# Patient Record
Sex: Male | Born: 1935
Health system: Southern US, Community
[De-identification: ages and names within clinical notes are randomized; demographics above are authoritative.]

## PROBLEM LIST (undated history)

## (undated) DIAGNOSIS — M1990 Unspecified osteoarthritis, unspecified site: Secondary | ICD-10-CM

## (undated) DIAGNOSIS — C801 Malignant (primary) neoplasm, unspecified: Secondary | ICD-10-CM

## (undated) DIAGNOSIS — R0602 Shortness of breath: Secondary | ICD-10-CM

## (undated) HISTORY — PX: APPENDECTOMY: SHX54

## (undated) HISTORY — PX: OTHER SURGICAL HISTORY: SHX169

## (undated) HISTORY — PX: NASAL SINUS SURGERY: SHX719

## (undated) HISTORY — PX: EYE SURGERY: SHX253

---

## 2003-09-26 ENCOUNTER — Ambulatory Visit (HOSPITAL_COMMUNITY): Admission: RE | Admit: 2003-09-26 | Discharge: 2003-09-26 | Payer: Self-pay | Admitting: Gastroenterology

## 2003-09-26 ENCOUNTER — Encounter (INDEPENDENT_AMBULATORY_CARE_PROVIDER_SITE_OTHER): Payer: Self-pay | Admitting: *Deleted

## 2010-04-08 ENCOUNTER — Encounter: Admission: RE | Admit: 2010-04-08 | Discharge: 2010-04-08 | Payer: Self-pay | Admitting: Orthopedic Surgery

## 2010-05-21 ENCOUNTER — Encounter: Admission: RE | Admit: 2010-05-21 | Discharge: 2010-05-21 | Payer: Self-pay | Admitting: Family Medicine

## 2010-05-21 ENCOUNTER — Observation Stay (HOSPITAL_COMMUNITY): Admission: EM | Admit: 2010-05-21 | Discharge: 2010-05-22 | Payer: Self-pay | Admitting: Emergency Medicine

## 2010-11-26 LAB — CBC
MCH: 33.2 pg (ref 26.0–34.0)
MCHC: 35.3 g/dL (ref 30.0–36.0)
RBC: 3.94 MIL/uL — ABNORMAL LOW (ref 4.22–5.81)

## 2010-11-26 LAB — BASIC METABOLIC PANEL
BUN: 16 mg/dL (ref 6–23)
CO2: 23 mEq/L (ref 19–32)
Calcium: 8.5 mg/dL (ref 8.4–10.5)
Chloride: 101 mEq/L (ref 96–112)
Glucose, Bld: 99 mg/dL (ref 70–99)
Potassium: 4.1 mEq/L (ref 3.5–5.1)
Sodium: 131 mEq/L — ABNORMAL LOW (ref 135–145)

## 2010-11-26 LAB — DIFFERENTIAL
Basophils Relative: 0 % (ref 0–1)
Eosinophils Relative: 1 % (ref 0–5)
Monocytes Absolute: 1 10*3/uL (ref 0.1–1.0)

## 2011-01-07 ENCOUNTER — Other Ambulatory Visit: Payer: Self-pay | Admitting: Sports Medicine

## 2011-01-07 DIAGNOSIS — M169 Osteoarthritis of hip, unspecified: Secondary | ICD-10-CM

## 2011-01-08 ENCOUNTER — Ambulatory Visit
Admission: RE | Admit: 2011-01-08 | Discharge: 2011-01-08 | Disposition: A | Discharge status: Home / Self Care | Payer: Medicare PPO | Source: Ambulatory Visit | Attending: Sports Medicine | Admitting: Sports Medicine

## 2011-01-08 DIAGNOSIS — M169 Osteoarthritis of hip, unspecified: Secondary | ICD-10-CM

## 2011-01-29 NOTE — Op Note (Signed)
NAME:  Mark Howe, Mark Howe                           ACCOUNT NO.:  [id number]   MEDICAL RECORD NO.:  [id number]                   PATIENT TYPE:  AMB   LOCATION:  ENDO                                 FACILITY:  [facility]   PHYSICIAN:  Graylin Shiver, M.D.                DATE OF BIRTH:  06-Jun-1935   DATE OF PROCEDURE:  09/26/2003  DATE OF DISCHARGE:                                 OPERATIVE REPORT   INDICATIONS FOR PROCEDURE:  Screening.   PROCEDURE:  Colonoscopy with polypectomy.   ENDOSCOPIST:  Graylin Shiver, M.D.   Informed consent was obtained after an explanation of the risks of the  procedure and preparation.   PREMEDICATION:  Fentanyl 50 mcg IV and Versed 6 mg IV.   PROCEDURE:  With the patient in the left lateral decubitus position a rectal  exam was performed.  No masses were felt.  The Olympus colonoscope was  inserted into the rectum and advanced around the colon to the cecum.  The  cecal landmarks were identified.  The cecum and ascending colon were normal.  The transverse colon was normal.  In the distal descending colon there was a  6 mm sessile polyp which was snared with the mini snare and removed by snare  cautery technique.  The cautery site looked good.  The polyp was retrieved.  The sigmoid and rectum looked normal.  He tolerated the procedure well  without complications.   IMPRESSION:  Small polyp in the descending colon.   PLAN:  Pathology will be checked - diagnosis code 211.3.                                               Graylin Shiver, M.D.    SFG/MEDQ  D:  09/26/2003  T:  09/26/2003  Job:  [id number]   cc:   Duncan Dull, M.D.  [address]  [place]  [place] [address]  Fax: [phone]

## 2011-05-24 ENCOUNTER — Other Ambulatory Visit: Payer: Self-pay | Admitting: Orthopedic Surgery

## 2011-05-24 DIAGNOSIS — M25552 Pain in left hip: Secondary | ICD-10-CM

## 2011-05-25 ENCOUNTER — Ambulatory Visit
Admission: RE | Admit: 2011-05-25 | Discharge: 2011-05-25 | Disposition: A | Discharge status: Home / Self Care | Payer: Medicare PPO | Source: Ambulatory Visit | Attending: Orthopedic Surgery | Admitting: Orthopedic Surgery

## 2011-05-25 DIAGNOSIS — M25552 Pain in left hip: Secondary | ICD-10-CM

## 2011-05-25 MED ORDER — IOHEXOL 180 MG/ML  SOLN: 1.0000 mL | Freq: Once | INTRAMUSCULAR | Status: AC | PRN

## 2011-05-25 MED ORDER — METHYLPREDNISOLONE ACETATE 40 MG/ML INJ SUSP (RADIOLOG: 120.0000 mg | Freq: Once | INTRAMUSCULAR | Status: DC

## 2011-11-16 NOTE — H&P (Signed)
  [facility] [address]   [address] [place], [place] [address] [phone] A Division of [facility]  Loreta Ave, M.D.     Robert A. Thurston Hole, M.D.     Lunette Stands, M.D. Eulas Post, M.D.    Buford Dresser, M.D. Estell Harpin, M.D. Ralene Cork, D.O.          Genene Churn. Barry Dienes, PA-C            Kirstin A. Shepperson, PA-C [place], [place]   RE: Trice, [place]   [id number]      DOB: 1935-01-29 PROGRESS NOTE: 11-12-11 Chief complaint: left hip pain. 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 States hip symptoms unchanged from previous visit. He's wanting to proceed with total hip replacement as scheduled.  Current medications: aspirin vitamin D, fish oil prostate health capsule Flomax Glucosamine. No known drug allergies. Past medical/surgical history: psoriasis allergic rhinitis colonic polyps BPH. Social history: patient quit smoking 2011. Denies alcohol consumption. Is currently employed doing lawn work. Single. Review of systems: denies cardiac pulmonary GI GU or neural issues. Denies fevers chills.  EXAMINATION: Height 5'10" weight 200 pounds. Respirations 20 blood pressure 158/80 pulse 63 temp 97.9. Alert and oriented x3 in no acute distress. Head is normal cephalic atraumatic. PERRLA and EOMI.. Lungs CTA bilaterally. No wheezes noted. Heart regular rate and rhythm S1 S2. No murmurs. Abdomen round non-distended. NABS x4. Soft non-tender. Left hip has 5-10 degrees internal rotation/external rotation with marked discomfort. Calf non-tender neurovascularly intact. Skin warm and dry.  IMPRESSION: End stage degenerative joint disease left hip and chronic pain. Failed conservative treatment.  PLAN: Will proceed with total hip replacement as scheduled. He will go to a rehab center post-op. He has information for this. All  question answered.  Loreta Ave, M.D.  Electronically verified by Loreta Ave, M.D. DFM(JMO):kh D 11-15-01 T 11-15-01

## 2011-11-19 ENCOUNTER — Encounter (HOSPITAL_COMMUNITY)
Admission: RE | Admit: 2011-11-19 | Discharge: 2011-11-19 | Disposition: A | Discharge status: Home / Self Care | Payer: Medicare Other | Source: Ambulatory Visit | Attending: Orthopedic Surgery | Admitting: Orthopedic Surgery

## 2011-11-19 ENCOUNTER — Other Ambulatory Visit: Payer: Self-pay

## 2011-11-19 ENCOUNTER — Ambulatory Visit (HOSPITAL_COMMUNITY)
Admission: RE | Admit: 2011-11-19 | Discharge: 2011-11-19 | Disposition: A | Discharge status: Home / Self Care | Payer: Medicare Other | Source: Ambulatory Visit | Attending: Surgery | Admitting: Surgery

## 2011-11-19 ENCOUNTER — Encounter (HOSPITAL_COMMUNITY): Payer: Self-pay

## 2011-11-19 DIAGNOSIS — Z01818 Encounter for other preprocedural examination: Secondary | ICD-10-CM | POA: Insufficient documentation

## 2011-11-19 DIAGNOSIS — Z01812 Encounter for preprocedural laboratory examination: Secondary | ICD-10-CM | POA: Insufficient documentation

## 2011-11-19 DIAGNOSIS — Z01810 Encounter for preprocedural cardiovascular examination: Secondary | ICD-10-CM | POA: Insufficient documentation

## 2011-11-19 HISTORY — DX: Unspecified osteoarthritis, unspecified site: M19.90

## 2011-11-19 HISTORY — DX: Malignant (primary) neoplasm, unspecified: C80.1

## 2011-11-19 HISTORY — DX: Shortness of breath: R06.02

## 2011-11-19 LAB — COMPREHENSIVE METABOLIC PANEL
AST: 17 U/L (ref 0–37)
Albumin: 3.9 g/dL (ref 3.5–5.2)
Alkaline Phosphatase: 47 U/L (ref 39–117)
Chloride: 102 mEq/L (ref 96–112)
Creatinine, Ser: 1.03 mg/dL (ref 0.50–1.35)
Potassium: 4.8 mEq/L (ref 3.5–5.1)
Total Bilirubin: 1 mg/dL (ref 0.3–1.2)
Total Protein: 7.7 g/dL (ref 6.0–8.3)

## 2011-11-19 LAB — URINALYSIS, ROUTINE W REFLEX MICROSCOPIC
Bilirubin Urine: NEGATIVE
Ketones, ur: NEGATIVE mg/dL
Nitrite: NEGATIVE
Protein, ur: 30 mg/dL — AB
Specific Gravity, Urine: 1.027 (ref 1.005–1.030)
Urobilinogen, UA: 1 mg/dL (ref 0.0–1.0)

## 2011-11-19 LAB — URINE MICROSCOPIC-ADD ON

## 2011-11-19 LAB — PROTIME-INR
INR: 1.05 (ref 0.00–1.49)
Prothrombin Time: 13.9 seconds (ref 11.6–15.2)

## 2011-11-19 LAB — ABO/RH: ABO/RH(D): A POS

## 2011-11-19 LAB — CBC
MCHC: 33.1 g/dL (ref 30.0–36.0)
Platelets: 194 10*3/uL (ref 150–400)
RDW: 12.2 % (ref 11.5–15.5)
WBC: 5.2 10*3/uL (ref 4.0–10.5)

## 2011-11-19 LAB — APTT: aPTT: 33 seconds (ref 24–37)

## 2011-11-19 NOTE — Pre-Procedure Instructions (Signed)
20 Mark Howe  11/19/2011   Your procedure is scheduled on:  11/24/2011  Report to [facility] at 8:00 AM.  Call this number if you have problems the morning of surgery: [phone]   Remember:   Do not eat food:4 Hours before arrival.  May have clear liquids: up to 4 Hours before arrival.  Clear liquids include soda, tea, black coffee, apple or grape juice, broth.  Take these medicines the morning of surgery with A SIP OF WATER: NONE   Do not wear jewelry, make-up or nail polish.  Do not wear lotions, powders, or perfumes. You may wear deodorant.  Do not shave 48 hours prior to surgery.  Do not bring valuables to the hospital.  Contacts, dentures or bridgework may not be worn into surgery.  Leave suitcase in the car. After surgery it may be brought to your room.  For patients admitted to the hospital, checkout time is 11:00 AM the day of discharge.   Patients discharged the day of surgery will not be allowed to drive home.  Name and phone number of your driver: with family  Special Instructions: CHG Shower Use Special Wash: 1/2 bottle night before surgery and 1/2 bottle morning of surgery.   Please read over the following fact sheets that you were given: Pain Booklet, Coughing and Deep Breathing, Blood Transfusion Information, Total Joint Packet, MRSA Information and Surgical Site Infection Prevention

## 2011-11-19 NOTE — Progress Notes (Signed)
Chart given to anesth. For review of CXR

## 2011-11-22 ENCOUNTER — Other Ambulatory Visit: Payer: Medicare Other

## 2011-11-22 ENCOUNTER — Other Ambulatory Visit: Payer: Self-pay | Admitting: Orthopedic Surgery

## 2011-11-22 ENCOUNTER — Ambulatory Visit
Admission: RE | Admit: 2011-11-22 | Discharge: 2011-11-22 | Disposition: A | Discharge status: Home / Self Care | Payer: Medicare Other | Source: Ambulatory Visit | Attending: Orthopedic Surgery | Admitting: Orthopedic Surgery

## 2011-11-22 DIAGNOSIS — R9389 Abnormal findings on diagnostic imaging of other specified body structures: Secondary | ICD-10-CM

## 2011-11-22 MED ORDER — IOHEXOL 300 MG/ML  SOLN
75.0000 mL | Freq: Once | INTRAMUSCULAR | Status: AC | PRN
  Administered 2011-11-22: 75 mL via INTRAVENOUS

## 2011-11-22 NOTE — Consult Note (Addendum)
Anesthesia:  Patient is a 77 year old male scheduled for a left THR on 11/24/11.  History includes SOB, former smoker, arthritis, skin cancer, prior nasal sinus surgery, and lap appendectomy in 2011.  Labs noted.  CXR on 11/19/11 showed Right suprahilar fullness cannot be definitively characterized. CT chest with contrast is recommended for further evaluation.  The Radiologist already notified Dr. Greig Right office, and patient is scheduled for a chest CT on 11/22/11.    EKG on 11/19/11 showed SB @ 57 bpm.  Will follow-up with CT scan results as available.  Addendum: 11/23/11 0940  Chest CT from yesterday showed: 1. The right suprahilar fullness on chest x-ray appears to be due to both a prominent azygos vein as well as mediastinal lipomatosis. No mass or adenopathy is seen.  2. Cardiomegaly.  Plan to proceed.

## 2011-11-23 ENCOUNTER — Other Ambulatory Visit: Payer: Medicare Other

## 2011-11-23 MED ORDER — CEFAZOLIN SODIUM-DEXTROSE 2-3 GM-% IV SOLR
2.0000 g | INTRAVENOUS | Status: AC
  Administered 2011-11-24: 2 g via INTRAVENOUS

## 2011-11-24 ENCOUNTER — Encounter (HOSPITAL_COMMUNITY): Payer: Self-pay | Admitting: Surgery

## 2011-11-24 ENCOUNTER — Encounter (HOSPITAL_COMMUNITY): Payer: Self-pay | Admitting: Vascular Surgery

## 2011-11-24 ENCOUNTER — Ambulatory Visit (HOSPITAL_COMMUNITY): Payer: Medicare Other

## 2011-11-24 ENCOUNTER — Encounter (HOSPITAL_COMMUNITY): Payer: Self-pay | Admitting: *Deleted

## 2011-11-24 ENCOUNTER — Encounter (HOSPITAL_COMMUNITY)
Admission: RE | Disposition: A | Discharge status: Skilled Nursing Facility | Payer: Self-pay | Source: Ambulatory Visit | Attending: Orthopedic Surgery

## 2011-11-24 ENCOUNTER — Ambulatory Visit (HOSPITAL_COMMUNITY): Payer: Medicare Other | Admitting: Vascular Surgery

## 2011-11-24 ENCOUNTER — Inpatient Hospital Stay (HOSPITAL_COMMUNITY)
Admission: RE | Admit: 2011-11-24 | Discharge: 2011-11-27 | DRG: 470 | Disposition: A | Discharge status: Skilled Nursing Facility | Payer: Medicare Other | Source: Ambulatory Visit | Attending: Orthopedic Surgery | Admitting: Orthopedic Surgery

## 2011-11-24 DIAGNOSIS — Z9089 Acquired absence of other organs: Secondary | ICD-10-CM

## 2011-11-24 DIAGNOSIS — Z7901 Long term (current) use of anticoagulants: Secondary | ICD-10-CM

## 2011-11-24 DIAGNOSIS — M1610 Unilateral primary osteoarthritis, unspecified hip: Principal | ICD-10-CM | POA: Diagnosis present

## 2011-11-24 DIAGNOSIS — Z85820 Personal history of malignant melanoma of skin: Secondary | ICD-10-CM

## 2011-11-24 DIAGNOSIS — J9819 Other pulmonary collapse: Secondary | ICD-10-CM | POA: Diagnosis not present

## 2011-11-24 DIAGNOSIS — M169 Osteoarthritis of hip, unspecified: Principal | ICD-10-CM | POA: Diagnosis present

## 2011-11-24 DIAGNOSIS — Z01812 Encounter for preprocedural laboratory examination: Secondary | ICD-10-CM

## 2011-11-24 DIAGNOSIS — L408 Other psoriasis: Secondary | ICD-10-CM | POA: Diagnosis present

## 2011-11-24 DIAGNOSIS — J309 Allergic rhinitis, unspecified: Secondary | ICD-10-CM | POA: Diagnosis present

## 2011-11-24 DIAGNOSIS — I10 Essential (primary) hypertension: Secondary | ICD-10-CM | POA: Diagnosis present

## 2011-11-24 DIAGNOSIS — D126 Benign neoplasm of colon, unspecified: Secondary | ICD-10-CM | POA: Diagnosis present

## 2011-11-24 DIAGNOSIS — N400 Enlarged prostate without lower urinary tract symptoms: Secondary | ICD-10-CM | POA: Diagnosis present

## 2011-11-24 HISTORY — PX: TOTAL HIP ARTHROPLASTY: SHX124

## 2011-11-24 SURGERY — ARTHROPLASTY, HIP, TOTAL,POSTERIOR APPROACH
Anesthesia: General | Site: Hip | Laterality: Left | Wound class: Clean

## 2011-11-24 MED ORDER — ONDANSETRON HCL 4 MG/2ML IJ SOLN
4.0000 mg | Freq: Once | INTRAMUSCULAR | Status: DC | PRN
Start: 2011-11-24 — End: 2011-11-24

## 2011-11-24 MED ORDER — WARFARIN SODIUM 5 MG PO TABS
5.0000 mg | ORAL_TABLET | Freq: Once | ORAL | Status: DC
  Filled 2011-11-24: qty 1

## 2011-11-24 MED ORDER — SODIUM CHLORIDE 0.9 % IR SOLN
Status: DC | PRN
  Administered 2011-11-24: 1

## 2011-11-24 MED ORDER — MENTHOL 3 MG MT LOZG: 1.0000 | LOZENGE | OROMUCOSAL | Status: DC | PRN

## 2011-11-24 MED ORDER — CEFAZOLIN SODIUM 1-5 GM-% IV SOLN
INTRAVENOUS | Status: AC
  Filled 2011-11-24: qty 100

## 2011-11-24 MED ORDER — FENTANYL CITRATE 0.05 MG/ML IJ SOLN
INTRAMUSCULAR | Status: DC | PRN
  Administered 2011-11-24: 50 ug via INTRAVENOUS
  Administered 2011-11-24: 200 ug via INTRAVENOUS

## 2011-11-24 MED ORDER — ENOXAPARIN SODIUM 40 MG/0.4ML ~~LOC~~ SOLN
40.0000 mg | SUBCUTANEOUS | Status: DC
  Administered 2011-11-25 – 2011-11-27 (×3): 40 mg via SUBCUTANEOUS
  Filled 2011-11-24 (×4): qty 0.4

## 2011-11-24 MED ORDER — ONDANSETRON HCL 4 MG PO TABS: 4.0000 mg | ORAL_TABLET | Freq: Four times a day (QID) | ORAL | Status: DC | PRN

## 2011-11-24 MED ORDER — ONDANSETRON HCL 4 MG/2ML IJ SOLN
INTRAMUSCULAR | Status: DC | PRN
  Administered 2011-11-24: 4 mg via INTRAVENOUS

## 2011-11-24 MED ORDER — HETASTARCH-ELECTROLYTES 6 % IV SOLN
INTRAVENOUS | Status: DC | PRN
  Administered 2011-11-24: 11:00:00 via INTRAVENOUS

## 2011-11-24 MED ORDER — OXYCODONE-ACETAMINOPHEN 5-325 MG PO TABS
ORAL_TABLET | ORAL | Status: AC
  Filled 2011-11-24: qty 2

## 2011-11-24 MED ORDER — ACETAMINOPHEN 10 MG/ML IV SOLN
INTRAVENOUS | Status: AC
  Filled 2011-11-24: qty 100

## 2011-11-24 MED ORDER — LACTATED RINGERS IV SOLN
INTRAVENOUS | Status: DC
  Administered 2011-11-24: 10:00:00 via INTRAVENOUS

## 2011-11-24 MED ORDER — OXYCODONE-ACETAMINOPHEN 5-325 MG PO TABS
1.0000 | ORAL_TABLET | ORAL | Status: DC | PRN
  Administered 2011-11-24: 1 via ORAL
  Administered 2011-11-24: 2 via ORAL
  Filled 2011-11-24: qty 1

## 2011-11-24 MED ORDER — METHOCARBAMOL 500 MG PO TABS
500.0000 mg | ORAL_TABLET | Freq: Four times a day (QID) | ORAL | Status: DC | PRN
  Administered 2011-11-24: 500 mg via ORAL
  Filled 2011-11-24: qty 1

## 2011-11-24 MED ORDER — ACETAMINOPHEN 10 MG/ML IV SOLN
INTRAVENOUS | Status: DC | PRN
  Administered 2011-11-24: 1000 mg via INTRAVENOUS

## 2011-11-24 MED ORDER — POTASSIUM CHLORIDE IN NACL 20-0.9 MEQ/L-% IV SOLN
INTRAVENOUS | Status: DC
  Administered 2011-11-24 – 2011-11-25 (×2): via INTRAVENOUS
  Filled 2011-11-24 (×10): qty 1000

## 2011-11-24 MED ORDER — MORPHINE SULFATE 10 MG/ML IJ SOLN
INTRAMUSCULAR | Status: DC | PRN
  Administered 2011-11-24 (×2): 5 mg via INTRAVENOUS

## 2011-11-24 MED ORDER — CEFAZOLIN SODIUM 1-5 GM-% IV SOLN
1.0000 g | Freq: Three times a day (TID) | INTRAVENOUS | Status: AC
  Administered 2011-11-24 – 2011-11-25 (×3): 1 g via INTRAVENOUS
  Filled 2011-11-24 (×3): qty 50

## 2011-11-24 MED ORDER — HYDROMORPHONE HCL PF 1 MG/ML IJ SOLN
0.2500 mg | INTRAMUSCULAR | Status: DC | PRN
  Administered 2011-11-24: 0.5 mg via INTRAVENOUS
  Administered 2011-11-24 (×2): 0.25 mg via INTRAVENOUS

## 2011-11-24 MED ORDER — ONDANSETRON HCL 4 MG/2ML IJ SOLN: 4.0000 mg | Freq: Four times a day (QID) | INTRAMUSCULAR | Status: DC | PRN

## 2011-11-24 MED ORDER — LIDOCAINE HCL 1 % IJ SOLN
INTRAMUSCULAR | Status: DC | PRN
  Administered 2011-11-24: 100 mg via INTRADERMAL

## 2011-11-24 MED ORDER — METHOCARBAMOL 100 MG/ML IJ SOLN
500.0000 mg | Freq: Four times a day (QID) | INTRAVENOUS | Status: DC | PRN
  Filled 2011-11-24: qty 5

## 2011-11-24 MED ORDER — ARTIFICIAL TEARS OP OINT
TOPICAL_OINTMENT | OPHTHALMIC | Status: DC | PRN
  Administered 2011-11-24: 1 via OPHTHALMIC

## 2011-11-24 MED ORDER — PHENOL 1.4 % MT LIQD: 1.0000 | OROMUCOSAL | Status: DC | PRN

## 2011-11-24 MED ORDER — PROPOFOL 10 MG/ML IV EMUL
INTRAVENOUS | Status: DC | PRN
  Administered 2011-11-24: 200 mg via INTRAVENOUS

## 2011-11-24 MED ORDER — FLEET ENEMA 7-19 GM/118ML RE ENEM: 1.0000 | ENEMA | Freq: Once | RECTAL | Status: AC | PRN

## 2011-11-24 MED ORDER — LACTATED RINGERS IV SOLN
INTRAVENOUS | Status: DC | PRN
  Administered 2011-11-24 (×2): via INTRAVENOUS

## 2011-11-24 MED ORDER — ACETAMINOPHEN 325 MG PO TABS
650.0000 mg | ORAL_TABLET | Freq: Four times a day (QID) | ORAL | Status: DC | PRN
  Administered 2011-11-27: 650 mg via ORAL
  Filled 2011-11-24: qty 2

## 2011-11-24 MED ORDER — WARFARIN VIDEO: Freq: Once | Status: DC

## 2011-11-24 MED ORDER — TAMSULOSIN HCL 0.4 MG PO CAPS
0.4000 mg | ORAL_CAPSULE | Freq: Every day | ORAL | Status: DC
  Administered 2011-11-24 – 2011-11-27 (×4): 0.4 mg via ORAL
  Filled 2011-11-24 (×4): qty 1

## 2011-11-24 MED ORDER — SENNOSIDES-DOCUSATE SODIUM 8.6-50 MG PO TABS
1.0000 | ORAL_TABLET | Freq: Every evening | ORAL | Status: DC | PRN
  Administered 2011-11-26 (×2): 1 via ORAL
  Filled 2011-11-24 (×2): qty 1

## 2011-11-24 MED ORDER — WARFARIN - PHARMACIST DOSING INPATIENT
Freq: Every day | Status: DC
  Administered 2011-11-24 – 2011-11-26 (×2)

## 2011-11-24 MED ORDER — DOCUSATE SODIUM 100 MG PO CAPS
100.0000 mg | ORAL_CAPSULE | Freq: Two times a day (BID) | ORAL | Status: DC
  Administered 2011-11-24 – 2011-11-27 (×6): 100 mg via ORAL
  Filled 2011-11-24 (×7): qty 1

## 2011-11-24 MED ORDER — COUMADIN BOOK
Freq: Once | Status: AC
  Administered 2011-11-24: 15:00:00
  Filled 2011-11-24: qty 1

## 2011-11-24 MED ORDER — GLYCOPYRROLATE 0.2 MG/ML IJ SOLN
INTRAMUSCULAR | Status: DC | PRN
  Administered 2011-11-24: .6 mg via INTRAVENOUS
  Administered 2011-11-24: 0.2 mg via INTRAVENOUS

## 2011-11-24 MED ORDER — BUPIVACAINE HCL 0.25 % IJ SOLN
INTRAMUSCULAR | Status: DC | PRN
  Administered 2011-11-24: 20 mL

## 2011-11-24 MED ORDER — ROCURONIUM BROMIDE 100 MG/10ML IV SOLN
INTRAVENOUS | Status: DC | PRN
  Administered 2011-11-24: 50 mg via INTRAVENOUS

## 2011-11-24 MED ORDER — ACETAMINOPHEN 650 MG RE SUPP: 650.0000 mg | Freq: Four times a day (QID) | RECTAL | Status: DC | PRN

## 2011-11-24 MED ORDER — NEOSTIGMINE METHYLSULFATE 1 MG/ML IJ SOLN
INTRAMUSCULAR | Status: DC | PRN
  Administered 2011-11-24: 3 mg via INTRAVENOUS

## 2011-11-24 MED ORDER — HYDROMORPHONE HCL PF 1 MG/ML IJ SOLN: 0.5000 mg | INTRAMUSCULAR | Status: DC | PRN

## 2011-11-24 MED ORDER — EPHEDRINE SULFATE 50 MG/ML IJ SOLN
INTRAMUSCULAR | Status: DC | PRN
  Administered 2011-11-24 (×2): 10 mg via INTRAVENOUS
  Administered 2011-11-24: 5 mg via INTRAVENOUS

## 2011-11-24 SURGICAL SUPPLY — 63 items
BLADE SAW SAG 73X25 THK (BLADE) ×1
BLADE SAW SGTL 73X25 THK (BLADE) ×1 IMPLANT
BOOTCOVER CLEANROOM LRG (PROTECTIVE WEAR) ×4 IMPLANT
BRUSH FEMORAL CANAL (MISCELLANEOUS) IMPLANT
CLOTH BEACON ORANGE TIMEOUT ST (SAFETY) ×2 IMPLANT
COVER BACK TABLE 24X17X13 BIG (DRAPES) ×1 IMPLANT
COVER SURGICAL LIGHT HANDLE (MISCELLANEOUS) ×2 IMPLANT
DRAPE INCISE IOBAN 66X45 STRL (DRAPES) ×1 IMPLANT
DRAPE ORTHO SPLIT 77X108 STRL (DRAPES) ×4
DRAPE SURG ORHT 6 SPLT 77X108 (DRAPES) ×2 IMPLANT
DRAPE U-SHAPE 47X51 STRL (DRAPES) ×2 IMPLANT
DRILL BIT 7/64X5 (BIT) ×2 IMPLANT
DRSG PAD ABDOMINAL 8X10 ST (GAUZE/BANDAGES/DRESSINGS) ×3 IMPLANT
DURAPREP 26ML APPLICATOR (WOUND CARE) ×2 IMPLANT
ELECT BLADE 6.5 EXT (BLADE) ×1 IMPLANT
ELECT CAUTERY BLADE 6.4 (BLADE) ×2 IMPLANT
ELECT REM PT RETURN 9FT ADLT (ELECTROSURGICAL) ×2
ELECTRODE REM PT RTRN 9FT ADLT (ELECTROSURGICAL) ×1 IMPLANT
EVACUATOR 1/8 PVC DRAIN (DRAIN) IMPLANT
FACESHIELD LNG OPTICON STERILE (SAFETY) ×4 IMPLANT
GAUZE XEROFORM 1X8 LF (GAUZE/BANDAGES/DRESSINGS) ×1 IMPLANT
GAUZE XEROFORM 5X9 LF (GAUZE/BANDAGES/DRESSINGS) ×2 IMPLANT
GLOVE BIOGEL PI IND STRL 8 (GLOVE) ×1 IMPLANT
GLOVE BIOGEL PI INDICATOR 8 (GLOVE) ×1
GLOVE ORTHO TXT STRL SZ7.5 (GLOVE) ×4 IMPLANT
GOWN PREVENTION PLUS XLARGE (GOWN DISPOSABLE) ×2 IMPLANT
GOWN STRL NON-REIN LRG LVL3 (GOWN DISPOSABLE) ×2 IMPLANT
GOWN STRL REIN 2XL XLG LVL4 (GOWN DISPOSABLE) ×2 IMPLANT
HANDPIECE INTERPULSE COAX TIP (DISPOSABLE)
KIT BASIN OR (CUSTOM PROCEDURE TRAY) ×2 IMPLANT
KIT ROOM TURNOVER OR (KITS) ×2 IMPLANT
MANIFOLD NEPTUNE II (INSTRUMENTS) ×2 IMPLANT
NDL HYPO 25GX1X1/2 BEV (NEEDLE) ×1 IMPLANT
NEEDLE HYPO 25GX1X1/2 BEV (NEEDLE) ×2 IMPLANT
NS IRRIG 1000ML POUR BTL (IV SOLUTION) ×2 IMPLANT
PACK TOTAL JOINT (CUSTOM PROCEDURE TRAY) ×2 IMPLANT
PAD ARMBOARD 7.5X6 YLW CONV (MISCELLANEOUS) ×4 IMPLANT
PASSER SUT SWANSON 36MM LOOP (INSTRUMENTS) ×2 IMPLANT
PILLOW ABDUCTION HIP (SOFTGOODS) ×2 IMPLANT
PRESSURIZER FEMORAL UNIV (MISCELLANEOUS) IMPLANT
SET HNDPC FAN SPRY TIP SCT (DISPOSABLE) IMPLANT
SPONGE GAUZE 4X4 12PLY (GAUZE/BANDAGES/DRESSINGS) ×2 IMPLANT
SPONGE LAP 4X18 X RAY DECT (DISPOSABLE) IMPLANT
STAPLER VISISTAT 35W (STAPLE) ×2 IMPLANT
SUCTION FRAZIER TIP 10 FR DISP (SUCTIONS) ×2 IMPLANT
SUT FIBERWIRE #2 38 REV NDL BL (SUTURE) ×6
SUT VIC AB 1 CTX 36 (SUTURE) ×8
SUT VIC AB 1 CTX36XBRD ANBCTR (SUTURE) ×4 IMPLANT
SUT VIC AB 2-0 CT1 27 (SUTURE)
SUT VIC AB 2-0 CT1 TAPERPNT 27 (SUTURE) IMPLANT
SUT VIC AB 2-0 SH 27 (SUTURE) ×4
SUT VIC AB 2-0 SH 27XBRD (SUTURE) ×2 IMPLANT
SUT VIC AB 3-0 SH 27 (SUTURE)
SUT VIC AB 3-0 SH 27X BRD (SUTURE) IMPLANT
SUTURE FIBERWR#2 38 REV NDL BL (SUTURE) ×3 IMPLANT
SYR 30ML SLIP (SYRINGE) ×2 IMPLANT
SYR CONTROL 10ML LL (SYRINGE) ×2 IMPLANT
TAPE CLOTH SURG 4X10 WHT LF (GAUZE/BANDAGES/DRESSINGS) ×1 IMPLANT
TOWEL OR 17X24 6PK STRL BLUE (TOWEL DISPOSABLE) ×2 IMPLANT
TOWEL OR 17X26 10 PK STRL BLUE (TOWEL DISPOSABLE) ×2 IMPLANT
TOWER CARTRIDGE SMART MIX (DISPOSABLE) IMPLANT
TRAY FOLEY CATH 14FR (SET/KITS/TRAYS/PACK) ×2 IMPLANT
WATER STERILE IRR 1000ML POUR (IV SOLUTION) ×8 IMPLANT

## 2011-11-24 NOTE — Preoperative (Signed)
Beta Blockers   Reason not to administer Beta Blockers:Not Applicable 

## 2011-11-24 NOTE — Transfer of Care (Signed)
Immediate Anesthesia Transfer of Care Note  Patient: Mark Howe  Procedure(s) Performed: Procedure(s) (LRB): TOTAL HIP ARTHROPLASTY (Left)  Patient Location: PACU  Anesthesia Type: General  Level of Consciousness: awake, alert , oriented and patient cooperative  Airway & Oxygen Therapy: Patient Spontanous Breathing and Patient connected to nasal cannula oxygen  Post-op Assessment: Report given to PACU RN, Post -op Vital signs reviewed and stable and Patient moving all extremities X 4  Post vital signs: Reviewed and stable  Complications: No apparent anesthesia complications

## 2011-11-24 NOTE — Brief Op Note (Signed)
11/24/2011  12:00 PM  PATIENT:  Mark Howe  77 y.o. male  PRE-OPERATIVE DIAGNOSIS:  OA LEFT HIP  POST-OPERATIVE DIAGNOSIS:  OA LEFT HIP  PROCEDURE:  Procedure(s) (LRB): TOTAL HIP ARTHROPLASTY (Left)  SURGEON:  Surgeon(s) and Role:    * Loreta Ave, MD - Primary  PHYSICIAN ASSISTANT: Zonia Kief M  ANESTHESIA:   general  EBL:  Total I/O In: 1800 [I.V.:1300; IV Piggyback:500] Out: 575 [Urine:75; Blood:500]   SPECIMEN:  No Specimen  DISPOSITION OF SPECIMEN:  N/A  COUNTS:  YES   PATIENT DISPOSITION:  PACU - hemodynamically stable.

## 2011-11-24 NOTE — Interval H&P Note (Signed)
History and Physical Interval Note:  11/24/2011 8:35 AM  Beverely Pace Mascaro  has presented today for surgery, with the diagnosis of OA LEFT HIP  The various methods of treatment have been discussed with the patient and family. After consideration of risks, benefits and other options for treatment, the patient has consented to  Procedure(s) (LRB): TOTAL HIP ARTHROPLASTY (Left) as a surgical intervention .  The patients' history has been reviewed, patient examined, no change in status, stable for surgery.  I have reviewed the patients' chart and labs.  Questions were answered to the patient's satisfaction.     Wauneta Silveria F

## 2011-11-24 NOTE — Progress Notes (Signed)
ANTICOAGULATION CONSULT NOTE - Initial Consult  Pharmacy Consult for coumadin Indication: VTE prophylaxis; s/p L THA  No Known Allergies  Patient Measurements:  Vital Signs: Temp: 97.9 F (36.6 C) (03/13 1300) Temp src: Oral (03/13 0747) BP: 183/72 mmHg (03/13 0747) Pulse Rate: 70  (03/13 0747)  Labs: No results found for this basename: HGB:2,HCT:3,PLT:3,APTT:3,LABPROT:3,INR:3,HEPARINUNFRC:3,CREATININE:3,CKTOTAL:3,CKMB:3,TROPONINI:3 in the last 72 hours CrCl is unknown because there is no height on file for the current visit.  Medical History: Past Medical History  Diagnosis Date  . Shortness of breath   . Arthritis     L hip & R knee- OA  . Cancer     basal cell CA- face    Medications:  Prescriptions prior to admission  Medication Sig Dispense Refill  . aspirin EC 81 MG tablet Take 81 mg by mouth daily.      . fish oil-omega-3 fatty acids 1000 MG capsule Take 1 g by mouth daily.      . Tamsulosin HCl (FLOMAX) 0.4 MG CAPS Take 0.4 mg by mouth daily.        Assessment: 77 yo male s/p L THA to start coumadin. Baseline INR= 1.05 on 11/19/11.  Goal of Therapy:  INR 2-3   Plan:  -Will give 5mg  coumadin today -Daily PT/INR -Will begin education process  Benny Lennert 11/24/2011,2:32 PM

## 2011-11-24 NOTE — Plan of Care (Signed)
Problem: Consults Goal: Diagnosis- Total Joint Replacement Outcome: Completed/Met Date Met:  11/24/11 Primary Total Hip Left

## 2011-11-24 NOTE — Progress Notes (Signed)
Dressing saturated at this time. Pressure dressing and ice applied to L hip. Notified Zonia Kief PA of bleeding. Will continue to monitor bleeding.

## 2011-11-24 NOTE — Anesthesia Preprocedure Evaluation (Addendum)
Anesthesia Evaluation  Patient identified by MRN, date of birth, ID band Patient awake    Reviewed: Allergy & Precautions, H&P , NPO status , Patient's Chart, lab work & pertinent test results  History of Anesthesia Complications (+) DIFFICULT AIRWAY  Airway Mallampati: I TM Distance: >3 FB Neck ROM: full    Dental  (+) Partial Upper, Edentulous Upper, Edentulous Lower and Dental Advisory Given   Pulmonary shortness of breath,          Cardiovascular hypertension, Rhythm:regular Rate:Normal     Neuro/Psych    GI/Hepatic negative GI ROS, Neg liver ROS,   Endo/Other  negative endocrine ROS  Renal/GU negative Renal ROS     Musculoskeletal  (+) Arthritis -, Osteoarthritis,    Abdominal (+) + obese,   Peds  Hematology negative hematology ROS (+)   Anesthesia Other Findings   Reproductive/Obstetrics negative OB ROS                          Anesthesia Physical Anesthesia Plan  ASA: II  Anesthesia Plan: General ETT   Post-op Pain Management:    Induction: Intravenous  Airway Management Planned: Oral ETT  Additional Equipment:   Intra-op Plan:   Post-operative Plan: Extubation in OR  Informed Consent: I have reviewed the patients History and Physical, chart, labs and discussed the procedure including the risks, benefits and alternatives for the proposed anesthesia with the patient or authorized representative who has indicated his/her understanding and acceptance.     Plan Discussed with: Anesthesiologist, CRNA and Surgeon  Anesthesia Plan Comments:         Anesthesia Quick Evaluation

## 2011-11-24 NOTE — Anesthesia Postprocedure Evaluation (Signed)
  Anesthesia Post-op Note  Patient: Mark Howe  Procedure(s) Performed: Procedure(s) (LRB): TOTAL HIP ARTHROPLASTY (Left)  Patient Location: PACU  Anesthesia Type: General  Level of Consciousness: awake, alert , oriented and patient cooperative  Airway and Oxygen Therapy: Patient Spontanous Breathing and Patient connected to nasal cannula oxygen  Post-op Pain: mild  Post-op Assessment: Post-op Vital signs reviewed, Patient's Cardiovascular Status Stable, Respiratory Function Stable, Patent Airway, No signs of Nausea or vomiting and Pain level controlled  Post-op Vital Signs: stable  Complications: No apparent anesthesia complications

## 2011-11-24 NOTE — Anesthesia Procedure Notes (Signed)
Procedure Name: Intubation Date/Time: 11/24/2011 10:04 AM Performed by: Leona Singleton A Pre-anesthesia Checklist: Patient identified Patient Re-evaluated:Patient Re-evaluated prior to inductionOxygen Delivery Method: Circle system utilized Preoxygenation: Pre-oxygenation with 100% oxygen Intubation Type: IV induction Ventilation: Mask ventilation without difficulty Laryngoscope Size: Miller and 2 Grade View: Grade I Tube type: Oral Tube size: 7.5 mm Number of attempts: 1 Airway Equipment and Method: Stylet Placement Confirmation: ETT inserted through vocal cords under direct vision,  positive ETCO2 and breath sounds checked- equal and bilateral Secured at: 21 cm Tube secured with: Tape Dental Injury: Teeth and Oropharynx as per pre-operative assessment

## 2011-11-25 ENCOUNTER — Inpatient Hospital Stay (HOSPITAL_COMMUNITY): Payer: Medicare Other

## 2011-11-25 ENCOUNTER — Encounter (HOSPITAL_COMMUNITY): Payer: Self-pay | Admitting: Orthopedic Surgery

## 2011-11-25 LAB — BASIC METABOLIC PANEL
BUN: 21 mg/dL (ref 6–23)
CO2: 29 mEq/L (ref 19–32)
Chloride: 106 mEq/L (ref 96–112)
Creatinine, Ser: 1.1 mg/dL (ref 0.50–1.35)
Glucose, Bld: 111 mg/dL — ABNORMAL HIGH (ref 70–99)
Potassium: 5 mEq/L (ref 3.5–5.1)

## 2011-11-25 LAB — CBC
HCT: 27.9 % — ABNORMAL LOW (ref 39.0–52.0)
MCHC: 34.4 g/dL (ref 30.0–36.0)
RDW: 12.4 % (ref 11.5–15.5)

## 2011-11-25 LAB — PROTIME-INR
INR: 1.17 (ref 0.00–1.49)
Prothrombin Time: 15.1 seconds (ref 11.6–15.2)

## 2011-11-25 MED ORDER — WARFARIN SODIUM 5 MG PO TABS
5.0000 mg | ORAL_TABLET | Freq: Once | ORAL | Status: AC
  Administered 2011-11-25: 5 mg via ORAL
  Filled 2011-11-25: qty 1

## 2011-11-25 MED ORDER — TRAMADOL HCL 50 MG PO TABS
50.0000 mg | ORAL_TABLET | Freq: Four times a day (QID) | ORAL | Status: DC
  Administered 2011-11-25 – 2011-11-26 (×6): 50 mg via ORAL
  Filled 2011-11-25 (×7): qty 1

## 2011-11-25 NOTE — Progress Notes (Signed)
Physical Therapy Evaluation Patient Details Name: Mark Howe MRN: [id number] DOB: 10-Oct-1934 Today's Date: 11/25/2011  Problem List: There is no problem list on file for this patient.   Past Medical History:  Past Medical History  Diagnosis Date  . Shortness of breath   . Arthritis     L hip & R knee- OA  . Cancer     basal cell CA- face   Past Surgical History:  Past Surgical History  Procedure Date  . Appendectomy     [facility]- 2011  . Nasal sinus surgery     [facility]- 1980's   . Variose     varicose vein surgery  . Eye surgery     cataracts removed, laser (bilateral)  . Total hip arthroplasty 11/24/2011    Procedure: TOTAL HIP ARTHROPLASTY;  Surgeon: Loreta Ave, MD;  Location: [facility] OR;  Service: Orthopedics;  Laterality: Left;    PT Assessment/Plan/Recommendation PT Assessment Clinical Impression Statement: Pt presents with a medical diagnosis of left THA with the following impairments/deficits and therapy diagnosis listed below. Pt will benefit from skilled PT in the acute care setting in order to improve safety and functional mobility. (Pt has difficulty following instructions) PT Recommendation/Assessment: Patient will need skilled PT in the acute care venue PT Problem List: Decreased strength;Decreased range of motion;Decreased activity tolerance;Decreased mobility;Decreased knowledge of use of DME;Decreased safety awareness;Decreased knowledge of precautions;Pain PT Therapy Diagnosis : Abnormality of gait;Acute pain PT Plan PT Frequency: 7X/week PT Treatment/Interventions: DME instruction;Gait training;Stair training;Functional mobility training;Therapeutic activities;Therapeutic exercise;Patient/family education PT Recommendation Follow Up Recommendations: Skilled nursing facility Equipment Recommended: Defer to next venue PT Goals  Acute Rehab PT Goals PT Goal Formulation: With patient Time For Goal Achievement: 7 days Pt will go Supine/Side to Sit: with  supervision PT Goal: Supine/Side to Sit - Progress: Goal set today Pt will go Sit to Supine/Side: with supervision PT Goal: Sit to Supine/Side - Progress: Goal set today Pt will go Sit to Stand: with supervision PT Goal: Sit to Stand - Progress: Goal set today Pt will go Stand to Sit: with supervision PT Goal: Stand to Sit - Progress: Goal set today Pt will Transfer Bed to Chair/Chair to Bed: with supervision PT Transfer Goal: Bed to Chair/Chair to Bed - Progress: Goal set today Pt will Ambulate: 51 - 150 feet;with supervision;with rolling walker PT Goal: Ambulate - Progress: Goal set today  PT Evaluation Precautions/Restrictions  Precautions Precautions: Posterior Hip Precaution Booklet Issued: Yes (comment) Restrictions Weight Bearing Restrictions: Yes LLE Weight Bearing: Weight bearing as tolerated Prior Functioning  Home Living Lives With: Alone Receives Help From: Family (sister) Type of Home: House Home Layout: One level Home Access: Stairs to enter Entrance Stairs-Rails: None Entrance Stairs-Number of Steps: 1 Bathroom Shower/Tub: Forensic scientist: Standard Bathroom Accessibility: Yes How Accessible: Accessible via walker Home Adaptive Equipment: Straight cane;Walker - rolling Prior Function Level of Independence: Independent with basic ADLs;Independent with gait;Independent with transfers Able to Take Stairs?: Yes Driving: Yes Vocation: Retired Producer, television/film/video: Awake/alert Overall Cognitive Status: Appears within functional limits for tasks assessed Orientation Level: Oriented X4 Sensation/Coordination Sensation Light Touch: Appears Intact Extremity Assessment RLE Assessment RLE Assessment: Within Functional Limits LLE Assessment LLE Assessment: Exceptions to WFL LLE AROM (degrees) Overall AROM Left Lower Extremity: Deficits;Due to pain;Due to precautions LLE Overall AROM Comments: UTA Hip secondary to pain and  precautions, knee and ankle WFL LLE Strength LLE Overall Strength: Deficits;Due to precautions;Due to pain LLE Overall Strength Comments: UTA Hip due  to pain and precautions, pt able to complete SLR with mod assist. Knee and ankle WFL Mobility (including Balance) Bed Mobility Bed Mobility: Yes Supine to Sit: 2: Max assist;HOB elevated (Comment degrees);With rails Supine to Sit Details (indicate cue type and reason): VC throughout for sequencing. Assist with LLE and trunk control. Pt had difficulty maintaining hip precautions. Sitting - Scoot to Edge of Bed: 3: Mod assist Sitting - Scoot to Edge of Bed Details (indicate cue type and reason): VC for weight shifting. Assist with drawsheet Transfers Transfers: Yes Sit to Stand: From toilet;From bed;With armrests;With upper extremity assist;From elevated surface;1: +2 Total assist;Patient percentage (comment) (Pt 40%) Sit to Stand Details (indicate cue type and reason): VC throughout to maintain hip precautions as well as sequencing. Pt had difficulty maintaining hip precautions and supporting body weight on R side.  Stand to Sit: 3: Mod assist;With upper extremity assist;To toilet;To chair/3-in-1 Stand to Sit Details: VC for hand placement and sequencing to maintain hip precautions. Assist to control descent Ambulation/Gait Ambulation/Gait: Yes Ambulation/Gait Assistance: 3: Mod assist Ambulation/Gait Assistance Details (indicate cue type and reason): Mod assist for stability during ambulation as well as cues for RW safety and to increased L weight bearing Ambulation Distance (Feet): 20 Feet Assistive device: Rolling walker Gait Pattern: Step-to pattern;Decreased step length - right;Decreased stance time - left;Decreased hip/knee flexion - left;Decreased weight shift to left Gait velocity: Decreased gait speed Stairs: No    Exercise  Total Joint Exercises Ankle Circles/Pumps: AROM;Strengthening;Both;10 reps;Supine Quad Sets:  AROM;Left;Strengthening;10 reps;Supine End of Session PT - End of Session Equipment Utilized During Treatment: Gait belt Activity Tolerance: Patient tolerated treatment well General Behavior During Session: [facility] for tasks performed Cognition: [facility] for tasks performed  Milana Kidney 11/25/2011, 11:00 AM  11/25/2011 Milana Kidney DPT PAGER: [phone] OFFICE: [phone]

## 2011-11-25 NOTE — Progress Notes (Signed)
Subjective: Patient sitting on side of bed c/o difficulty breathing "through my nose".  States that he occasionally uses Afrin at home for nasal congestion.  RN reports possible wheezing/rhonchi left lower lung fields.  Pt denies chest pain.  Hip doing well.  Bleeding from yesterday now controlled.   Objective: Vital signs in last 24 hours: Temp:  [97.2 F (36.2 C)-98.1 F (36.7 C)] 97.6 F (36.4 C) (03/14 0620) Pulse Rate:  [68-101] 101  (03/14 0620) Resp:  [18-20] 20  (03/14 0620) BP: (122-173)/(64-93) 173/93 mmHg (03/14 0620) SpO2:  [95 %-97 %] 95 % (03/14 0620)  Intake/Output from previous day: 03/13 0701 - 03/14 0700 In: 3800 [P.O.:480; I.V.:2770; IV Piggyback:550] Out: 1575 [Urine:1075; Blood:500] Intake/Output this shift:     Basename 11/25/11 0530  HGB 9.6*    Basename 11/25/11 0530  WBC 8.4  RBC 2.90*  HCT 27.9*  PLT 168    Basename 11/25/11 0530  NA 139  K 5.0  CL 106  CO2 29  BUN 21  CREATININE 1.10  GLUCOSE 111*  CALCIUM 8.7    Basename 11/25/11 0530  LABPT --  INR 1.17    Exam:  Dressing c/d/i.   Calf nontender.  Alert and oriented.  Some labored breathing.  Can't tell if this is just nasal congestion.    Assessment/Plan: Did get portable chest xray but this was not the most reliable and radiologist recommended another film.  Will transfer to [employer] or sat.  D/c percocet.     Laquanna Veazey M 11/25/2011, 10:20 AM

## 2011-11-25 NOTE — Progress Notes (Signed)
Clinical Social Work-CSW received referral for pt to d/c to [facility] as well as pt preference which is [facility]-CSW initiated [facility] for signature and will facilitate d/c to SNF when stable and bed secured-Evolet Salminen-MSW, [phone]

## 2011-11-25 NOTE — Progress Notes (Signed)
ANTICOAGULATION CONSULT NOTE - Follow Up Consult  Pharmacy Consult for coumadin  Indication: VTE prophylaxis; s/p L THA  No Known Allergies  Vital Signs: Temp: 97.6 F (36.4 C) (03/14 0620) BP: 173/93 mmHg (03/14 0620) Pulse Rate: 101  (03/14 0620)  Labs:  Basename 11/25/11 0530  HGB 9.6*  HCT 27.9*  PLT 168  APTT --  LABPROT 15.1  INR 1.17  HEPARINUNFRC --  CREATININE 1.10  CKTOTAL --  CKMB --  TROPONINI --   CrCl is unknown because there is no height on file for the current visit.   Medications:  Scheduled:    .  ceFAZolin (ANCEF) IV  1 g Intravenous Q8H  . coumadin book   Does not apply Once  . docusate sodium  100 mg Oral BID  . enoxaparin  40 mg Subcutaneous Q24H  . oxyCODONE-acetaminophen      . Tamsulosin HCl  0.4 mg Oral Daily  . traMADol  50 mg Oral Q6H  . warfarin   Does not apply Once  . Warfarin - Pharmacist Dosing Inpatient   Does not apply q1800  . DISCONTD: warfarin  5 mg Oral ONCE-1800    Assessment: 77 yo male s/p L THA to start coumadin. Coumadin held per MD 3/13 due to bleeding but this is now controlled. INR= 1.17, hg=9.6 (13.3 on 11/19/11).  Goal of Therapy:  INR 2-3   Plan:  -Will give coumadin 5mg  po today -Daily PT/INR  Benny Lennert 11/25/2011,10:31 AM

## 2011-11-26 LAB — PROTIME-INR
INR: 1.18 (ref 0.00–1.49)
Prothrombin Time: 15.3 seconds — ABNORMAL HIGH (ref 11.6–15.2)

## 2011-11-26 LAB — CBC
HCT: 22.7 % — ABNORMAL LOW (ref 39.0–52.0)
Hemoglobin: 8 g/dL — ABNORMAL LOW (ref 13.0–17.0)
MCHC: 35.2 g/dL (ref 30.0–36.0)
RBC: 2.4 MIL/uL — ABNORMAL LOW (ref 4.22–5.81)
WBC: 8 10*3/uL (ref 4.0–10.5)

## 2011-11-26 LAB — BASIC METABOLIC PANEL
BUN: 32 mg/dL — ABNORMAL HIGH (ref 6–23)
CO2: 25 mEq/L (ref 19–32)
Chloride: 106 mEq/L (ref 96–112)
GFR calc non Af Amer: 51 mL/min — ABNORMAL LOW (ref >90)
Glucose, Bld: 116 mg/dL — ABNORMAL HIGH (ref 70–99)
Potassium: 4.1 mEq/L (ref 3.5–5.1)
Sodium: 138 mEq/L (ref 135–145)

## 2011-11-26 MED ORDER — WARFARIN SODIUM 5 MG PO TABS
5.0000 mg | ORAL_TABLET | Freq: Once | ORAL | Status: AC
  Administered 2011-11-26: 5 mg via ORAL
  Filled 2011-11-26: qty 1

## 2011-11-26 NOTE — Evaluation (Addendum)
Occupational Therapy Evaluation Patient Details Name: Mark Howe MRN: [id number] DOB: 1934/11/08 Today's Date: 11/26/2011 14:12-14:57  evII Problem List: There is no problem list on file for this patient.   Past Medical History:  Past Medical History  Diagnosis Date  . Shortness of breath   . Arthritis     L hip & R knee- OA  . Cancer     basal cell CA- face   Past Surgical History:  Past Surgical History  Procedure Date  . Appendectomy     [facility]- 2011  . Nasal sinus surgery     [facility]- [facility]'s   . Variose     varicose vein surgery  . Eye surgery     cataracts removed, laser (bilateral)  . Total hip arthroplasty 11/24/2011    Procedure: TOTAL HIP ARTHROPLASTY;  Surgeon: Loreta Ave, MD;  Location: [facility] OR;  Service: Orthopedics;  Laterality: Left;    OT Assessment/Plan/Recommendation OT Assessment Clinical Impression Statement: Pleasant 77 yr old male admitted for elective left THA.  Pt now with increased dependency with basic selfcare tasks will benefit from acute OT services to address these deficits.  Plan for transition to SNF for further rehab secondary to not having any assistance at home.   OT Recommendation/Assessment: Patient will need skilled OT in the acute care venue OT Problem List: Decreased strength;Impaired balance (sitting and/or standing);Decreased knowledge of use of DME or AE;Decreased knowledge of precautions;Pain;Decreased safety awareness Barriers to Discharge: Decreased caregiver support OT Therapy Diagnosis : Generalized weakness;Acute pain OT Plan OT Frequency: Min 1X/week OT Treatment/Interventions: DME and/or AE instruction;Self-care/ADL training;Therapeutic activities;Patient/family education;Balance training OT Recommendation Follow Up Recommendations: Skilled nursing facility Equipment Recommended: Defer to next venue Individuals Consulted Consulted and Agree with Results and Recommendations: Patient OT Goals Acute Rehab OT Goals OT Goal  Formulation: With patient Time For Goal Achievement: 7 days ADL Goals Pt Will Perform Grooming: with supervision;Standing at sink ADL Goal: Grooming - Progress: Goal set today Pt Will Perform Lower Body Bathing: with min assist;with adaptive equipment;Sit to stand from chair;Sit to stand from bed ADL Goal: Lower Body Bathing - Progress: Goal set today Pt Will Perform Lower Body Dressing: with min assist;Sit to stand from chair;Sit to stand from bed;with adaptive equipment ADL Goal: Lower Body Dressing - Progress: Goal set today Pt Will Transfer to Toilet: with min assist;with DME;3-in-1;Ambulation ADL Goal: Toilet Transfer - Progress: Goal set today Miscellaneous OT Goals Miscellaneous OT Goal #1: Pt will recall 3/3 THR precautions with min questioning cues.   OT Goal: Miscellaneous Goal #1 - Progress: Goal set today  OT Evaluation Precautions/Restrictions  Precautions Precautions: Posterior Hip Precaution Booklet Issued: No Required Braces or Orthoses: No Restrictions Weight Bearing Restrictions: No LLE Weight Bearing: Weight bearing as tolerated Prior Functioning Home Living Lives With: Alone Receives Help From: Family (sister) Type of Home: House Home Layout: One level Home Access: Stairs to enter Entrance Stairs-Rails: None Entrance Stairs-Number of Steps: 1 Bathroom Shower/Tub: Forensic scientist: Standard Bathroom Accessibility: Yes How Accessible: Accessible via walker Home Adaptive Equipment: Straight cane;Walker - rolling Prior Function Level of Independence: Independent with basic ADLs;Independent with gait;Independent with transfers Driving: Yes Vocation: Retired ADL ADL Eating/Feeding: Simulated;Set up Where Assessed - Eating/Feeding: Chair Grooming: Simulated;Minimal assistance Where Assessed - Grooming: Standing at sink Upper Body Bathing: Simulated;Set up Where Assessed - Upper Body Bathing: Sitting, chair Lower Body Bathing:  Simulated;Moderate assistance Where Assessed - Lower Body Bathing: Sit to stand from chair Upper Body Dressing: Simulated;Set up  Where Assessed - Upper Body Dressing: Sitting, chair Lower Body Dressing: Simulated;Maximal assistance Where Assessed - Lower Body Dressing: Sit to stand from chair Toilet Transfer: Performed;Moderate assistance Toilet Transfer Method: Proofreader: Bedside commode Toileting - Clothing Manipulation: Performed;Moderate assistance Where Assessed - Toileting Clothing Manipulation: Sit to stand from 3-in-1 or toilet Toileting - Hygiene: Simulated;Moderate assistance Where Assessed - Toileting Hygiene: Sit to stand from 3-in-1 or toilet Tub/Shower Transfer: Not assessed Tub/Shower Transfer Method: Not assessed Equipment Used: Sock aid;Rolling walker Ambulation Related to ADLs: Pt min assist for ambulation with RW to the hallway and back to the bedside toilet. ADL Comments: Pt able to state 1/3 THR precautions.  Required mod assist for sit to stand from bedside chair.  Began introduction of AE needed for LB selfcare but pt will need extensive practice.  Plan for discharge to SNF for follow-up rehab.   Vision/Perception  Vision - History Baseline Vision: No visual deficits Patient Visual Report: No change from baseline Perception Perception: Within Functional Limits Praxis Praxis: Intact Cognition Cognition Arousal/Alertness: Awake/alert Overall Cognitive Status: Appears within functional limits for tasks assessed Orientation Level: Oriented X4 Sensation/Coordination Sensation Light Touch: Appears Intact Stereognosis: Not tested Hot/Cold: Not tested Proprioception: Not tested Coordination Gross Motor Movements are Fluid and Coordinated: Yes Fine Motor Movements are Fluid and Coordinated: Yes Extremity Assessment RUE Assessment RUE Assessment: Within Functional Limits LUE Assessment LUE Assessment: Within Functional  Limits Mobility  Bed Mobility Bed Mobility: No Transfers Transfers: Yes Sit to Stand: 3: Mod assist;With upper extremity assist;From chair/3-in-1 End of Session OT - End of Session Equipment Utilized During Treatment: Gait belt Activity Tolerance: Patient tolerated treatment well Patient left: in chair;with call bell in reach Nurse Communication: Mobility status for transfers General Behavior During Session: [facility] for tasks performed Cognition: [facility] for tasks performed   Sydnei Ohaver OTR/L 11/26/2011, 3:58 PM  Pager number [phone]

## 2011-11-26 NOTE — Discharge Summary (Signed)
NAME:  Mark Howe, Mark Howe NO.:  [id number]  MEDICAL RECORD NO.:  [id number]  LOCATION:  5005                         FACILITY:  [facility]  PHYSICIAN:  Loreta Ave, M.D. DATE OF BIRTH:  23-May-1935  DATE OF ADMISSION:  11/24/2011 DATE OF DISCHARGE:                              DISCHARGE SUMMARY   FINAL DIAGNOSES: 1. Status post left total hip replacement for end-stage degenerative     joint disease. 2. Bibasilar atelectasis. 3. Psoriasis. 4. Allergic rhinitis. 5. Benign prostatic hypertrophy. 6. Colonic polyps.  HISTORY OF PRESENT ILLNESS:  A 77 year old white male with history of end-stage DJD, left hip and chronic pain presented to our office for preop evaluation for total hip replacement.  He had progressively worsening pain with failed response with conservative treatment. Significant decrease in daily activities due to the ongoing complaint.  HOSPITAL COURSE:  On November 24, 2011, the patient was taken to the [facility] OR and a left total hip replacement procedure performed.  Surgeon: Loreta Ave, MD.  Assistant:  Genene Churn. Barry Dienes, [place].  Anesthesia: General.  EBL:  500 mL.  No specimens or cultures.  All counts correct. The patient tolerated anesthesia and surgery well and was transferred to recovery in stable condition.  After arriving to the Orthopedic Unit, pharmacy protocol Coumadin and Lovenox started for DVT prophylaxis.  On November 25, 2011, the patient sitting on side of bed complained about difficulty breathing, "through my nose."  States he occasionally uses Afrin at home for nasal congestion.  RN did report some possible wheezing/rhonchi left lower lung fields.  Chest x-ray ordered, and this did show bibasilar atelectasis.  Encouraged incentive spirometry every hour while awake.  Nurse had called the previous evening stating that the patient was oozing a considerable amount from his left hip.  This is much controlled this morning.  Temp 97.6,  pulse 101, respirations 20, blood pressure 173/93, O2 saturation 92-93% on room air.  Left hip dressing clean, dry, intact.  Calf nontender neurovascularly.  Skin, warm and dry.  Hemoglobin 9.6, hematocrit 27.9.  Electrolytes stable.  INR 1.17.  Plan to transfer to [facility] for rehab.  Discontinued Percocet.  Started Ultram for pain.  November 26, 2011, the patient doing much better today.  Pain controlled.  No complaints of chest pain or shortness of breath.  Temp 97.8, pulse 80, respirations 20, blood pressure 126/64.  Left hip wound looks good and staples intact.  Slight bleeding.  No signs infection.  Calf nontender neurovascularly.  Skin, warm and dry.  Hemoglobin unavailable.  Ready for transfer to [facility] if bed available today.  Saline locked IV.  DISPOSITION:  Transfer to [facility].  CONDITION:  Good and stable.  MEDICATIONS: 1. Ultram 50 mg 1 tab p.o. q.6-8 hours p.r.n. for pain. 2. Robaxin 500 mg 1 tab p.o. q.6 hours p.r.n. for spasms. 3. Lovenox 40 mg 1 subcu injection daily, and discontinue when     Coumadin is therapeutic with INR 2-3. 4. Coumadin per pharmacy protocol.  Maintain INR 2-3 x4 weeks postop     for DVT prophylaxis. 5. Flomax 0.4 mg 1 tab p.o.  daily. 6. Colace 100 mg 1 tab p.o. b.i.d.  INSTRUCTIONS:  While at [facility], the patient will continue work with PT and OT to improve ambulation and hip strengthening.  Weightbearing as tolerated with a walker.  Physical therapist must strictly go over hip precautions with the patient.  Coumadin x4 weeks postop for DVT prophylaxis and discontinue Lovenox when Coumadin is therapeutic with INR 2-3.  Daily dressing changes with 4 x 4 gauze and tape.  Okay to shower, but no tub soaking.  Do not apply any creams or ointments to his incision.  He needs return office visit with Dr. Eulah Pont when he is 2 weeks postop for a recheck.  Call our office immediately if  there are any questions or concerns.  Wear a TED hose until 3-4 weeks postop bilateral legs.     Genene Churn. Denton Meek.   ______________________________ Loreta Ave, M.D.    JMO/MEDQ  D:  11/26/2011  T:  11/26/2011  Job:  [id number]

## 2011-11-26 NOTE — Progress Notes (Signed)
Physical Therapy Treatment Patient Details Name: Mark Howe MRN: [id number] DOB: 1934/12/13 Today's Date: 11/26/2011  PT Assessment/Plan  PT - Assessment/Plan Comments on Treatment Session: Pt admitted s/p left THA and is progressing well.  Pt motivated and able to increase ambulation distance today. PT Plan: Discharge plan remains appropriate;Frequency remains appropriate PT Frequency: 7X/week Follow Up Recommendations: Skilled nursing facility Equipment Recommended: Defer to next venue PT Goals  Acute Rehab PT Goals PT Goal Formulation: With patient Time For Goal Achievement: 7 days PT Goal: Supine/Side to Sit - Progress: Progressing toward goal PT Goal: Sit to Stand - Progress: Progressing toward goal PT Goal: Stand to Sit - Progress: Progressing toward goal PT Goal: Ambulate - Progress: Progressing toward goal  PT Treatment Precautions/Restrictions  Precautions Precautions: Posterior Hip Precaution Booklet Issued: No Required Braces or Orthoses: No Restrictions Weight Bearing Restrictions: Yes LLE Weight Bearing: Weight bearing as tolerated Pain No c/o pain with treatment. Mobility (including Balance) Bed Mobility Bed Mobility: Yes Supine to Sit: 4: Min assist;HOB flat Supine to Sit Details (indicate cue type and reason): Assist for left LE with cues for sequence. Sitting - Scoot to Edge of Bed: 4: Min assist Sitting - Scoot to [place] of Bed Details (indicate cue type and reason): Assist to scoot weight reciprocally with cues for sequence. Transfers Transfers: Yes Sit to Stand: 4: Min assist;With upper extremity assist;From bed Sit to Stand Details (indicate cue type and reason): Assist for balance with cues for safest hand placement and sequence to maintain posterior hip precautions. Stand to Sit: 4: Min assist;With upper extremity assist;To chair/3-in-1 Stand to Sit Details: Assist to slow descent with cues for hand/left LE  placement. Ambulation/Gait Ambulation/Gait: Yes Ambulation/Gait Assistance: 4: Min assist Ambulation/Gait Assistance Details (indicate cue type and reason): Assist for balance with cues for tall posture and safe sequence inside RW. Ambulation Distance (Feet): 70 Feet Assistive device: Rolling walker Gait Pattern: Step-to pattern;Decreased step length - left;Decreased stance time - left;Trunk flexed Gait velocity: Decreased gait speed Stairs: No Wheelchair Mobility Wheelchair Mobility: No  Posture/Postural Control Posture/Postural Control: No significant limitations Balance Balance Assessed: No Exercise  Total Joint Exercises Ankle Circles/Pumps: AROM;Left;10 reps;Supine Quad Sets: AROM;Left;10 reps;Supine Short Arc Quad: AROM;Left;10 reps;Supine Heel Slides: AROM;Left;10 reps;Supine End of Session PT - End of Session Equipment Utilized During Treatment: Gait belt Activity Tolerance: Patient tolerated treatment well Patient left: in chair;with call bell in reach Nurse Communication: Mobility status for transfers;Mobility status for ambulation General Behavior During Session: [facility] for tasks performed Cognition: [facility] for tasks performed  Cephus Shelling 11/26/2011, 11:33 AM  11/26/2011 Cephus Shelling, PT, DPT [phone]

## 2011-11-26 NOTE — Progress Notes (Signed)
PT Treatment Note:   11/26/11 1115  PT Visit Information  Last PT Received On 11/26/11  Precautions  Precautions Posterior Hip  Precaution Booklet Issued No  Required Braces or Orthoses No  Restrictions  Weight Bearing Restrictions Yes  LLE Weight Bearing WBAT  Bed Mobility  Bed Mobility No  Supine to Sit Not tested (comment)  Sitting - Scoot to Edge of Bed Not tested (comment)  Transfers  Transfers Yes  Sit to Stand 4: Min assist;With upper extremity assist;From chair/3-in-1 (Min (guard))  Sit to Stand Details (indicate cue type and reason) Guarding for balance with cues for hand placement.  Stand to Sit 4: Min assist;With upper extremity assist;To chair/3-in-1 (Min (guard))  Stand to Sit Details Guarding for balance with cues for hand/left LE placement.  Ambulation/Gait  Ambulation/Gait Yes  Ambulation/Gait Assistance 4: Min assist  Ambulation/Gait Assistance Details (indicate cue type and reason) Assist for balance with cues to increase step length and encourage step-through sequence.  Ambulation Distance (Feet) 40 Feet  Assistive device Rolling walker  Gait Pattern Step-to pattern;Decreased step length - left;Decreased stance time - left;Trunk flexed  Gait velocity Decreased gait speed  Stairs No  Wheelchair Mobility  Wheelchair Mobility No  Posture/Postural Control  Posture/Postural Control No significant limitations  Balance  Balance Assessed No  Exercises  Exercises Total Joint  Total Joint Exercises  Ankle Circles/Pumps AROM;Left;10 reps;Supine  Quad Sets AROM;Left;10 reps;Supine  Short Arc Quad AROM;Left;10 reps;Supine  Heel Slides AROM;Left;10 reps;Supine  Hip ABduction/ADduction AROM;Left;10 reps;Supine  PT - End of Session  Equipment Utilized During Treatment Gait belt  Activity Tolerance Patient tolerated treatment well  Patient left in chair;with call bell in reach  Nurse Communication Mobility status for transfers;Mobility status for ambulation    General  Behavior During Session [facility] for tasks performed  Cognition [facility] for tasks performed  PT - Assessment/Plan  Comments on Treatment Session Pt admitted s/p left THA and able to tolerate BID ambulation today.  Pt motivated to do well.  PT Plan Discharge plan remains appropriate;Frequency remains appropriate  PT Frequency 7X/week  Follow Up Recommendations Skilled nursing facility  Equipment Recommended Defer to next venue  Acute Rehab PT Goals  PT Goal Formulation With patient  Time For Goal Achievement 7 days  PT Goal: Sit to Stand - Progress Progressing toward goal  PT Goal: Stand to Sit - Progress Progressing toward goal  PT Goal: Ambulate - Progress Progressing toward goal    Pain: No c/o pain with treatment.  11/26/2011 Cephus Shelling, PT, DPT [phone]

## 2011-11-26 NOTE — Progress Notes (Signed)
Patient ID: Mark Howe, male   DOB: 1935/07/11, 77 y.o.   MRN: [id number]   PRIORITY D/C SUMMARY #  [id number]

## 2011-11-26 NOTE — Progress Notes (Signed)
ANTICOAGULATION CONSULT NOTE - Follow Up Consult  Pharmacy Consult for coumadin  Indication: VTE prophylaxis; s/p L THA  No Known Allergies  Vital Signs: Temp: 97.8 F (36.6 C) (03/15 0606) Temp src: Oral (03/15 0606) BP: 126/64 mmHg (03/15 0606) Pulse Rate: 80  (03/15 0606)  Labs:  Basename 11/26/11 0720 11/25/11 1431 11/25/11 0530  HGB 8.0* 9.1* --  HCT 22.7* 26.2* 27.9*  PLT 158 -- 168  APTT -- -- --  LABPROT 15.3* -- 15.1  INR 1.18 -- 1.17  HEPARINUNFRC -- -- --  CREATININE 1.32 -- 1.10  CKTOTAL -- -- --  CKMB -- -- --  TROPONINI -- -- --   CrCl is unknown because there is no height on file for the current visit.   Medications:  Scheduled:     . docusate sodium  100 mg Oral BID  . enoxaparin  40 mg Subcutaneous Q24H  . Tamsulosin HCl  0.4 mg Oral Daily  . traMADol  50 mg Oral Q6H  . warfarin  5 mg Oral ONCE-1800  . warfarin   Does not apply Once  . Warfarin - Pharmacist Dosing Inpatient   Does not apply q1800    Assessment: 77 yo male on warfarin for VTE px s/p L THA. Coumadin held per MD 3/13 due to bleeding and resumed on 3/14 when this was controlled. INR 1.18 << 1.17.  Hgb 8 << 9.6. No mention of further bleeding in MD note today. The patient and sister were educated on warfarin today.   Goal of Therapy:  INR 2-3   Plan:  1. Warfarin 5 mg x 1 dose at 1800 today 2. Will continue to monitor for any signs/symptoms of bleeding and will follow up with PT/INR in the a.m.   Georgina Pillion, PharmD, BCPS Clinical Pharmacist Pager: [phone] 11/26/2011 1:07 PM

## 2011-11-26 NOTE — Progress Notes (Signed)
Clinical Social Work-CSW contacted SNF to determine if able to accept pt today-Coordinator for the day relayed they will be unable to accept pt before Saturday but WOULD be able to accept Saturday. CSW initiated [employer] auth Thursday and faxed d/c summary to [facility] Friday along with [facility]. Weekend staff is able to accept pt and CSW will relay to weekend coverage-This CSW able to secure [employer] from [employer]- who provided ambulance authorization as well as facility auth. Cedric unable to provide auth directly to SNF as he could not get a rep on phone-he did relay that pt was approved and he would contact SNF on Monday to provide authorization. Weekend CSW coverage will have all relevant information in order to facilitate d/c [employer], [phone]

## 2011-11-26 NOTE — Progress Notes (Signed)
Subjective: Doing well. Pain controlled.  Denies cp, sob.    Objective: Vital signs in last 24 hours: Temp:  [97.8 F (36.6 C)] 97.8 F (36.6 C) (03/15 0606) Pulse Rate:  [80-86] 80  (03/15 0606) Resp:  [20] 20  (03/15 0606) BP: (112-126)/(52-64) 126/64 mmHg (03/15 0606) SpO2:  [97 %-98 %] 98 % (03/15 0606)  Intake/Output from previous day: 03/14 0701 - 03/15 0700 In: 720 [P.O.:360; I.V.:360] Out: -  Intake/Output this shift:     Basename 11/25/11 1431 11/25/11 0530  HGB 9.1* 9.6*    Basename 11/25/11 1431 11/25/11 0530  WBC -- 8.4  RBC -- 2.90*  HCT 26.2* 27.9*  PLT -- 168    Basename 11/25/11 0530  NA 139  K 5.0  CL 106  CO2 29  BUN 21  CREATININE 1.10  GLUCOSE 111*  CALCIUM 8.7    Basename 11/25/11 0530  LABPT --  INR 1.17    Neurovascular intact.  Wound looks good, staples intact.  No signs of infection.  Calf nt.    Assessment/Plan: Transfer to [facility] today if bed available.  Saline lock iv.  Dressing change.   Mark Howe M 11/26/2011, 8:20 AM

## 2011-11-26 NOTE — Progress Notes (Signed)
Pt spilling urine from urinal and becomes upset when having to change his linens. Condom cath placed. Pt content.

## 2011-11-26 NOTE — Op Note (Signed)
Mark Howe, Mark Howe NO.:  [id number]  MEDICAL RECORD NO.:  [id number]  LOCATION:  5005                         FACILITY:  [facility]  PHYSICIAN:  Loreta Ave, M.D. DATE OF BIRTH:  02/11/1935  DATE OF PROCEDURE:  11/24/2011 DATE OF DISCHARGE:                              OPERATIVE REPORT   PREOPERATIVE DIAGNOSIS:  Left hip end-stage degenerative arthritis.  POSTOPERATIVE DIAGNOSE:  Left hip end-stage degenerative arthritis.  PROCEDURE:  Left total hip replacement utilizing Stryker prosthesis.  A press-fit 58-mm acetabular component screw fixation x2.  36-mm internal diameter polyethylene liner with 10-degree overhang.  A Secur-Fit #10 femoral component.  127-degree neck angle.  A 36, -2.5 Biolox head.  SURGEON:  Loreta Ave, MD  ASSISTANT:  Genene Churn. Denton Meek., present throughout the entire case and necessary for the timely completion of the procedure.  ANESTHESIA:  General.  BLOOD LOSS:  350 mL.  BLOOD GIVEN:  None.  SPECIMENS:  None.  CULTURES:  None.  COMPLICATIONS:  None.  DRESSINGS:  Soft compressive with abduction pillow.  PROCEDURE:  The patient was brought to the operating room and placed on the operating table in supine position.  After adequate anesthesia had been obtained, turned to a lateral position, left side up.  Prepped and draped in usual sterile fashion.  Incision along the shaft of femur extending posterosuperior.  Skin and subcutaneous tissue divided. Hemostasis with cautery.  Iliotibial band exposed, incised.  Charnley retractor put in place.  Neurovascular structures identified and protected throughout.  External rotator and capsule taken down off the back of the intertrochanteric groove of the femur.  Dislocated posteriorly.  Grade 4 change throughout.  Femoral neck cut 1 fingerbreadth above the lesser trochanter.  Acetabulum exposed. Numerous loose bodies, chondral debris removed throughout.  Brought up to good  bleeding bone.  Fitted with a 58-mm component and hammered into place at 45 degrees of abduction, 20 degrees of anteversion.  Excellent capturing and fixation.  Augmented with 2 screws, pre drilled and placed through the cup.  36-mm internal diameter liner placed with a 10-degree overhang posterosuperior.  Attention was turned to the femur.  Proximal distal reaming with the reamers and rasps brought up to good sizing proximally and distally.  Fitted with a #10 Secur-Fit component.  After appropriate trials, the 36, -2.5 Biolox head attached.  Hip reduced. Excellent stability in flexion, extension.  Equal leg lengths confirmed. Wound thoroughly irrigated.  External rotator and capsule repaired to the back of the intertrochanteric groove through drill holes, tied over bony bridge.  Charnley retractor removed.  Wound irrigated.  Iliotibial band closed with #1 Vicryl, skin and subcutaneous tissue with Vicryl and staples.  Margins were injected with Marcaine.  Sterile compressive dressing applied.  Returned to supine position.  Abduction pillow placed.  Anesthesia reversed.  Brought to recovery room.  Tolerated the surgery well.  No complications.     Loreta Ave, M.D.     DFM/MEDQ  D:  11/25/2011  T:  11/26/2011  Job:  [id number]

## 2011-11-27 LAB — CBC
HCT: 23.2 % — ABNORMAL LOW (ref 39.0–52.0)
Platelets: 177 10*3/uL (ref 150–400)
RBC: 2.4 MIL/uL — ABNORMAL LOW (ref 4.22–5.81)
RDW: 12.6 % (ref 11.5–15.5)
WBC: 6.5 10*3/uL (ref 4.0–10.5)

## 2011-11-27 LAB — BASIC METABOLIC PANEL
BUN: 33 mg/dL — ABNORMAL HIGH (ref 6–23)
CO2: 26 mEq/L (ref 19–32)
Chloride: 103 mEq/L (ref 96–112)
GFR calc Af Amer: 68 mL/min — ABNORMAL LOW (ref >90)
Potassium: 4.6 mEq/L (ref 3.5–5.1)

## 2011-11-27 LAB — PREPARE RBC (CROSSMATCH)

## 2011-11-27 LAB — PROTIME-INR: INR: 1.24 (ref 0.00–1.49)

## 2011-11-27 MED ORDER — FUROSEMIDE 10 MG/ML IJ SOLN
20.0000 mg | Freq: Once | INTRAMUSCULAR | Status: AC
  Administered 2011-11-27: 20 mg via INTRAVENOUS
  Filled 2011-11-27: qty 2

## 2011-11-27 MED ORDER — WARFARIN SODIUM 7.5 MG PO TABS
7.5000 mg | ORAL_TABLET | Freq: Once | ORAL | Status: DC
  Filled 2011-11-27: qty 1

## 2011-11-27 MED ORDER — SALINE SPRAY 0.65 % NA SOLN
1.0000 | NASAL | Status: DC | PRN
  Filled 2011-11-27: qty 44

## 2011-11-27 NOTE — Progress Notes (Signed)
PT Cancellation Note  Treatment cancelled today due to medical issues with patient which prohibited therapy. Pt with low hgb and receiving two units of PRBC's today. Fayrene Fearing, PA-C requested hold till after blood transfusion completed and if pt doesn't discharge to snf today.  Sallyanne Kuster 11/27/2011, 11:30 AM  Sallyanne Kuster, PTA Office- [phone] Pager- [phone]

## 2011-11-27 NOTE — Progress Notes (Signed)
ANTICOAGULATION CONSULT NOTE - Follow Up Consult  Pharmacy Consult for coumadin  Indication: VTE prophylaxis; s/p L THA  No Known Allergies  Vital Signs: Temp: 97.8 F (36.6 C) (03/16 0945) Temp src: Oral (03/16 0509) BP: 143/62 mmHg (03/16 0945) Pulse Rate: 86  (03/16 0945)  Labs:  Basename 11/27/11 0717 11/26/11 0720 11/25/11 1431 11/25/11 0530  HGB 7.9* 8.0* -- --  HCT 23.2* 22.7* 26.2* --  PLT 177 158 -- 168  APTT -- -- -- --  LABPROT 15.9* 15.3* -- 15.1  INR 1.24 1.18 -- 1.17  HEPARINUNFRC -- -- -- --  CREATININE 1.19 1.32 -- 1.10  CKTOTAL -- -- -- --  CKMB -- -- -- --  TROPONINI -- -- -- --   CrCl is unknown because there is no height on file for the current visit.   Medications:  Scheduled:     . docusate sodium  100 mg Oral BID  . enoxaparin  40 mg Subcutaneous Q24H  . furosemide  20 mg Intravenous Once  . Tamsulosin HCl  0.4 mg Oral Daily  . traMADol  50 mg Oral Q6H  . warfarin  5 mg Oral ONCE-1800  . warfarin   Does not apply Once  . Warfarin - Pharmacist Dosing Inpatient   Does not apply q1800    Assessment: 77 yo male on warfarin for VTE px s/p L THA. Coumadin held per MD 3/13 due to bleeding and resumed on 3/14 when this was controlled. INR 1.24 with minimal trend up..  Hgb 7.9 and noted plans for transfusion (no bleeding noted). Possible d/c today.  Goal of Therapy:  INR 2-3   Plan:  1. Warfarin 7.5 mg x 1 dose at 1800 today 2. Will continue to monitor for any signs/symptoms of bleeding and will follow up with PT/INR in the a.m.   Harland German, Pharm D 11/27/2011 10:23 AM

## 2011-11-27 NOTE — Progress Notes (Signed)
Pt to transfer to [facility] today via [name]. CSW provided [name] # [id number] option 3, 3) to pt's RN who will call for transport when pt ready. SNF packet in pt's wall-a-roo. No other CSW needs reported or noted. CSW signing off.  Dellie Burns, MSW, [place] [phone] (weekend)

## 2011-11-27 NOTE — Progress Notes (Signed)
Subjective: Pain controlled.  Denies cp, sob.  Continues to have nasal congestion.  This is a chronic issue and he doesn't know what he uses at home.    Objective: Vital signs in last 24 hours: Temp:  [97 F (36.1 C)-98.5 F (36.9 C)] 97.2 F (36.2 C) (03/16 0509) Pulse Rate:  [66-84] 81  (03/16 0509) Resp:  [16-20] 20  (03/16 0509) BP: (98-150)/(43-91) 150/66 mmHg (03/16 0509) SpO2:  [92 %-100 %] 100 % (03/16 0509)  Intake/Output from previous day: 03/15 0701 - 03/16 0700 In: 240 [P.O.:240] Out: 450 [Urine:450] Intake/Output this shift:     Basename 11/27/11 0717 11/26/11 0720 11/25/11 1431 11/25/11 0530  HGB 7.9* 8.0* 9.1* 9.6*    Basename 11/27/11 0717 11/26/11 0720  WBC 6.5 8.0  RBC 2.40* 2.40*  HCT 23.2* 22.7*  PLT 177 158    Basename 11/26/11 0720 11/25/11 0530  NA 138 139  K 4.1 5.0  CL 106 106  CO2 25 29  BUN 32* 21  CREATININE 1.32 1.10  GLUCOSE 116* 111*  CALCIUM 8.7 8.7    Basename 11/27/11 0717 11/26/11 0720  LABPT -- --  INR 1.24 1.18    Wound ok.  Mild-mod serosang drainage.  No signs of infection.  Calf nt, nvi.   Assessment/Plan: Abla.  Transfuse 2 units prbc's this morning and transfer to [facility] this afternoon.   Nasal congestion.  Try saline nasal spray.    Venola Castello M 11/27/2011, 8:59 AM

## 2011-11-28 LAB — TYPE AND SCREEN: Antibody Screen: NEGATIVE

## 2011-12-14 ENCOUNTER — Other Ambulatory Visit: Payer: Self-pay | Admitting: *Deleted

## 2011-12-14 ENCOUNTER — Encounter (INDEPENDENT_AMBULATORY_CARE_PROVIDER_SITE_OTHER): Payer: Medicare Other

## 2011-12-14 DIAGNOSIS — M7989 Other specified soft tissue disorders: Secondary | ICD-10-CM

## 2011-12-14 DIAGNOSIS — M79609 Pain in unspecified limb: Secondary | ICD-10-CM

## 2012-04-05 ENCOUNTER — Other Ambulatory Visit: Payer: Self-pay | Admitting: Dermatology

## 2012-08-22 ENCOUNTER — Other Ambulatory Visit: Payer: Self-pay | Admitting: Dermatology

## 2012-12-11 ENCOUNTER — Other Ambulatory Visit: Payer: Self-pay | Admitting: Dermatology

## 2013-05-17 ENCOUNTER — Other Ambulatory Visit: Payer: Self-pay | Admitting: Dermatology

## 2015-06-18 ENCOUNTER — Other Ambulatory Visit: Payer: Self-pay | Admitting: Dermatology

## 2015-11-03 DIAGNOSIS — Z0000 Encounter for general adult medical examination without abnormal findings: Secondary | ICD-10-CM | POA: Diagnosis not present

## 2015-11-03 DIAGNOSIS — N138 Other obstructive and reflux uropathy: Secondary | ICD-10-CM | POA: Diagnosis not present

## 2015-11-03 DIAGNOSIS — N401 Enlarged prostate with lower urinary tract symptoms: Secondary | ICD-10-CM | POA: Diagnosis not present

## 2015-11-03 DIAGNOSIS — R3915 Urgency of urination: Secondary | ICD-10-CM | POA: Diagnosis not present

## 2015-11-03 DIAGNOSIS — N3941 Urge incontinence: Secondary | ICD-10-CM | POA: Diagnosis not present

## 2016-03-02 DIAGNOSIS — L929 Granulomatous disorder of the skin and subcutaneous tissue, unspecified: Secondary | ICD-10-CM | POA: Diagnosis not present

## 2016-06-24 DIAGNOSIS — J301 Allergic rhinitis due to pollen: Secondary | ICD-10-CM | POA: Diagnosis not present

## 2016-06-24 DIAGNOSIS — Z131 Encounter for screening for diabetes mellitus: Secondary | ICD-10-CM | POA: Diagnosis not present

## 2016-06-24 DIAGNOSIS — Z0000 Encounter for general adult medical examination without abnormal findings: Secondary | ICD-10-CM | POA: Diagnosis not present

## 2016-06-24 DIAGNOSIS — E7800 Pure hypercholesterolemia, unspecified: Secondary | ICD-10-CM | POA: Diagnosis not present

## 2016-06-24 DIAGNOSIS — N400 Enlarged prostate without lower urinary tract symptoms: Secondary | ICD-10-CM | POA: Diagnosis not present

## 2016-07-21 DIAGNOSIS — R3915 Urgency of urination: Secondary | ICD-10-CM | POA: Diagnosis not present

## 2016-07-21 DIAGNOSIS — R351 Nocturia: Secondary | ICD-10-CM | POA: Diagnosis not present

## 2016-08-02 DIAGNOSIS — L929 Granulomatous disorder of the skin and subcutaneous tissue, unspecified: Secondary | ICD-10-CM | POA: Diagnosis not present

## 2016-12-16 DIAGNOSIS — M25552 Pain in left hip: Secondary | ICD-10-CM | POA: Diagnosis not present

## 2017-04-14 DIAGNOSIS — R21 Rash and other nonspecific skin eruption: Secondary | ICD-10-CM | POA: Diagnosis not present

## 2017-07-04 DIAGNOSIS — Z0000 Encounter for general adult medical examination without abnormal findings: Secondary | ICD-10-CM | POA: Diagnosis not present

## 2017-07-04 DIAGNOSIS — L409 Psoriasis, unspecified: Secondary | ICD-10-CM | POA: Diagnosis not present

## 2017-07-04 DIAGNOSIS — N400 Enlarged prostate without lower urinary tract symptoms: Secondary | ICD-10-CM | POA: Diagnosis not present

## 2017-07-04 DIAGNOSIS — E7800 Pure hypercholesterolemia, unspecified: Secondary | ICD-10-CM | POA: Diagnosis not present

## 2017-07-05 ENCOUNTER — Telehealth: Payer: Self-pay | Admitting: *Deleted

## 2017-07-05 NOTE — Telephone Encounter (Signed)
NOTES SENT TO SCHEDULING.  °

## 2017-11-09 DIAGNOSIS — D485 Neoplasm of uncertain behavior of skin: Secondary | ICD-10-CM | POA: Diagnosis not present

## 2017-11-09 DIAGNOSIS — L219 Seborrheic dermatitis, unspecified: Secondary | ICD-10-CM | POA: Diagnosis not present

## 2017-11-24 DIAGNOSIS — N400 Enlarged prostate without lower urinary tract symptoms: Secondary | ICD-10-CM | POA: Diagnosis not present

## 2017-11-24 DIAGNOSIS — L089 Local infection of the skin and subcutaneous tissue, unspecified: Secondary | ICD-10-CM | POA: Diagnosis not present

## 2018-03-09 ENCOUNTER — Ambulatory Visit
Admission: RE | Admit: 2018-03-09 | Discharge: 2018-03-09 | Disposition: A | Discharge status: Home / Self Care | Payer: Medicare Other | Source: Ambulatory Visit | Attending: Family Medicine | Admitting: Family Medicine

## 2018-03-09 ENCOUNTER — Other Ambulatory Visit: Payer: Self-pay | Admitting: Family Medicine

## 2018-03-09 DIAGNOSIS — R600 Localized edema: Secondary | ICD-10-CM | POA: Diagnosis not present

## 2018-03-09 DIAGNOSIS — M79604 Pain in right leg: Secondary | ICD-10-CM

## 2018-03-09 DIAGNOSIS — R030 Elevated blood-pressure reading, without diagnosis of hypertension: Secondary | ICD-10-CM | POA: Diagnosis not present

## 2018-05-25 DIAGNOSIS — Z23 Encounter for immunization: Secondary | ICD-10-CM | POA: Diagnosis not present

## 2018-05-29 ENCOUNTER — Emergency Department (HOSPITAL_COMMUNITY)
Admission: EM | Admit: 2018-05-29 | Discharge: 2018-05-29 | Disposition: A | Discharge status: Home / Self Care | Payer: Medicare Other | Attending: Emergency Medicine | Admitting: Emergency Medicine

## 2018-05-29 ENCOUNTER — Other Ambulatory Visit: Payer: Self-pay

## 2018-05-29 ENCOUNTER — Encounter (HOSPITAL_COMMUNITY): Payer: Self-pay | Admitting: Emergency Medicine

## 2018-05-29 DIAGNOSIS — Z85828 Personal history of other malignant neoplasm of skin: Secondary | ICD-10-CM | POA: Diagnosis not present

## 2018-05-29 DIAGNOSIS — Z87891 Personal history of nicotine dependence: Secondary | ICD-10-CM | POA: Diagnosis not present

## 2018-05-29 DIAGNOSIS — Z96642 Presence of left artificial hip joint: Secondary | ICD-10-CM | POA: Diagnosis not present

## 2018-05-29 DIAGNOSIS — J309 Allergic rhinitis, unspecified: Secondary | ICD-10-CM

## 2018-05-29 DIAGNOSIS — R0981 Nasal congestion: Secondary | ICD-10-CM | POA: Diagnosis present

## 2018-05-29 MED ORDER — OXYMETAZOLINE HCL 0.05 % NA SOLN
1.0000 | Freq: Once | NASAL | Status: AC
  Administered 2018-05-29: 1 via NASAL
  Filled 2018-05-29: qty 15

## 2018-05-29 MED ORDER — DIPHENHYDRAMINE HCL 25 MG PO CAPS
25.0000 mg | ORAL_CAPSULE | Freq: Once | ORAL | Status: AC
  Administered 2018-05-29: 25 mg via ORAL
  Filled 2018-05-29: qty 1

## 2018-05-29 NOTE — ED Triage Notes (Addendum)
Pt c/o nasal congestion and not being able to breath through his nose.  Pt also st's he is sneezing. Pt denies any pain.  Pt st's the nasal congestion gets worse at night

## 2018-05-29 NOTE — ED Provider Notes (Signed)
[facility] EMERGENCY DEPARTMENT Provider Note   CSN: [id number] Arrival date & time: 05/29/18  1825     History   Chief Complaint Chief Complaint  Patient presents with  . Nasal Congestion    HPI Nilesh Stegall Gotham is a 83 y.o. male.  83 year old male presents with complaint of watery nasal drainage onset 2 days ago, constant, no relief with OTC medications (unable to say what he has tried). Denies fevers, chills, coughing, sneezing.      Past Medical History:  Diagnosis Date  . Arthritis    L hip & R knee- OA  . Cancer ([place])    basal cell CA- face  . Shortness of breath     There are no active problems to display for this patient.   Past Surgical History:  Procedure Laterality Date  . APPENDECTOMY     [facility]- 2011  . EYE SURGERY     cataracts removed, laser (bilateral)  . NASAL SINUS SURGERY     [facility]- 1980's   . TOTAL HIP ARTHROPLASTY  11/24/2011   Procedure: TOTAL HIP ARTHROPLASTY;  Surgeon: Ninetta Lights, MD;  Location: [facility];  Service: Orthopedics;  Laterality: Left;  . variose     varicose vein surgery        Home Medications    Prior to Admission medications   Medication Sig Start Date End Date Taking? Authorizing Provider  Tamsulosin HCl (FLOMAX) 0.4 MG CAPS Take 0.4 mg by mouth daily.    [provider]    Family History Family History  Problem Relation Age of Onset  . Anesthesia problems Neg Hx     Social History Social History   Tobacco Use  . Smoking status: Former Smoker    Last attempt to quit: 11/18/2008    Years since quitting: 9.5  . Smokeless tobacco: Never Used  Substance Use Topics  . Alcohol use: No  . Drug use: No     Allergies   Patient has no known allergies.   Review of Systems Review of Systems  Constitutional: Negative for fever.  HENT: Positive for rhinorrhea. Negative for congestion, ear discharge, ear pain, facial swelling, sinus pressure, sinus pain, sneezing and sore throat.     Eyes: Negative for pain and visual disturbance.  Respiratory: Negative for cough.   Neurological: Negative for headaches.  Hematological: Negative for adenopathy.  All other systems reviewed and are negative.    Physical Exam Updated Vital Signs BP (!) 167/78 (BP Location: Right Arm)   Pulse (!) 54   Temp (!) 97.5 F (36.4 C) (Oral)   Resp 20   Ht 5\' 9"  (1.753 m)   Wt 90.7 kg   SpO2 96%   BMI 29.53 kg/m   Physical Exam  Constitutional: He is oriented to person, place, and time. He appears well-developed and well-nourished. No distress.  HENT:  Head: Normocephalic and atraumatic.  Right Ear: External ear normal.  Left Ear: External ear normal.  Nose: Rhinorrhea present. No mucosal edema. Right sinus exhibits no maxillary sinus tenderness and no frontal sinus tenderness. Left sinus exhibits no maxillary sinus tenderness and no frontal sinus tenderness.  Mouth/Throat: Oropharynx is clear and moist. No oropharyngeal exudate.  Eyes: Conjunctivae are normal.  Neck: Neck supple.  Cardiovascular: Normal rate, regular rhythm, normal heart sounds and intact distal pulses.  No murmur heard. Pulmonary/Chest: Effort normal and breath sounds normal. No respiratory distress.  Lymphadenopathy:    He has no cervical adenopathy.  Neurological: He  is alert and oriented to person, place, and time.  Skin: Skin is warm and dry. No rash noted. He is not diaphoretic.  Psychiatric: He has a normal mood and affect. His behavior is normal.  Nursing note and vitals reviewed.    ED Treatments / Results  Labs (all labs ordered are listed, but only abnormal results are displayed) Labs Reviewed - No data to display  EKG None  Radiology No results found.  Procedures Procedures (including critical care time)  Medications Ordered in ED Medications  diphenhydrAMINE (BENADRYL) capsule 25 mg (25 mg Oral Given 05/29/18 2110)  oxymetazoline (AFRIN) 0.05 % nasal spray 1 spray (1 spray Each Nare  Given 05/29/18 2110)     Initial Impression / Assessment and Plan / ED Course  I have reviewed the triage vital signs and the nursing notes.  Pertinent labs & imaging results that were available during my care of the patient were reviewed by me and considered in my medical decision making (see chart for details).  Clinical Course as of May 29 2109  Mon May 29, 2018  2110 83yo male with clear nasal drainage, no other complaints. No relief with OTC meds, not sure what he has taken. Patient was given Benadryl and Afrin in the [facility], advised limited use of Afrin for the next 3 days, Zyrtec daily, follow up with PCP.   [LM]    Clinical Course User Index [LM] Tacy Learn, PA-C    Final Clinical Impressions(s) / ED Diagnoses   Final diagnoses:  Allergic rhinitis, unspecified seasonality, unspecified trigger    ED Discharge Orders    None       Roque Lias 05/29/18 2110    Pattricia Boss, MD 06/01/18 1054

## 2018-05-29 NOTE — Discharge Instructions (Signed)
Use Afrin very sparingly for symptom relief for the next 3 days. Take Zyrtec daily. Follow up with your doctor.

## 2018-06-05 DIAGNOSIS — R42 Dizziness and giddiness: Secondary | ICD-10-CM | POA: Diagnosis not present

## 2018-06-05 DIAGNOSIS — J3489 Other specified disorders of nose and nasal sinuses: Secondary | ICD-10-CM | POA: Diagnosis not present

## 2018-06-09 DIAGNOSIS — J0141 Acute recurrent pansinusitis: Secondary | ICD-10-CM | POA: Diagnosis not present

## 2018-06-09 DIAGNOSIS — J339 Nasal polyp, unspecified: Secondary | ICD-10-CM | POA: Diagnosis not present

## 2018-06-09 DIAGNOSIS — J310 Chronic rhinitis: Secondary | ICD-10-CM | POA: Diagnosis not present

## 2018-07-10 DIAGNOSIS — I10 Essential (primary) hypertension: Secondary | ICD-10-CM | POA: Diagnosis not present

## 2018-07-10 DIAGNOSIS — Z0000 Encounter for general adult medical examination without abnormal findings: Secondary | ICD-10-CM | POA: Diagnosis not present

## 2018-07-10 DIAGNOSIS — N400 Enlarged prostate without lower urinary tract symptoms: Secondary | ICD-10-CM | POA: Diagnosis not present

## 2018-07-10 DIAGNOSIS — E7800 Pure hypercholesterolemia, unspecified: Secondary | ICD-10-CM | POA: Diagnosis not present

## 2018-07-20 ENCOUNTER — Other Ambulatory Visit: Payer: Self-pay | Admitting: Otolaryngology

## 2018-07-20 ENCOUNTER — Ambulatory Visit
Admission: RE | Admit: 2018-07-20 | Discharge: 2018-07-20 | Disposition: A | Discharge status: Home / Self Care | Payer: Medicare Other | Source: Ambulatory Visit | Attending: Otolaryngology | Admitting: Otolaryngology

## 2018-07-20 DIAGNOSIS — J3089 Other allergic rhinitis: Secondary | ICD-10-CM | POA: Diagnosis not present

## 2018-07-20 DIAGNOSIS — J320 Chronic maxillary sinusitis: Secondary | ICD-10-CM

## 2018-07-20 DIAGNOSIS — J019 Acute sinusitis, unspecified: Secondary | ICD-10-CM | POA: Diagnosis not present

## 2018-07-20 DIAGNOSIS — J301 Allergic rhinitis due to pollen: Secondary | ICD-10-CM | POA: Diagnosis not present

## 2018-07-20 DIAGNOSIS — J330 Polyp of nasal cavity: Secondary | ICD-10-CM | POA: Diagnosis not present

## 2018-07-20 DIAGNOSIS — J342 Deviated nasal septum: Secondary | ICD-10-CM | POA: Diagnosis not present

## 2018-07-27 DIAGNOSIS — J320 Chronic maxillary sinusitis: Secondary | ICD-10-CM | POA: Diagnosis not present

## 2018-07-27 DIAGNOSIS — J322 Chronic ethmoidal sinusitis: Secondary | ICD-10-CM | POA: Diagnosis not present

## 2018-07-27 DIAGNOSIS — J301 Allergic rhinitis due to pollen: Secondary | ICD-10-CM | POA: Diagnosis not present

## 2018-07-27 DIAGNOSIS — J3089 Other allergic rhinitis: Secondary | ICD-10-CM | POA: Diagnosis not present

## 2018-08-07 DIAGNOSIS — M25572 Pain in left ankle and joints of left foot: Secondary | ICD-10-CM | POA: Diagnosis not present

## 2018-08-07 DIAGNOSIS — I10 Essential (primary) hypertension: Secondary | ICD-10-CM | POA: Diagnosis not present

## 2018-08-07 DIAGNOSIS — J301 Allergic rhinitis due to pollen: Secondary | ICD-10-CM | POA: Diagnosis not present

## 2018-08-24 DIAGNOSIS — J320 Chronic maxillary sinusitis: Secondary | ICD-10-CM | POA: Diagnosis not present

## 2018-08-24 DIAGNOSIS — J301 Allergic rhinitis due to pollen: Secondary | ICD-10-CM | POA: Diagnosis not present

## 2018-08-24 DIAGNOSIS — J342 Deviated nasal septum: Secondary | ICD-10-CM | POA: Diagnosis not present

## 2018-08-24 DIAGNOSIS — J3081 Allergic rhinitis due to animal (cat) (dog) hair and dander: Secondary | ICD-10-CM | POA: Diagnosis not present

## 2018-09-11 ENCOUNTER — Ambulatory Visit
Admission: RE | Admit: 2018-09-11 | Discharge: 2018-09-11 | Disposition: A | Discharge status: Home / Self Care | Payer: Medicare Other | Source: Ambulatory Visit | Attending: Otolaryngology | Admitting: Otolaryngology

## 2018-09-11 ENCOUNTER — Other Ambulatory Visit: Payer: Self-pay | Admitting: Otolaryngology

## 2018-09-11 DIAGNOSIS — J301 Allergic rhinitis due to pollen: Secondary | ICD-10-CM | POA: Diagnosis not present

## 2018-09-11 DIAGNOSIS — R0981 Nasal congestion: Secondary | ICD-10-CM | POA: Diagnosis not present

## 2018-09-11 DIAGNOSIS — J329 Chronic sinusitis, unspecified: Secondary | ICD-10-CM

## 2018-09-11 DIAGNOSIS — J3081 Allergic rhinitis due to animal (cat) (dog) hair and dander: Secondary | ICD-10-CM | POA: Diagnosis not present

## 2018-09-11 DIAGNOSIS — J320 Chronic maxillary sinusitis: Secondary | ICD-10-CM | POA: Diagnosis not present

## 2018-09-11 DIAGNOSIS — J322 Chronic ethmoidal sinusitis: Secondary | ICD-10-CM | POA: Diagnosis not present

## 2018-10-17 DIAGNOSIS — L989 Disorder of the skin and subcutaneous tissue, unspecified: Secondary | ICD-10-CM | POA: Diagnosis not present

## 2018-10-17 DIAGNOSIS — I10 Essential (primary) hypertension: Secondary | ICD-10-CM | POA: Diagnosis not present

## 2018-10-17 DIAGNOSIS — J301 Allergic rhinitis due to pollen: Secondary | ICD-10-CM | POA: Diagnosis not present

## 2018-11-24 DIAGNOSIS — M25552 Pain in left hip: Secondary | ICD-10-CM | POA: Diagnosis not present

## 2019-04-02 DIAGNOSIS — L570 Actinic keratosis: Secondary | ICD-10-CM | POA: Diagnosis not present

## 2019-04-02 DIAGNOSIS — D1801 Hemangioma of skin and subcutaneous tissue: Secondary | ICD-10-CM | POA: Diagnosis not present

## 2019-04-02 DIAGNOSIS — C44319 Basal cell carcinoma of skin of other parts of face: Secondary | ICD-10-CM | POA: Diagnosis not present

## 2019-04-02 DIAGNOSIS — L987 Excessive and redundant skin and subcutaneous tissue: Secondary | ICD-10-CM | POA: Diagnosis not present

## 2019-04-18 DIAGNOSIS — N400 Enlarged prostate without lower urinary tract symptoms: Secondary | ICD-10-CM | POA: Diagnosis not present

## 2019-04-18 DIAGNOSIS — R300 Dysuria: Secondary | ICD-10-CM | POA: Diagnosis not present

## 2019-04-18 DIAGNOSIS — R350 Frequency of micturition: Secondary | ICD-10-CM | POA: Diagnosis not present

## 2019-04-18 DIAGNOSIS — I10 Essential (primary) hypertension: Secondary | ICD-10-CM | POA: Diagnosis not present

## 2019-05-28 DIAGNOSIS — R319 Hematuria, unspecified: Secondary | ICD-10-CM | POA: Diagnosis not present

## 2019-08-14 DIAGNOSIS — H02105 Unspecified ectropion of left lower eyelid: Secondary | ICD-10-CM | POA: Diagnosis not present

## 2019-08-14 DIAGNOSIS — H353131 Nonexudative age-related macular degeneration, bilateral, early dry stage: Secondary | ICD-10-CM | POA: Diagnosis not present

## 2019-08-14 DIAGNOSIS — H02102 Unspecified ectropion of right lower eyelid: Secondary | ICD-10-CM | POA: Diagnosis not present

## 2019-08-14 DIAGNOSIS — Z961 Presence of intraocular lens: Secondary | ICD-10-CM | POA: Diagnosis not present

## 2019-09-17 DIAGNOSIS — L821 Other seborrheic keratosis: Secondary | ICD-10-CM | POA: Diagnosis not present

## 2019-09-17 DIAGNOSIS — L814 Other melanin hyperpigmentation: Secondary | ICD-10-CM | POA: Diagnosis not present

## 2019-09-17 DIAGNOSIS — Z85828 Personal history of other malignant neoplasm of skin: Secondary | ICD-10-CM | POA: Diagnosis not present

## 2019-09-17 DIAGNOSIS — D225 Melanocytic nevi of trunk: Secondary | ICD-10-CM | POA: Diagnosis not present

## 2019-11-12 DIAGNOSIS — I10 Essential (primary) hypertension: Secondary | ICD-10-CM | POA: Diagnosis not present

## 2019-11-12 DIAGNOSIS — N400 Enlarged prostate without lower urinary tract symptoms: Secondary | ICD-10-CM | POA: Diagnosis not present

## 2020-01-07 DIAGNOSIS — L821 Other seborrheic keratosis: Secondary | ICD-10-CM | POA: Diagnosis not present

## 2020-01-07 DIAGNOSIS — Z85828 Personal history of other malignant neoplasm of skin: Secondary | ICD-10-CM | POA: Diagnosis not present

## 2020-03-26 DIAGNOSIS — M533 Sacrococcygeal disorders, not elsewhere classified: Secondary | ICD-10-CM | POA: Diagnosis not present

## 2020-04-04 DIAGNOSIS — M4856XD Collapsed vertebra, not elsewhere classified, lumbar region, subsequent encounter for fracture with routine healing: Secondary | ICD-10-CM | POA: Diagnosis not present

## 2020-04-04 DIAGNOSIS — S71001A Unspecified open wound, right hip, initial encounter: Secondary | ICD-10-CM | POA: Diagnosis not present

## 2020-04-04 DIAGNOSIS — M533 Sacrococcygeal disorders, not elsewhere classified: Secondary | ICD-10-CM | POA: Diagnosis not present

## 2020-05-06 ENCOUNTER — Other Ambulatory Visit: Payer: Self-pay

## 2020-05-06 ENCOUNTER — Encounter (HOSPITAL_BASED_OUTPATIENT_CLINIC_OR_DEPARTMENT_OTHER): Payer: Medicare Other | Attending: Internal Medicine | Admitting: Internal Medicine

## 2020-05-06 DIAGNOSIS — T8131XD Disruption of external operation (surgical) wound, not elsewhere classified, subsequent encounter: Secondary | ICD-10-CM | POA: Insufficient documentation

## 2020-05-06 DIAGNOSIS — L89159 Pressure ulcer of sacral region, unspecified stage: Secondary | ICD-10-CM | POA: Diagnosis not present

## 2020-05-06 DIAGNOSIS — Z96642 Presence of left artificial hip joint: Secondary | ICD-10-CM | POA: Diagnosis not present

## 2020-05-06 DIAGNOSIS — M1990 Unspecified osteoarthritis, unspecified site: Secondary | ICD-10-CM | POA: Diagnosis not present

## 2020-05-06 DIAGNOSIS — X58XXXA Exposure to other specified factors, initial encounter: Secondary | ICD-10-CM | POA: Diagnosis not present

## 2020-05-07 NOTE — Progress Notes (Signed)
Mark Howe, Mark Howe ([id number]) Visit Report for 05/06/2020 Abuse/Suicide Risk Screen Details Patient Name: Date of Service: Mark Howe, [place] RO LD D. 05/06/2020 1:15 PM Medical Record Number: [id number] Patient Account Number: [id number] Date of Birth/Sex: Treating RN: 1935-01-04 (85 y.o. Mark Howe Primary Care Mark Howe: Mark Howe Other Clinician: Referring Mark Howe: Treating Mark Howe/Extender: Mark Howe in Treatment: 0 Abuse/Suicide Risk Screen Items Answer ABUSE RISK SCREEN: Has anyone close to you tried to hurt or harm you recentlyo No Do you feel uncomfortable with anyone in your familyo No Has anyone forced you do things that you didnt want to doo No Electronic Signature(s) Signed: 05/06/2020 5:09:36 PM By: Mark Gouty RN, BSN Entered By: Mark Howe on 05/06/2020 13:57:45 -------------------------------------------------------------------------------- Activities of Daily Living Details Patient Name: Date of Service: [place] Howe, [name] 05/06/2020 1:15 PM Medical Record Number: [id number] Patient Account Number: [id number] Date of Birth/Sex: Treating RN: [birth year] (85 y.o. Mark Howe Primary Care Mark Howe: Mark Howe Other Clinician: Referring Mark Howe: Treating Mark Howe/Extender: Mark Howe in Treatment: 0 Activities of Daily Living Items Answer Activities of Daily Living (Please select one for each item) Drive Automobile Completely Able T Medications ake Completely Able Use T elephone Completely Able Care for Appearance Completely Able Use T oilet Completely Able Bath / Shower Completely Able Dress Self Completely Able Feed Self Completely Able Walk Completely Able Get In / Out Bed Completely Able Housework Completely Able Prepare Meals Completely Able Handle Money Completely Able Shop for Self Completely Able Electronic Signature(s) Signed: 05/06/2020 5:09:36 PM By: Mark Gouty RN, BSN Entered By: Mark Howe on  05/06/2020 13:58:06 -------------------------------------------------------------------------------- Education Screening Details Patient Name: Date of Service: [name], [name] 05/06/2020 1:15 PM Medical Record Number: [id number] Patient Account Number: [id number] Date of Birth/Sex: Treating RN: [birth year] (85 y.o. Mark Howe Primary Care Idonia Zollinger: Mark Howe Other Clinician: Referring Mark Howe: Treating Mark Howe/Extender: Mark Howe in Treatment: 0 Primary Learner Assessed: Patient Learning Preferences/Education Level/Primary Language Learning Preference: Explanation, Demonstration, Printed Material Highest Education Level: College or Above Preferred Language: English Cognitive Barrier Language Barrier: No Translator Needed: No Memory Deficit: No Emotional Barrier: No Cultural/Religious Beliefs Affecting Medical Care: No Physical Barrier Impaired Vision: No Impaired Hearing: No Decreased Hand dexterity: No Knowledge/Comprehension Knowledge Level: High Comprehension Level: High Ability to understand written instructions: High Ability to understand verbal instructions: High Motivation Anxiety Level: Calm Cooperation: Cooperative Education Importance: Acknowledges Need Interest in Health Problems: Asks Questions Perception: Coherent Willingness to Engage in Self-Management High Activities: Readiness to Engage in Self-Management High Activities: Electronic Signature(s) Signed: 05/06/2020 5:09:36 PM By: Mark Gouty RN, BSN Entered By: Mark Howe on 05/06/2020 13:58:40 -------------------------------------------------------------------------------- Fall Risk Assessment Details Patient Name: Date of Service: [name], [name] 05/06/2020 1:15 PM Medical Record Number: [id number] Patient Account Number: [id number] Date of Birth/Sex: Treating RN: June 05, 1935 (85 y.o. Mark Howe Primary Care Mark Howe: Mark Howe Other  Clinician: Referring Mark Howe: Treating Mark Howe/Extender: Mark Howe in Treatment: 0 Fall Risk Assessment Items Have you had 2 or more falls in the last 12 monthso 0 No Have you had any fall that resulted in injury in the last 12 monthso 0 No FALLS RISK SCREEN History of falling - immediate or within 3 months 0 No Secondary diagnosis (Do you have 2 or more medical diagnoseso) 0 No Ambulatory aid None/bed rest/wheelchair/nurse 0 Yes Crutches/cane/walker 0 No Furniture 0 No Intravenous therapy Access/Saline/Heparin Lock 0 No Gait/Transferring Normal/ bed rest/ wheelchair 0  Yes Weak (short steps with or without shuffle, stooped but able to lift head while walking, may seek 0 No support from furniture) Impaired (short steps with shuffle, may have difficulty arising from chair, head down, impaired 0 No balance) Mental Status Oriented to own ability 0 Yes Electronic Signature(s) Signed: 05/06/2020 5:09:36 PM By: Mark Gouty RN, BSN Entered By: Mark Howe on 05/06/2020 13:58:58 -------------------------------------------------------------------------------- Foot Assessment Details Patient Name: Date of Service: [name], [name] 05/06/2020 1:15 PM Medical Record Number: [id number] Patient Account Number: [id number] Date of Birth/Sex: Treating RN: [birth year] (85 y.o. Mark Howe Primary Care Mark Howe: Mark Howe Other Clinician: Referring Mark Howe: Treating Mark Howe/Extender: Mark Howe in Treatment: 0 Foot Assessment Items Site Locations + = Sensation present, - = Sensation absent, C = Callus, U = Ulcer R = Redness, W = Warmth, M = Maceration, PU = Pre-ulcerative lesion F = Fissure, S = Swelling, D = Dryness Assessment Right: Left: Other Deformity: No No Prior Foot Ulcer: No No Prior Amputation: No No Charcot Joint: No No Ambulatory Status: Ambulatory Without Help Gait: Steady Electronic Signature(s) Signed: 05/06/2020 5:09:36 PM  By: Mark Gouty RN, BSN Entered By: Mark Howe on 05/06/2020 13:59:50 -------------------------------------------------------------------------------- Nutrition Risk Screening Details Patient Name: Date of Service: [name], [name] 05/06/2020 1:15 PM Medical Record Number: [id number] Patient Account Number: [id number] Date of Birth/Sex: Treating RN: [birth year] (85 y.o. Mark Howe Primary Care Ameirah Khatoon: Mark Howe Other Clinician: Referring Larron Armor: Treating Ramonda Galyon/Extender: Mark Howe in Treatment: 0 Height (in): 69 Weight (lbs): 190 Body Mass Index (BMI): 28.1 Nutrition Risk Screening Items Score Screening NUTRITION RISK SCREEN: I have an illness or condition that made me change the kind and/or amount of food I eat 0 No I eat fewer than two meals per day 0 No I eat few fruits and vegetables, or milk products 0 No I have three or more drinks of beer, liquor or wine almost every day 0 No I have tooth or mouth problems that make it hard for me to eat 0 No I don't always have enough money to buy the food I need 0 No I eat alone most of the time 1 Yes I take three or more different prescribed or over-the-counter drugs a day 0 No Without wanting to, I have lost or gained 10 pounds in the last six months 0 No I am not always physically able to shop, cook and/or feed myself 0 No Nutrition Protocols Good Risk Protocol 0 No interventions needed Moderate Risk Protocol High Risk Proctocol Risk Level: Good Risk Score: 1 Electronic Signature(s) Signed: 05/06/2020 5:09:36 PM By: Mark Gouty RN, BSN Entered By: Mark Howe on 05/06/2020 13:59:42

## 2020-05-10 NOTE — Progress Notes (Signed)
Mark Howe Howe, Mark Howe Howe ([id number]) Visit Report for 05/06/2020 Allergy List Details Patient Name: Date of Service: Mark Howe Howe, Mark Howe RO LD D. 05/06/2020 1:15 PM Medical Record Number: [id number] Patient Account Number: [id number] Date of Birth/Sex: Treating RN: [birth year] (85 y.o. Male) Baruch Gouty Primary Care Kaiyan Luczak: Frann Rider Other Clinician: Referring Chemika Nightengale: Treating Aarin Sparkman/Extender: Cheree Ditto in Treatment: 0 Allergies Active Allergies No Known Allergies Allergy Notes Electronic Signature(s) Signed: 05/06/2020 5:09:36 PM By: Baruch Gouty RN, BSN Entered By: Baruch Gouty on 05/06/2020 13:40:21 -------------------------------------------------------------------------------- Arrival Information Details Patient Name: Date of Service: [name], [name] 05/06/2020 1:15 PM Medical Record Number: [id number] Patient Account Number: [id number] Date of Birth/Sex: Treating RN: 1935-04-13 (85 y.o. Male) Carlene Coria Primary Care Dahiana Kulak: Frann Rider Other Clinician: Referring Autumne Kallio: Treating Krysteena Stalker/Extender: Cheree Ditto in Treatment: 0 Visit Information Patient Arrived: Ambulatory Arrival Time: 13:31 Accompanied By: self Transfer Assistance: None Patient Identification Verified: Yes Secondary Verification Process Completed: Yes Patient Requires Transmission-Based Precautions: No Patient Has Alerts: No Electronic Signature(s) Signed: 05/06/2020 5:09:36 PM By: Baruch Gouty RN, BSN Entered By: Baruch Gouty on 05/06/2020 13:39:26 -------------------------------------------------------------------------------- Clinic Level of Care Assessment Details Patient Name: Date of Service: [name], [name] 05/06/2020 1:15 PM Medical Record Number: [id number] Patient Account Number: [id number] Date of Birth/Sex: Treating RN: [birth year] (85 y.o. Male) Carlene Coria Primary Care Perle Gibbon: Frann Rider Other Clinician: Referring Merilyn Pagan: Treating  Joya Willmott/Extender: Cheree Ditto in Treatment: 0 Clinic Level of Care Assessment Items TOOL 2 Quantity Score X- 1 0 Use when only an EandM is performed on the INITIAL visit ASSESSMENTS - Nursing Assessment / Reassessment X- 1 20 General Physical Exam (combine w/ comprehensive assessment (listed just below) when performed on new pt. evals) X- 1 25 Comprehensive Assessment (HX, ROS, Risk Assessments, Wounds Hx, etc.) ASSESSMENTS - Wound and Skin A ssessment / Reassessment X - Simple Wound Assessment / Reassessment - one wound 1 5 []  - 0 Complex Wound Assessment / Reassessment - multiple wounds []  - 0 Dermatologic / Skin Assessment (not related to wound area) ASSESSMENTS - Ostomy and/or Continence Assessment and Care []  - 0 Incontinence Assessment and Management []  - 0 Ostomy Care Assessment and Management (repouching, etc.) PROCESS - Coordination of Care X - Simple Patient / Family Education for ongoing care 1 15 []  - 0 Complex (extensive) Patient / Family Education for ongoing care X- 1 10 Staff obtains Programmer, systems, Records, T Results / Process Orders est []  - 0 Staff telephones HHA, Nursing Homes / Clarify orders / etc []  - 0 Routine Transfer to another Facility (non-emergent condition) []  - 0 Routine Hospital Admission (non-emergent condition) X- 1 15 New Admissions / Biomedical engineer / Ordering NPWT Apligraf, etc. , []  - 0 Emergency Hospital Admission (emergent condition) X- 1 10 Simple Discharge Coordination []  - 0 Complex (extensive) Discharge Coordination PROCESS - Special Needs []  - 0 Pediatric / Minor Patient Management []  - 0 Isolation Patient Management []  - 0 Hearing / Language / Visual special needs []  - 0 Assessment of Community assistance (transportation, D/C planning, etc.) []  - 0 Additional assistance / Altered mentation []  - 0 Support Surface(s) Assessment (bed, cushion, seat, etc.) INTERVENTIONS - Wound Cleansing / Measurement []   - 0 Wound Imaging (photographs - any number of wounds) []  - 0 Wound Tracing (instead of photographs) []  - 0 Simple Wound Measurement - one wound []  - 0 Complex Wound Measurement - multiple wounds []  - 0 Simple Wound Cleansing - one wound []  -  0 Complex Wound Cleansing - multiple wounds INTERVENTIONS - Wound Dressings []  - 0 Small Wound Dressing one or multiple wounds []  - 0 Medium Wound Dressing one or multiple wounds []  - 0 Large Wound Dressing one or multiple wounds []  - 0 Application of Medications - injection INTERVENTIONS - Miscellaneous []  - 0 External ear exam []  - 0 Specimen Collection (cultures, biopsies, blood, body fluids, etc.) []  - 0 Specimen(s) / Culture(s) sent or taken to Lab for analysis []  - 0 Patient Transfer (multiple staff / Harrel Lemon Lift / Similar devices) []  - 0 Simple Staple / Suture removal (25 or less) []  - 0 Complex Staple / Suture removal (26 or more) []  - 0 Hypo / Hyperglycemic Management (close monitor of Blood Glucose) []  - 0 Ankle / Brachial Index (ABI) - do not check if billed separately Has the patient been seen at the hospital within the last three years: Yes Total Score: 100 Level Of Care: New/Established - Level 3 Electronic Signature(s) Signed: 05/09/2020 5:49:17 PM By: Carlene Coria RN Entered By: Carlene Coria on 05/06/2020 14:28:25 -------------------------------------------------------------------------------- Lower Extremity Assessment Details Patient Name: Date of Service: [name], [name] 05/06/2020 1:15 PM Medical Record Number: [id number] Patient Account Number: [id number] Date of Birth/Sex: Treating RN: 1934-09-29 (85 y.o. Male) Baruch Gouty Primary Care Allure Greaser: Frann Rider Other Clinician: Referring Arasely Akkerman: Treating Julionna Marczak/Extender: Cheree Ditto in Treatment: 0 Electronic Signature(s) Signed: 05/06/2020 5:09:36 PM By: Baruch Gouty RN, BSN Entered By: Baruch Gouty on 05/06/2020  13:59:57 -------------------------------------------------------------------------------- [place] Details Patient Name: Date of Service: [name], [name] 05/06/2020 1:15 PM Medical Record Number: [id number] Patient Account Number: [id number] Date of Birth/Sex: Treating RN: 19-Feb-1935 (85 y.o. Male) Carlene Coria Primary Care Arie Powell: Frann Rider Other Clinician: Referring Noble Cicalese: Treating Taylyn Brame/Extender: Cheree Ditto in Treatment: 0 Active Inactive Electronic Signature(s) Signed: 05/09/2020 5:49:17 PM By: Carlene Coria RN Entered By: Carlene Coria on 05/06/2020 14:23:24 -------------------------------------------------------------------------------- Pain Assessment Details Patient Name: Date of Service: [name], [name] 05/06/2020 1:15 PM Medical Record Number: [id number] Patient Account Number: [id number] Date of Birth/Sex: Treating RN: Jun 01, 1935 (85 y.o. Male) Baruch Gouty Primary Care Todd Jelinski: Frann Rider Other Clinician: Referring Billye Nydam: Treating Dionicia Cerritos/Extender: Cheree Ditto in Treatment: 0 Active Problems Location of Pain Severity and Description of Pain Patient Has Paino Yes Site Locations Duration of the Pain. Constant / Intermittento Intermittent Rate the pain. Current Pain Level: 0 Worst Pain Level: 4 Least Pain Level: 0 Character of Pain Describe the Pain: Other: pulling, sore Pain Management and Medication Current Pain Management: Other: reposition Is the Current Pain Management Adequate: Adequate How does your wound impact your activities of daily livingo Sleep: No Bathing: No Appetite: No Relationship With Others: No Bladder Continence: No Emotions: No Bowel Continence: No Work: No Toileting: No Drive: No Dressing: No Hobbies: No Engineer, maintenance) Signed: 05/06/2020 5:09:36 PM By: Baruch Gouty RN, BSN Entered By: Baruch Gouty on 05/06/2020  14:03:10 -------------------------------------------------------------------------------- Patient/Caregiver Education Details Patient Name: Date of Service: [name], [name] 8/24/2021andnbsp1:15 PM Medical Record Number: [id number] Patient Account Number: [id number] Date of Birth/Gender: Treating RN: 14-Jun-1935 (85 y.o. Male) Carlene Coria Primary Care Physician: Frann Rider Other Clinician: Referring Physician: Treating Physician/Extender: Cheree Ditto in Treatment: 0 Education Assessment Education Provided To: Patient Education Topics Provided Wound/Skin Impairment: Methods: Explain/Verbal Responses: State content correctly Electronic Signature(s) Signed: 05/09/2020 5:49:17 PM By: Carlene Coria RN Entered By: Carlene Coria on 05/06/2020 14:23:35 --------------------------------------------------------------------------------  Vitals Details Patient Name: Date of Service: [place] Howe, Mark Howe RO LD D. 05/06/2020 1:15 PM Medical Record Number: [id number] Patient Account Number: [id number] Date of Birth/Sex: Treating RN: 10-15-1934 (85 y.o. Male) Carlene Coria Primary Care Casten Floren: Frann Rider Other Clinician: Referring Aubrei Bouchie: Treating Reginae Wolfrey/Extender: Cheree Ditto in Treatment: 0 Vital Signs Time Taken: 01:31 Temperature (F): 98.6 Height (in): 69 Pulse (bpm): 53 Source: Stated Respiratory Rate (breaths/min): 18 Weight (lbs): 190 Blood Pressure (mmHg): 131/65 Source: Stated Reference Range: 80 - 120 mg / dl Body Mass Index (BMI): 28.1 Electronic Signature(s) Signed: 05/08/2020 10:16:14 AM By: Sandre [name] Entered By: Sandre [name] on 05/06/2020 13:36:37

## 2020-05-10 NOTE — Progress Notes (Signed)
Mark Howe, Mark Howe ([id number]) Visit Report for 05/06/2020 Chief Complaint Document Details Patient Name: Date of Service: [place] RD, [place] RO LD D. 05/06/2020 1:15 PM Medical Record Number: [id number] Patient Account Number: [id number] Date of Birth/Sex: Treating RN: [birth year] (85 y.o. Male) Carlene Coria Primary Care Provider: Frann Rider Other Clinician: Referring Provider: Treating Provider/Extender: Cheree Ditto in Treatment: 0 Information Obtained from: Patient Chief Complaint 05/06/2020; patient is here for review of an area on his lower sacrum/coccyx as well as a draining area in the midsection of the left hip surgical scar Electronic Signature(s) Signed: 05/08/2020 6:08:30 PM By: Linton Ham MD Entered By: Linton Ham on 05/06/2020 14:34:58 -------------------------------------------------------------------------------- HPI Details Patient Name: Date of Service: [place] RD, [name] 05/06/2020 1:15 PM Medical Record Number: [id number] Patient Account Number: [id number] Date of Birth/Sex: Treating RN: Aug 06, 1935 (85 y.o. Male) Carlene Coria Primary Care Provider: Frann Rider Other Clinician: Referring Provider: Treating Provider/Extender: Cheree Ditto in Treatment: 0 History of Present Illness HPI Description: ADMISSION 05/06/2020 Patient comes here with very little information. I can see in care everywhere that he has been followed by [facility] for a compression fracture of L1/L2. During his last visit there on 04/04/2020 he was noted to have a pressure ulcer on the lower sacrum/coccyx. There is also noted he had a draining area in the mid part of the left hip incision from a total hip replacement. They referred the patient to Dr. Almyra Deforest for his review of this. The original hip surgery was done by Dr. Percell Miller in 2013. Because of the drainage in the left hip he was placed on Keflex for 14 days. He apparently saw Dr. Maureen Ralphs 2 weeks ago according to the patient and  he did not feel that anything could be done about this. The patient denies pain in the hip surgical site. No fever no chills no systemic symptoms Past medical history includes a total hip replacement on left in 2013, nasal polyps and sinusitis, recent compression fracture at L1-L2 Electronic Signature(s) Signed: 05/08/2020 6:08:30 PM By: Linton Ham MD Entered By: Linton Ham on 05/06/2020 14:37:45 -------------------------------------------------------------------------------- Physical Exam Details Patient Name: Date of Service: [place] RD, [name] 05/06/2020 1:15 PM Medical Record Number: [id number] Patient Account Number: [id number] Date of Birth/Sex: Treating RN: 1934/10/20 (85 y.o. Male) Carlene Coria Primary Care Provider: Frann Rider Other Clinician: Referring Provider: Treating Provider/Extender: Cheree Ditto in Treatment: 0 Constitutional Sitting or standing Blood Pressure is within target range for patient.. Pulse regular and within target range for patient.Marland Kitchen Respirations regular, non-labored and within target range.. Temperature is normal and within the target range for the patient.Marland Kitchen Appears in no distress. Respiratory work of breathing is normal. Notes Wound exam Left hip; the patient has a small probing area in the middle of the left hip incision. This has 5 or 6 cm of depth and some serosanguineous drainage. There is no obvious soft tissue infection around the wound no purulence nothing really look like it needed culturing On the coccyx area he looks like he has scar tissue here but no open wound again no evidence of infection. There is nothing really that needs a specific dressing in this area. Electronic Signature(s) Signed: 05/08/2020 6:08:30 PM By: Linton Ham MD Entered By: Linton Ham on 05/06/2020 14:42:12 -------------------------------------------------------------------------------- Physician Orders Details Patient Name: Date of Service: [place]  RD, [name] 05/06/2020 1:15 PM Medical Record Number: [id number] Patient Account Number: [id number] Date of Birth/Sex: Treating RN: 1935/03/31 (85  y.o. Male) Carlene Coria Primary Care Provider: Frann Rider Other Clinician: Referring Provider: Treating Provider/Extender: Cheree Ditto in Treatment: 0 Verbal / Phone Orders: No Diagnosis Coding Discharge From [facility] Services Discharge from [place] - keep area clean and dry Electronic Signature(s) Signed: 05/08/2020 6:08:30 PM By: Linton Ham MD Signed: 05/09/2020 5:49:17 PM By: Carlene Coria RN Entered By: Carlene Coria on 05/06/2020 14:22:56 -------------------------------------------------------------------------------- Problem List Details Patient Name: Date of Service: [place] RD, [name] 05/06/2020 1:15 PM Medical Record Number: [id number] Patient Account Number: [id number] Date of Birth/Sex: Treating RN: [birth year] (85 y.o. Male) Carlene Coria Primary Care Provider: Frann Rider Other Clinician: Referring Provider: Treating Provider/Extender: Cheree Ditto in Treatment: 0 Active Problems ICD-10 Encounter Encounter Code Description Active Date MDM Diagnosis L89.159 Pressure ulcer of sacral region, unspecified stage 05/06/2020 No Yes T81.31XD Disruption of external operation (surgical) wound, not elsewhere classified, 05/06/2020 No Yes subsequent encounter Z47.1 Aftercare following joint replacement surgery 05/06/2020 No Yes Inactive Problems Resolved Problems Electronic Signature(s) Signed: 05/08/2020 6:08:30 PM By: Linton Ham MD Entered By: Linton Ham on 05/06/2020 14:32:46 -------------------------------------------------------------------------------- Progress Note Details Patient Name: Date of Service: [place] RD, [name] 05/06/2020 1:15 PM Medical Record Number: [id number] Patient Account Number: [id number] Date of Birth/Sex: Treating RN: March 04, 1935 (85 y.o. Male) Carlene Coria Primary  Care Provider: Frann Rider Other Clinician: Referring Provider: Treating Provider/Extender: Cheree Ditto in Treatment: 0 Subjective Chief Complaint Information obtained from Patient 05/06/2020; patient is here for review of an area on his lower sacrum/coccyx as well as a draining area in the midsection of the left hip surgical scar History of Present Illness (HPI) ADMISSION 05/06/2020 Patient comes here with very little information. I can see in care everywhere that he has been followed by [facility] for a compression fracture of L1/L2. During his last visit there on 04/04/2020 he was noted to have a pressure ulcer on the lower sacrum/coccyx. There is also noted he had a draining area in the mid part of the left hip incision from a total hip replacement. They referred the patient to Dr. Almyra Deforest for his review of this. The original hip surgery was done by Dr. Percell Miller in 2013. Because of the drainage in the left hip he was placed on Keflex for 14 days. He apparently saw Dr. Maureen Ralphs 2 weeks ago according to the patient and he did not feel that anything could be done about this. The patient denies pain in the hip surgical site. No fever no chills no systemic symptoms Past medical history includes a total hip replacement on left in 2013, nasal polyps and sinusitis, recent compression fracture at L1-L2 Patient History Information obtained from Patient. Allergies No Known Allergies Family History Cancer - Siblings, Lung Disease - Siblings, Stroke - Siblings, No family history of Diabetes, Heart Disease, Hereditary Spherocytosis, Hypertension, Kidney Disease, Seizures, Thyroid Problems, Tuberculosis. Social History Former smoker - quit >15 yrs ago, Marital Status - Single, Alcohol Use - Never, Drug Use - No History, Caffeine Use - Moderate - tea. Medical History Eyes Patient has history of Cataracts - bil removed Denies history of Glaucoma, Optic Neuritis Endocrine Denies history of  Type I Diabetes, Type II Diabetes Genitourinary Denies history of End Stage Renal Disease Integumentary (Skin) Denies history of History of Burn Musculoskeletal Patient has history of Osteoarthritis Oncologic Denies history of Received Chemotherapy, Received Radiation Psychiatric Denies history of Anorexia/bulimia, Confinement Anxiety Hospitalization/Surgery History - left total hip replacement. - appendectomy. - bil cataract  removed. - nasal surgery. - varicose vein surgery bil. Medical A Surgical History Notes nd Ear/Nose/Mouth/Throat rhinitis, nasal polyps Oncologic skin ca removed Review of Systems (ROS) Constitutional Symptoms (General Health) Denies complaints or symptoms of Fatigue, Fever, Chills, Marked Weight Change. Eyes Complains or has symptoms of Glasses / Contacts - reading. Denies complaints or symptoms of Dry Eyes, Vision Changes. Ear/Nose/Mouth/Throat Complains or has symptoms of Chronic sinus problems or rhinitis. Respiratory Denies complaints or symptoms of Chronic or frequent coughs, Shortness of Breath. Cardiovascular Denies complaints or symptoms of Chest pain. Gastrointestinal Denies complaints or symptoms of Frequent diarrhea, Nausea, Vomiting. Endocrine Denies complaints or symptoms of Heat/cold intolerance. Genitourinary Complains or has symptoms of Frequent urination. Integumentary (Skin) Complains or has symptoms of Wounds - left hip. Musculoskeletal Denies complaints or symptoms of Muscle Pain, Muscle Weakness. Neurologic Denies complaints or symptoms of Numbness/parasthesias. Psychiatric Denies complaints or symptoms of Claustrophobia, Suicidal. Objective Constitutional Sitting or standing Blood Pressure is within target range for patient.. Pulse regular and within target range for patient.Marland Kitchen Respirations regular, non-labored and within target range.. Temperature is normal and within the target range for the patient.Marland Kitchen Appears in no  distress. Vitals Time Taken: 1:31 AM, Height: 69 in, Source: Stated, Weight: 190 lbs, Source: Stated, BMI: 28.1, Temperature: 98.6 F, Pulse: 53 bpm, Respiratory Rate: 18 breaths/min, Blood Pressure: 131/65 mmHg. Respiratory work of breathing is normal. General Notes: Wound exam ooLeft hip; the patient has a small probing area in the middle of the left hip incision. This has 5 or 6 cm of depth and some serosanguineous drainage. There is no obvious soft tissue infection around the wound no purulence nothing really look like it needed culturing ooOn the coccyx area he looks like he has scar tissue here but no open wound again no evidence of infection. There is nothing really that needs a specific dressing in this area. Assessment Active Problems ICD-10 Pressure ulcer of sacral region, unspecified stage Disruption of external operation (surgical) wound, not elsewhere classified, subsequent encounter Aftercare following joint replacement surgery Plan Discharge From [facility] Services: Discharge from [place] - keep area clean and dry #1 the patient has no open wound over the coccyx although it certainly looks like there was one at some point. He has some episodic discomfort here but no evidence of infection 2. The patient has a draining sinus over the mid part of the left hip wound. This probably represents a chronic deep infection here that exits through this area. Whether this connects with the hip joint itself I am uncertain. She saw orthopedics 2 weeks ago and they were not interested in trying to do investigations on this. Indeed the patient did not seem to be too interested in an aggressive investigations on this. My feeling is that this probably represents a deep infection/abscess perhaps even septic joint that over time has continued to drain through this area. Fortunately the patient has not gotten systemically ill from this. He is not even interested in talking about this currently  let alone investigating Electronic Signature(s) Signed: 05/08/2020 6:08:30 PM By: Linton Ham MD Entered By: Linton Ham on 05/06/2020 14:44:46 -------------------------------------------------------------------------------- HxROS Details Patient Name: Date of Service: [place] RD, [name] 05/06/2020 1:15 PM Medical Record Number: [id number] Patient Account Number: [id number] Date of Birth/Sex: Treating RN: February 18, 1935 (85 y.o. Male) Baruch Gouty Primary Care Provider: Frann Rider Other Clinician: Referring Provider: Treating Provider/Extender: Cheree Ditto in Treatment: 0 Information Obtained From Patient Constitutional Symptoms (General Health) Complaints and  Symptoms: Negative for: Fatigue; Fever; Chills; Marked Weight Change Eyes Complaints and Symptoms: Positive for: Glasses / Contacts - reading Negative for: Dry Eyes; Vision Changes Medical History: Positive for: Cataracts - bil removed Negative for: Glaucoma; Optic Neuritis Ear/Nose/Mouth/Throat Complaints and Symptoms: Positive for: Chronic sinus problems or rhinitis Medical History: Past Medical History Notes: rhinitis, nasal polyps Respiratory Complaints and Symptoms: Negative for: Chronic or frequent coughs; Shortness of Breath Cardiovascular Complaints and Symptoms: Negative for: Chest pain Gastrointestinal Complaints and Symptoms: Negative for: Frequent diarrhea; Nausea; Vomiting Endocrine Complaints and Symptoms: Negative for: Heat/cold intolerance Medical History: Negative for: Type I Diabetes; Type II Diabetes Genitourinary Complaints and Symptoms: Positive for: Frequent urination Medical History: Negative for: End Stage Renal Disease Integumentary (Skin) Complaints and Symptoms: Positive for: Wounds - left hip Medical History: Negative for: History of Burn Musculoskeletal Complaints and Symptoms: Negative for: Muscle Pain; Muscle Weakness Medical History: Positive for:  Osteoarthritis Neurologic Complaints and Symptoms: Negative for: Numbness/parasthesias Psychiatric Complaints and Symptoms: Negative for: Claustrophobia; Suicidal Medical History: Negative for: Anorexia/bulimia; Confinement Anxiety Hematologic/Lymphatic Immunological Oncologic Medical History: Negative for: Received Chemotherapy; Received Radiation Past Medical History Notes: skin ca removed HBO Extended History Items Eyes: Cataracts Immunizations Pneumococcal Vaccine: Received Pneumococcal Vaccination: No Implantable Devices Yes Hospitalization / Surgery History Type of Hospitalization/Surgery left total hip replacement appendectomy bil cataract removed nasal surgery varicose vein surgery bil Family and Social History Cancer: Yes - Siblings; Diabetes: No; Heart Disease: No; Hereditary Spherocytosis: No; Hypertension: No; Kidney Disease: No; Lung Disease: Yes - Siblings; Seizures: No; Stroke: Yes - Siblings; Thyroid Problems: No; Tuberculosis: No; Former smoker - quit >15 yrs ago; Marital Status - Single; Alcohol Use: Never; Drug Use: No History; Caffeine Use: Moderate - tea; Financial Concerns: No; Food, Clothing or Shelter Needs: No; Support System Lacking: No; Transportation Concerns: No Engineer, maintenance) Signed: 05/06/2020 5:09:36 PM By: Baruch Gouty RN, BSN Signed: 05/08/2020 6:08:30 PM By: Linton Ham MD Entered By: Baruch Gouty on 05/06/2020 13:57:36 -------------------------------------------------------------------------------- SuperBill Details Patient Name: Date of Service: [place] RD, [name] 05/06/2020 Medical Record Number: [id number] Patient Account Number: [id number] Date of Birth/Sex: Treating RN: 1935-02-16 (85 y.o. Male) Carlene Coria Primary Care Provider: Frann Rider Other Clinician: Referring Provider: Treating Provider/Extender: Cheree Ditto in Treatment: 0 Diagnosis Coding ICD-10 Codes Code Description L89.159  Pressure ulcer of sacral region, unspecified stage T81.31XD Disruption of external operation (surgical) wound, not elsewhere classified, subsequent encounter Z47.1 Aftercare following joint replacement surgery Facility Procedures CPT4 Code: [id number] Description: 99213 - WOUND CARE VISIT-LEV 3 EST PT Modifier: Quantity: 1 Physician Procedures : CPT4 Code Description Modifier [id number] - WC PHYS LEVEL 2 - NEW PT ICD-10 Diagnosis Description L89.159 Pressure ulcer of sacral region, unspecified stage T81.31XD Disruption of external operation (surgical) wound, not elsewhere classified,  subsequent encounter Quantity: 1 Electronic Signature(s) Signed: 05/08/2020 6:08:30 PM By: Linton Ham MD Signed: 05/09/2020 5:49:17 PM By: Carlene Coria RN Entered By: Carlene Coria on 05/06/2020 16:33:56

## 2020-05-15 DIAGNOSIS — J309 Allergic rhinitis, unspecified: Secondary | ICD-10-CM | POA: Diagnosis not present

## 2020-05-15 DIAGNOSIS — H1013 Acute atopic conjunctivitis, bilateral: Secondary | ICD-10-CM | POA: Diagnosis not present

## 2020-10-20 DIAGNOSIS — I10 Essential (primary) hypertension: Secondary | ICD-10-CM | POA: Diagnosis not present

## 2020-10-20 DIAGNOSIS — D649 Anemia, unspecified: Secondary | ICD-10-CM | POA: Diagnosis not present

## 2020-10-20 DIAGNOSIS — Z0000 Encounter for general adult medical examination without abnormal findings: Secondary | ICD-10-CM | POA: Diagnosis not present

## 2020-10-20 DIAGNOSIS — N400 Enlarged prostate without lower urinary tract symptoms: Secondary | ICD-10-CM | POA: Diagnosis not present

## 2020-10-20 DIAGNOSIS — T8149XS Infection following a procedure, other surgical site, sequela: Secondary | ICD-10-CM | POA: Diagnosis not present

## 2020-10-22 ENCOUNTER — Other Ambulatory Visit (HOSPITAL_COMMUNITY): Payer: Self-pay | Admitting: Family Medicine

## 2020-10-22 DIAGNOSIS — R011 Cardiac murmur, unspecified: Secondary | ICD-10-CM

## 2020-10-28 ENCOUNTER — Encounter (HOSPITAL_COMMUNITY): Payer: Self-pay | Admitting: Family Medicine

## 2020-11-11 ENCOUNTER — Telehealth (HOSPITAL_COMMUNITY): Payer: Self-pay | Admitting: Family Medicine

## 2020-11-11 NOTE — Telephone Encounter (Signed)
Just an FYI. We have made several attempts to contact this patient including sending a letter to schedule or reschedule their echocardiogram. We will be removing the patient from the echo [place].   10/28/20 MAILED LETTER [facility]  10/28/20 Called and NA x3 @ 10:43/[facility]  10/23/20 Called and NA x2 @ 9:27/[facility] no VM available  10/22/20 Called and NAx 1 @ 3:59 and no VM set up/[facility]   Referring Provider informed.     Thank you

## 2020-12-19 DIAGNOSIS — Z1211 Encounter for screening for malignant neoplasm of colon: Secondary | ICD-10-CM | POA: Diagnosis not present

## 2021-01-26 ENCOUNTER — Other Ambulatory Visit (HOSPITAL_COMMUNITY): Payer: Self-pay | Admitting: Family Medicine

## 2021-01-26 DIAGNOSIS — R011 Cardiac murmur, unspecified: Secondary | ICD-10-CM

## 2021-02-23 ENCOUNTER — Other Ambulatory Visit: Payer: Self-pay

## 2021-02-23 ENCOUNTER — Ambulatory Visit (HOSPITAL_COMMUNITY): Payer: Medicare Other | Attending: Cardiovascular Disease

## 2021-02-23 DIAGNOSIS — R011 Cardiac murmur, unspecified: Secondary | ICD-10-CM | POA: Insufficient documentation

## 2021-02-23 LAB — ECHOCARDIOGRAM COMPLETE
AR max vel: 2.76 cm2
AV Area VTI: 2.46 cm2
AV Area mean vel: 2.51 cm2
AV Mean grad: 11 mmHg
AV Peak grad: 19.7 mmHg
Ao pk vel: 2.22 m/s
Area-P 1/2: 2.52 cm2
P 1/2 time: 621 msec
S' Lateral: 3.1 cm

## 2021-02-24 DIAGNOSIS — J309 Allergic rhinitis, unspecified: Secondary | ICD-10-CM | POA: Diagnosis not present

## 2021-02-24 DIAGNOSIS — R2689 Other abnormalities of gait and mobility: Secondary | ICD-10-CM | POA: Diagnosis not present

## 2021-02-24 DIAGNOSIS — M549 Dorsalgia, unspecified: Secondary | ICD-10-CM | POA: Diagnosis not present

## 2021-02-24 DIAGNOSIS — R32 Unspecified urinary incontinence: Secondary | ICD-10-CM | POA: Diagnosis not present

## 2021-03-02 DIAGNOSIS — R2689 Other abnormalities of gait and mobility: Secondary | ICD-10-CM | POA: Diagnosis not present

## 2021-04-21 DIAGNOSIS — D509 Iron deficiency anemia, unspecified: Secondary | ICD-10-CM | POA: Diagnosis not present

## 2021-04-21 DIAGNOSIS — T8189XD Other complications of procedures, not elsewhere classified, subsequent encounter: Secondary | ICD-10-CM | POA: Diagnosis not present

## 2021-04-21 DIAGNOSIS — M545 Low back pain, unspecified: Secondary | ICD-10-CM | POA: Diagnosis not present

## 2021-04-21 DIAGNOSIS — R001 Bradycardia, unspecified: Secondary | ICD-10-CM | POA: Diagnosis not present

## 2021-07-01 ENCOUNTER — Ambulatory Visit (HOSPITAL_BASED_OUTPATIENT_CLINIC_OR_DEPARTMENT_OTHER): Payer: Medicare Other | Admitting: Cardiovascular Disease

## 2021-08-26 DIAGNOSIS — I10 Essential (primary) hypertension: Secondary | ICD-10-CM | POA: Diagnosis not present

## 2021-08-26 DIAGNOSIS — R319 Hematuria, unspecified: Secondary | ICD-10-CM | POA: Diagnosis not present

## 2021-08-26 DIAGNOSIS — M25552 Pain in left hip: Secondary | ICD-10-CM | POA: Diagnosis not present

## 2021-08-26 DIAGNOSIS — D509 Iron deficiency anemia, unspecified: Secondary | ICD-10-CM | POA: Diagnosis not present

## 2021-09-23 DIAGNOSIS — S3992XA Unspecified injury of lower back, initial encounter: Secondary | ICD-10-CM | POA: Diagnosis not present

## 2021-09-23 DIAGNOSIS — W19XXXA Unspecified fall, initial encounter: Secondary | ICD-10-CM | POA: Diagnosis not present

## 2021-09-23 DIAGNOSIS — M25559 Pain in unspecified hip: Secondary | ICD-10-CM | POA: Diagnosis not present

## 2021-10-07 DIAGNOSIS — N3941 Urge incontinence: Secondary | ICD-10-CM | POA: Diagnosis not present

## 2021-10-07 DIAGNOSIS — N401 Enlarged prostate with lower urinary tract symptoms: Secondary | ICD-10-CM | POA: Diagnosis not present

## 2021-10-07 DIAGNOSIS — R351 Nocturia: Secondary | ICD-10-CM | POA: Diagnosis not present

## 2021-10-07 DIAGNOSIS — R3121 Asymptomatic microscopic hematuria: Secondary | ICD-10-CM | POA: Diagnosis not present

## 2021-10-23 DIAGNOSIS — Z0000 Encounter for general adult medical examination without abnormal findings: Secondary | ICD-10-CM | POA: Diagnosis not present

## 2021-10-23 DIAGNOSIS — N400 Enlarged prostate without lower urinary tract symptoms: Secondary | ICD-10-CM | POA: Diagnosis not present

## 2021-10-23 DIAGNOSIS — T8149XA Infection following a procedure, other surgical site, initial encounter: Secondary | ICD-10-CM | POA: Diagnosis not present

## 2021-10-23 DIAGNOSIS — I10 Essential (primary) hypertension: Secondary | ICD-10-CM | POA: Diagnosis not present

## 2021-11-04 DIAGNOSIS — K7689 Other specified diseases of liver: Secondary | ICD-10-CM | POA: Diagnosis not present

## 2021-11-04 DIAGNOSIS — I700 Atherosclerosis of aorta: Secondary | ICD-10-CM | POA: Diagnosis not present

## 2021-11-04 DIAGNOSIS — R3121 Asymptomatic microscopic hematuria: Secondary | ICD-10-CM | POA: Diagnosis not present

## 2021-11-11 ENCOUNTER — Observation Stay (HOSPITAL_BASED_OUTPATIENT_CLINIC_OR_DEPARTMENT_OTHER): Payer: Medicare Other

## 2021-11-11 ENCOUNTER — Inpatient Hospital Stay (HOSPITAL_COMMUNITY)
Admission: EM | Admit: 2021-11-11 | Discharge: 2021-11-17 | DRG: 682 | Disposition: A | Discharge status: Skilled Nursing Facility | Payer: Medicare Other | Attending: Internal Medicine | Admitting: Internal Medicine

## 2021-11-11 ENCOUNTER — Encounter (HOSPITAL_COMMUNITY): Payer: Self-pay

## 2021-11-11 ENCOUNTER — Other Ambulatory Visit: Payer: Self-pay

## 2021-11-11 ENCOUNTER — Emergency Department (HOSPITAL_COMMUNITY): Payer: Medicare Other

## 2021-11-11 ENCOUNTER — Observation Stay (HOSPITAL_COMMUNITY): Payer: Medicare Other

## 2021-11-11 DIAGNOSIS — R4182 Altered mental status, unspecified: Secondary | ICD-10-CM | POA: Diagnosis not present

## 2021-11-11 DIAGNOSIS — D509 Iron deficiency anemia, unspecified: Secondary | ICD-10-CM | POA: Diagnosis present

## 2021-11-11 DIAGNOSIS — E7800 Pure hypercholesterolemia, unspecified: Secondary | ICD-10-CM | POA: Diagnosis not present

## 2021-11-11 DIAGNOSIS — I110 Hypertensive heart disease with heart failure: Secondary | ICD-10-CM | POA: Diagnosis not present

## 2021-11-11 DIAGNOSIS — I77810 Thoracic aortic ectasia: Secondary | ICD-10-CM | POA: Diagnosis not present

## 2021-11-11 DIAGNOSIS — R319 Hematuria, unspecified: Secondary | ICD-10-CM

## 2021-11-11 DIAGNOSIS — B3749 Other urogenital candidiasis: Secondary | ICD-10-CM

## 2021-11-11 DIAGNOSIS — H109 Unspecified conjunctivitis: Secondary | ICD-10-CM | POA: Diagnosis present

## 2021-11-11 DIAGNOSIS — Z85828 Personal history of other malignant neoplasm of skin: Secondary | ICD-10-CM | POA: Diagnosis not present

## 2021-11-11 DIAGNOSIS — Z751 Person awaiting admission to adequate facility elsewhere: Secondary | ICD-10-CM | POA: Diagnosis not present

## 2021-11-11 DIAGNOSIS — G9341 Metabolic encephalopathy: Secondary | ICD-10-CM | POA: Diagnosis present

## 2021-11-11 DIAGNOSIS — I447 Left bundle-branch block, unspecified: Secondary | ICD-10-CM | POA: Diagnosis present

## 2021-11-11 DIAGNOSIS — R55 Syncope and collapse: Secondary | ICD-10-CM | POA: Diagnosis not present

## 2021-11-11 DIAGNOSIS — L89151 Pressure ulcer of sacral region, stage 1: Secondary | ICD-10-CM | POA: Diagnosis not present

## 2021-11-11 DIAGNOSIS — N400 Enlarged prostate without lower urinary tract symptoms: Secondary | ICD-10-CM | POA: Diagnosis present

## 2021-11-11 DIAGNOSIS — R262 Difficulty in walking, not elsewhere classified: Secondary | ICD-10-CM

## 2021-11-11 DIAGNOSIS — T796XXA Traumatic ischemia of muscle, initial encounter: Secondary | ICD-10-CM

## 2021-11-11 DIAGNOSIS — Z87891 Personal history of nicotine dependence: Secondary | ICD-10-CM | POA: Diagnosis not present

## 2021-11-11 DIAGNOSIS — M6259 Muscle wasting and atrophy, not elsewhere classified, multiple sites: Secondary | ICD-10-CM | POA: Diagnosis not present

## 2021-11-11 DIAGNOSIS — Z96642 Presence of left artificial hip joint: Secondary | ICD-10-CM | POA: Diagnosis present

## 2021-11-11 DIAGNOSIS — Z20822 Contact with and (suspected) exposure to covid-19: Secondary | ICD-10-CM | POA: Diagnosis present

## 2021-11-11 DIAGNOSIS — E860 Dehydration: Secondary | ICD-10-CM | POA: Diagnosis present

## 2021-11-11 DIAGNOSIS — T148XXA Other injury of unspecified body region, initial encounter: Secondary | ICD-10-CM

## 2021-11-11 DIAGNOSIS — L89112 Pressure ulcer of right upper back, stage 2: Secondary | ICD-10-CM | POA: Diagnosis present

## 2021-11-11 DIAGNOSIS — N401 Enlarged prostate with lower urinary tract symptoms: Secondary | ICD-10-CM | POA: Diagnosis not present

## 2021-11-11 DIAGNOSIS — R54 Age-related physical debility: Secondary | ICD-10-CM | POA: Diagnosis present

## 2021-11-11 DIAGNOSIS — I5032 Chronic diastolic (congestive) heart failure: Secondary | ICD-10-CM | POA: Diagnosis not present

## 2021-11-11 DIAGNOSIS — R531 Weakness: Secondary | ICD-10-CM | POA: Diagnosis not present

## 2021-11-11 DIAGNOSIS — R7401 Elevation of levels of liver transaminase levels: Secondary | ICD-10-CM

## 2021-11-11 DIAGNOSIS — N189 Chronic kidney disease, unspecified: Secondary | ICD-10-CM

## 2021-11-11 DIAGNOSIS — N179 Acute kidney failure, unspecified: Principal | ICD-10-CM

## 2021-11-11 DIAGNOSIS — Z9181 History of falling: Secondary | ICD-10-CM | POA: Diagnosis not present

## 2021-11-11 DIAGNOSIS — Z7401 Bed confinement status: Secondary | ICD-10-CM | POA: Diagnosis not present

## 2021-11-11 DIAGNOSIS — L89122 Pressure ulcer of left upper back, stage 2: Secondary | ICD-10-CM | POA: Diagnosis present

## 2021-11-11 DIAGNOSIS — M6281 Muscle weakness (generalized): Secondary | ICD-10-CM | POA: Diagnosis not present

## 2021-11-11 DIAGNOSIS — M1711 Unilateral primary osteoarthritis, right knee: Secondary | ICD-10-CM | POA: Diagnosis present

## 2021-11-11 DIAGNOSIS — Z741 Need for assistance with personal care: Secondary | ICD-10-CM | POA: Diagnosis not present

## 2021-11-11 DIAGNOSIS — I10 Essential (primary) hypertension: Secondary | ICD-10-CM | POA: Insufficient documentation

## 2021-11-11 DIAGNOSIS — Z043 Encounter for examination and observation following other accident: Secondary | ICD-10-CM | POA: Diagnosis not present

## 2021-11-11 DIAGNOSIS — R41841 Cognitive communication deficit: Secondary | ICD-10-CM | POA: Diagnosis not present

## 2021-11-11 DIAGNOSIS — M6282 Rhabdomyolysis: Secondary | ICD-10-CM | POA: Diagnosis present

## 2021-11-11 DIAGNOSIS — R350 Frequency of micturition: Secondary | ICD-10-CM | POA: Diagnosis not present

## 2021-11-11 DIAGNOSIS — W19XXXA Unspecified fall, initial encounter: Secondary | ICD-10-CM

## 2021-11-11 DIAGNOSIS — R2681 Unsteadiness on feet: Secondary | ICD-10-CM | POA: Diagnosis not present

## 2021-11-11 DIAGNOSIS — R000 Tachycardia, unspecified: Secondary | ICD-10-CM | POA: Diagnosis not present

## 2021-11-11 DIAGNOSIS — R239 Unspecified skin changes: Secondary | ICD-10-CM | POA: Diagnosis not present

## 2021-11-11 DIAGNOSIS — F0150 Vascular dementia without behavioral disturbance: Secondary | ICD-10-CM | POA: Diagnosis not present

## 2021-11-11 DIAGNOSIS — R1312 Dysphagia, oropharyngeal phase: Secondary | ICD-10-CM | POA: Diagnosis not present

## 2021-11-11 DIAGNOSIS — Z79899 Other long term (current) drug therapy: Secondary | ICD-10-CM

## 2021-11-11 DIAGNOSIS — M25552 Pain in left hip: Secondary | ICD-10-CM | POA: Diagnosis not present

## 2021-11-11 DIAGNOSIS — I5189 Other ill-defined heart diseases: Secondary | ICD-10-CM

## 2021-11-11 DIAGNOSIS — G9340 Encephalopathy, unspecified: Secondary | ICD-10-CM

## 2021-11-11 DIAGNOSIS — R2689 Other abnormalities of gait and mobility: Secondary | ICD-10-CM | POA: Diagnosis present

## 2021-11-11 DIAGNOSIS — R404 Transient alteration of awareness: Secondary | ICD-10-CM | POA: Diagnosis not present

## 2021-11-11 LAB — COMPREHENSIVE METABOLIC PANEL
ALT: 34 U/L (ref 0–44)
AST: 110 U/L — ABNORMAL HIGH (ref 15–41)
Albumin: 2.8 g/dL — ABNORMAL LOW (ref 3.5–5.0)
Alkaline Phosphatase: 58 U/L (ref 38–126)
Anion gap: 13 (ref 5–15)
BUN: 38 mg/dL — ABNORMAL HIGH (ref 8–23)
CO2: 22 mmol/L (ref 22–32)
Calcium: 9.1 mg/dL (ref 8.9–10.3)
Chloride: 102 mmol/L (ref 98–111)
Creatinine, Ser: 1.55 mg/dL — ABNORMAL HIGH (ref 0.61–1.24)
GFR, Estimated: 44 mL/min — ABNORMAL LOW (ref >60)
Glucose, Bld: 91 mg/dL (ref 70–99)
Potassium: 4.5 mmol/L (ref 3.5–5.1)
Sodium: 137 mmol/L (ref 135–145)
Total Bilirubin: 1.7 mg/dL — ABNORMAL HIGH (ref 0.3–1.2)
Total Protein: 7 g/dL (ref 6.5–8.1)

## 2021-11-11 LAB — URINALYSIS, ROUTINE W REFLEX MICROSCOPIC
Bilirubin Urine: NEGATIVE
Glucose, UA: NEGATIVE mg/dL
Ketones, ur: 20 mg/dL — AB
Leukocytes,Ua: NEGATIVE
Nitrite: NEGATIVE
Protein, ur: NEGATIVE mg/dL
Specific Gravity, Urine: 1.017 (ref 1.005–1.030)
pH: 5 (ref 5.0–8.0)

## 2021-11-11 LAB — CBC WITH DIFFERENTIAL/PLATELET
Abs Immature Granulocytes: 0.04 10*3/uL (ref 0.00–0.07)
Basophils Absolute: 0 10*3/uL (ref 0.0–0.1)
Basophils Relative: 0 %
Eosinophils Absolute: 0 10*3/uL (ref 0.0–0.5)
Eosinophils Relative: 0 %
HCT: 36.8 % — ABNORMAL LOW (ref 39.0–52.0)
Hemoglobin: 12.3 g/dL — ABNORMAL LOW (ref 13.0–17.0)
Immature Granulocytes: 0 %
Lymphocytes Relative: 14 %
Lymphs Abs: 1.5 10*3/uL (ref 0.7–4.0)
MCH: 31.4 pg (ref 26.0–34.0)
MCHC: 33.4 g/dL (ref 30.0–36.0)
MCV: 93.9 fL (ref 80.0–100.0)
Monocytes Absolute: 0.7 10*3/uL (ref 0.1–1.0)
Monocytes Relative: 7 %
Neutro Abs: 8.4 10*3/uL — ABNORMAL HIGH (ref 1.7–7.7)
Neutrophils Relative %: 79 %
Platelets: 270 10*3/uL (ref 150–400)
RBC: 3.92 MIL/uL — ABNORMAL LOW (ref 4.22–5.81)
RDW: 12.4 % (ref 11.5–15.5)
WBC: 10.8 10*3/uL — ABNORMAL HIGH (ref 4.0–10.5)
nRBC: 0 % (ref 0.0–0.2)

## 2021-11-11 LAB — ECHOCARDIOGRAM COMPLETE
Area-P 1/2: 3.72 cm2
Height: 69 in
MV M vel: 6.17 m/s
MV Peak grad: 152.3 mmHg
P 1/2 time: 547 msec
Radius: 0.6 cm
S' Lateral: 3.4 cm
Single Plane A4C EF: 45.9 %
Weight: 3199.32 oz

## 2021-11-11 LAB — VITAMIN B12: Vitamin B-12: 1232 pg/mL — ABNORMAL HIGH (ref 180–914)

## 2021-11-11 LAB — CK: Total CK: 1915 U/L — ABNORMAL HIGH (ref 49–397)

## 2021-11-11 LAB — RAPID URINE DRUG SCREEN, HOSP PERFORMED
Amphetamines: NOT DETECTED
Barbiturates: NOT DETECTED
Benzodiazepines: NOT DETECTED
Cocaine: NOT DETECTED
Opiates: NOT DETECTED
Tetrahydrocannabinol: NOT DETECTED

## 2021-11-11 LAB — RESP PANEL BY RT-PCR (FLU A&B, COVID) ARPGX2
Influenza A by PCR: NEGATIVE
Influenza B by PCR: NEGATIVE
SARS Coronavirus 2 by RT PCR: NEGATIVE

## 2021-11-11 LAB — MAGNESIUM: Magnesium: 2.3 mg/dL (ref 1.7–2.4)

## 2021-11-11 LAB — ETHANOL: Alcohol, Ethyl (B): <10 mg/dL (ref <10)

## 2021-11-11 LAB — TSH: TSH: 3.939 u[IU]/mL (ref 0.350–4.500)

## 2021-11-11 LAB — TROPONIN I (HIGH SENSITIVITY)
Troponin I (High Sensitivity): 32 ng/L — ABNORMAL HIGH (ref <18)
Troponin I (High Sensitivity): 38 ng/L — ABNORMAL HIGH (ref <18)

## 2021-11-11 LAB — AMMONIA: Ammonia: 55 umol/L — ABNORMAL HIGH (ref 9–35)

## 2021-11-11 MED ORDER — TAMSULOSIN HCL 0.4 MG PO CAPS
0.4000 mg | ORAL_CAPSULE | Freq: Every day | ORAL | Status: DC
  Administered 2021-11-11 – 2021-11-17 (×7): 0.4 mg via ORAL
  Filled 2021-11-11 (×8): qty 1

## 2021-11-11 MED ORDER — ERYTHROMYCIN 5 MG/GM OP OINT
TOPICAL_OINTMENT | Freq: Four times a day (QID) | OPHTHALMIC | Status: AC
  Administered 2021-11-15: 1 via OPHTHALMIC
  Filled 2021-11-11 (×3): qty 3.5

## 2021-11-11 MED ORDER — SODIUM BICARBONATE 8.4 % IV SOLN
50.0000 meq | Freq: Once | INTRAVENOUS | Status: AC
  Administered 2021-11-11: 50 meq via INTRAVENOUS
  Filled 2021-11-11: qty 50

## 2021-11-11 MED ORDER — LORAZEPAM 2 MG/ML IJ SOLN
1.0000 mg | Freq: Once | INTRAMUSCULAR | Status: AC
  Administered 2021-11-11: 1 mg via INTRAVENOUS
  Filled 2021-11-11: qty 1

## 2021-11-11 MED ORDER — SODIUM CHLORIDE 0.9 % IV BOLUS
1000.0000 mL | Freq: Once | INTRAVENOUS | Status: AC
  Administered 2021-11-11: 1000 mL via INTRAVENOUS

## 2021-11-11 MED ORDER — NAPHAZOLINE-GLYCERIN 0.012-0.25 % OP SOLN
1.0000 [drp] | Freq: Four times a day (QID) | OPHTHALMIC | Status: DC | PRN
  Filled 2021-11-11: qty 15

## 2021-11-11 MED ORDER — SODIUM CHLORIDE 0.9 % IV SOLN: INTRAVENOUS | Status: AC

## 2021-11-11 MED ORDER — ACETAMINOPHEN 650 MG RE SUPP: 650.0000 mg | Freq: Four times a day (QID) | RECTAL | Status: DC | PRN

## 2021-11-11 MED ORDER — ENOXAPARIN SODIUM 40 MG/0.4ML IJ SOSY
40.0000 mg | PREFILLED_SYRINGE | INTRAMUSCULAR | Status: DC
  Administered 2021-11-11 – 2021-11-16 (×6): 40 mg via SUBCUTANEOUS
  Filled 2021-11-11 (×7): qty 0.4

## 2021-11-11 MED ORDER — ACETAMINOPHEN 325 MG PO TABS
650.0000 mg | ORAL_TABLET | Freq: Four times a day (QID) | ORAL | Status: DC | PRN
  Administered 2021-11-16 – 2021-11-17 (×2): 650 mg via ORAL
  Filled 2021-11-11 (×2): qty 2

## 2021-11-11 NOTE — Assessment & Plan Note (Signed)
Followed by urology, Dr. Jeffie Pollock ?CT scan this week and has cystoscopy scheduled  ?

## 2021-11-11 NOTE — Assessment & Plan Note (Signed)
Likely secondary to fall/rhabdo ?Ethanol pending, sister and patient deny any alcohol use ?IVF and follow  ?

## 2021-11-11 NOTE — Assessment & Plan Note (Signed)
?   If injury from fall. Has clear discharge ?Will cover for corneal abrasion with erythromycin ointment x 5 days ?thera tears TID  ?

## 2021-11-11 NOTE — ED Triage Notes (Signed)
Pt arrived via [facility] from home for AMS. Pt's family hasn't been able to get a hold of pt for a week. EMS arrived and found pt on floor. Unknown down time on floor. Per EMS pt c/o left hip pain. Pt has a strong foul urine odor. Pt's penis, scrotum, groin areas bilat  have continuous raised erythematous skin. Pt's right hand is erythematous. Pt's buttocks are excoriated. Pt's face has patches of erythematous raised areas. Pt is A&Ox4. VSS ?

## 2021-11-11 NOTE — Assessment & Plan Note (Signed)
Appears dry on exam and did not know he had DD CHF ?Echo in 02/2021: EF of 65% with normal LVF. Grade 2 DD. mild dilatation of the ascending  ?aorta, measuring 39 mm.  ?Strict I/O ?Received 2L IVF in ED and will continue gentle, time limited IVF x 12 hours as CK is only mildly elevated ? ?

## 2021-11-11 NOTE — Assessment & Plan Note (Signed)
Continue flomax  

## 2021-11-11 NOTE — ED Notes (Signed)
[name] sister [phone] requesting an update on the patient ?

## 2021-11-11 NOTE — Assessment & Plan Note (Signed)
hgb baseline: 10.9-11.4 ?Above baseline, likely hemoconcentrated ?Follow  ?

## 2021-11-11 NOTE — Progress Notes (Signed)
?  Echocardiogram ?2D Echocardiogram has been performed. ? ?Mark Howe ?11/11/2021, 3:04 PM ?

## 2021-11-11 NOTE — H&P (Addendum)
History and Physical    Patient: Mark Howe DOB: 1935-02-01 DOA: 11/11/2021 DOS: the patient was seen and examined on 11/11/2021 PCP: [name], MD  Patient coming from: Home - lives alone   Chief Complaint: fall/confusion   HPI: Mark Howe is a 87 y.o. male with medical history significant of BPH, HTN, IDA, HLD, DD CHF,  hx of hematuria followed by urology who presented to ED after fall and confusion at home today. History from EDP, EMS, sister and patient. He can not remember falling, but does remember going to bed late last night in his normal state. He couldn't tell his sister this. He does remember EMS being there today. He also was mildly confused when EMS found him, but he is alert and answering questions appropriately.   Sister has been trying to get a hold of him for the past week. She last spoke with him a week ago on Tuesday. He was supposed to go for a CT scan for his hematuria so she has been trying to remind him. He told her he did go for this. She went over today and he didn't come to the door so called EMS. Found him on the ground, awake. Covered in feces and urine. Was mildly confused, but has been alert and oriented in ED. Can not remember how or why he fell.   His sister thinks he has been on the ground for a while since covered in feces/urine and there was mail in his mailbox for several days which is unlike him.   He tells me he did fall 3 weeks ago and hit his knee cap and his hand and he feels like he has been unstable since that time. He states he has been having dizzy issues for close to a year,   Denies any headaches, chest pain or palpitations, shortness of breath, stomach pain, N/V/D, dysuria or leg swelling. He does not cook and eats out. He states he eats good. He states his appetite has been good.   Does not smoke or drink alcohol.    ER Course:  vitals: afebrile, bp: 157/79, HR: 76, RR: 18, oxygen: 97% RA Pertinent labs: wbc: 10.8,  hgb: 12.3, BUN: 38, creatinine: 1.55, AST: 110, CK: 1915, UA: moderate hgb, 20 ketones,  CT head: no acute finding CXR: no active disease Hip xray: no acute fracture   Review of Systems: As mentioned in the history of present illness. All other systems reviewed and are negative. Past Medical History:  Diagnosis Date   Arthritis    L hip & R knee- OA   Cancer ([place])    basal cell CA- face   Shortness of breath    Past Surgical History:  Procedure Laterality Date   APPENDECTOMY     [facility]- 2011   EYE SURGERY     cataracts removed, laser (bilateral)   NASAL SINUS SURGERY     [facility]- [facility]'s    TOTAL HIP ARTHROPLASTY  11/24/2011   Procedure: TOTAL HIP ARTHROPLASTY;  Surgeon: Ninetta Lights, MD;  Location: [facility];  Service: Orthopedics;  Laterality: Left;   variose     varicose vein surgery   Social History:  reports that he quit smoking about 12 years ago. He has never used smokeless tobacco. He reports that he does not drink alcohol and does not use drugs.  No Known Allergies  Family History  Problem Relation Age of Onset   Anesthesia problems Neg Hx  Prior to Admission medications   Medication Sig Start Date End Date Taking? Authorizing Provider  Tamsulosin HCl (FLOMAX) 0.4 MG CAPS Take 0.4 mg by mouth daily.    [provider]    Physical Exam: Vitals:   11/11/21 1530 11/11/21 1600 11/11/21 1630 11/11/21 1800  BP: (!) 164/92 135/83 (!) 158/65 (!) 136/91  Pulse: 74 (!) 54 71 68  Resp: (!) 21 20 16 15   Temp:      TempSrc:      SpO2: 92%  96% 95%  Weight:      Height:       General:  Appears calm and comfortable and is in NAD. Erythematous cheeks/forehead.  Eyes:  PERRL-pinpoint pupils, EOMI, normal lids, iris. Right sclera injected with clear drainage.  ENT:  grossly normal hearing, lips & tongue, mmm; no teeth Neck:  no LAD, masses or thyromegaly; no carotid bruits Cardiovascular:  RRR, no m/r/g. No LE edema.  Respiratory:   CTA bilaterally with no  wheezes/rales/rhonchi.  Normal respiratory effort. Abdomen:  soft, NT, ND, NABS Back:   normal alignment, no CVAT Skin:  no rash or induration seen on limited exam. + skin tenting. Dry MM. Erythematous cheeks/forehead.  Musculoskeletal:  grossly normal tone BUE/BLE, good ROM, no bony abnormality Lower extremity:  No LE edema.  Limited foot exam with no ulcerations.  2+ distal pulses. Psychiatric:  grossly normal mood and affect, speech fluent and appropriate, AOx3 Neurologic:  CN 2-12 grossly intact, moves all extremities in coordinated fashion, sensation intact   Radiological Exams on Admission: Independently reviewed - see discussion in A/P where applicable  DG Chest 1 View  Result Date: 11/11/2021 CLINICAL DATA:  Unwitnessed fall. EXAM: CHEST  1 VIEW COMPARISON:  November 25, 2011. FINDINGS: Stable cardiomediastinal silhouette. Elevated right hemidiaphragm is noted. Both lungs are clear. The visualized skeletal structures are unremarkable. IMPRESSION: No active disease. Electronically Signed   By: Marijo Conception M.D.   On: 11/11/2021 11:59   CT Head Wo Contrast  Result Date: 11/11/2021 CLINICAL DATA:  Altered mental status EXAM: CT HEAD WITHOUT CONTRAST TECHNIQUE: Contiguous axial images were obtained from the base of the skull through the vertex without intravenous contrast. RADIATION DOSE REDUCTION: This exam was performed according to the departmental dose-optimization program which includes automated exposure control, adjustment of the mA and/or kV according to patient size and/or use of iterative reconstruction technique. COMPARISON:  None. FINDINGS: Brain: No acute intracranial findings are seen. There are no signs of bleeding. Cortical sulci are prominent. Vascular: Unremarkable. Skull: Unremarkable. Sinuses/Orbits: There is mucosal thickening in the ethmoid sinus. Other: None IMPRESSION: No acute intracranial findings are seen.  Atrophy. Chronic ethmoid sinusitis. Electronically Signed    By: Elmer Picker M.D.   On: 11/11/2021 12:06   ECHOCARDIOGRAM COMPLETE  Result Date: 11/11/2021    ECHOCARDIOGRAM REPORT   Patient Name:   Mark Howe Date of Exam: 11/11/2021 Medical Rec #:  [id number]     Height:       69.0 in Accession #:    [id number]    Weight:       200.0 lb Date of Birth:  1934/12/31      BSA:          2.066 m Patient Age:    16 years      BP:           179/87 mmHg Patient Gender: M             HR:  75 bpm. Exam Location:  Inpatient Procedure: 2D Echo Indications:    syncope  History:        Patient has prior history of Echocardiogram examinations, most                 recent 02/23/2021. Signs/Symptoms:Altered Mental Status; Risk                 Factors:Hypertension and Dyslipidemia.  Sonographer:    Johny Chess RDCS Referring Phys: [id number] Orma Flaming  Sonographer Comments: Image acquisition challenging due to uncooperative patient. IMPRESSIONS  1. Left ventricular ejection fraction, by estimation, is 55 to 60%. The left ventricle has normal function. The left ventricle has no regional wall motion abnormalities. There is moderate left ventricular hypertrophy. Left ventricular diastolic parameters are consistent with Grade I diastolic dysfunction (impaired relaxation).  2. Right ventricular systolic function is normal. The right ventricular size is normal. There is moderately elevated pulmonary artery systolic pressure. The estimated right ventricular systolic pressure is 22.2 mmHg.  3. Left atrial size was mildly dilated.  4. The mitral valve is grossly normal. Mild to moderate mitral valve regurgitation.  5. The aortic valve is calcified. There is mild calcification of the aortic valve. There is mild thickening of the aortic valve. Aortic valve regurgitation is mild. FINDINGS  Left Ventricle: Left ventricular ejection fraction, by estimation, is 55 to 60%. The left ventricle has normal function. The left ventricle has no regional wall motion abnormalities. The left  ventricular internal cavity size was normal in size. There is  moderate left ventricular hypertrophy. Left ventricular diastolic parameters are consistent with Grade I diastolic dysfunction (impaired relaxation). Right Ventricle: The right ventricular size is normal. Right vetricular wall thickness was not well visualized. Right ventricular systolic function is normal. There is moderately elevated pulmonary artery systolic pressure. The tricuspid regurgitant velocity is 3.31 m/s, and with an assumed right atrial pressure of 3 mmHg, the estimated right ventricular systolic pressure is 97.9 mmHg. Left Atrium: Left atrial size was mildly dilated. Right Atrium: Right atrial size was normal in size. Pericardium: There is no evidence of pericardial effusion. Mitral Valve: The mitral valve is grossly normal. Mild to moderate mitral valve regurgitation. Tricuspid Valve: The tricuspid valve is grossly normal. Tricuspid valve regurgitation is mild. Aortic Valve: The aortic valve is calcified. There is mild calcification of the aortic valve. There is mild thickening of the aortic valve. There is mild to moderate aortic valve annular calcification. Aortic valve regurgitation is mild. Aortic regurgitation PHT measures 547 msec. Pulmonic Valve: The pulmonic valve was normal in structure. Pulmonic valve regurgitation is trivial. Aorta: The aortic root and ascending aorta are structurally normal, with no evidence of dilitation. IAS/Shunts: The atrial septum is grossly normal.  LEFT VENTRICLE PLAX 2D LVIDd:         4.80 cm      Diastology LVIDs:         3.40 cm      LV e' medial:    5.33 cm/s LV PW:         1.20 cm      LV E/e' medial:  9.8 LV IVS:        1.40 cm      LV e' lateral:   5.33 cm/s LVOT diam:     1.90 cm      LV E/e' lateral: 9.8 LVOT Area:     2.84 cm  LV Volumes (MOD) LV vol d, MOD A4C: 116.0 ml LV vol s,  MOD A4C: 62.8 ml LV SV MOD A4C:     116.0 ml RIGHT VENTRICLE             IVC RV S prime:     17.30 cm/s  IVC  diam: 2.20 cm TAPSE (M-mode): 2.5 cm LEFT ATRIUM              Index        RIGHT ATRIUM           Index LA diam:        3.70 cm  1.79 cm/m   RA Area:     12.70 cm LA Vol (A2C):   118.0 ml 57.12 ml/m  RA Volume:   22.00 ml  10.65 ml/m LA Vol (A4C):   76.6 ml  37.08 ml/m LA Biplane Vol: 97.4 ml  47.15 ml/m  AORTIC VALVE AI PHT:      547 msec  AORTA Ao Root diam: 3.10 cm Ao Asc diam:  3.30 cm MITRAL VALVE                  TRICUSPID VALVE MV Area (PHT): 3.72 cm       TR Peak grad:   43.8 mmHg MV Decel Time: 204 msec       TR Vmax:        331.00 cm/s MR Peak grad:    152.3 mmHg MR Mean grad:    84.0 mmHg    SHUNTS MR Vmax:         617.00 cm/s  Systemic Diam: 1.90 cm MR Vmean:        420.0 cm/s MR PISA:         2.26 cm MR PISA Eff ROA: 15 mm MR PISA Radius:  0.60 cm MV E velocity: 52.50 cm/s MV A velocity: 95.90 cm/s MV E/A ratio:  0.55 Mertie Moores MD Electronically signed by Mertie Moores MD Signature Date/Time: 11/11/2021/5:12:34 PM    Final    DG Hip Unilat W or Wo Pelvis 2-3 Views Left  Result Date: 11/11/2021 CLINICAL DATA:  Left hip pain after fall. EXAM: DG HIP (WITH OR WITHOUT PELVIS) 2-3V LEFT COMPARISON:  November 24, 2011. FINDINGS: Status post left total hip arthroplasty. No acute fracture or dislocation is noted. IMPRESSION: No acute abnormality seen. Electronically Signed   By: Marijo Conception M.D.   On: 11/11/2021 12:01    EKG: Independently reviewed.  NSR with rate 71, LBBB; nonspecific ST changes with no evidence of acute ischemia Has hx of LBBB  Labs on Admission: I have personally reviewed the available labs and imaging studies at the time of the admission.  Pertinent labs:   wbc: 10.8  hgb: 12.3 BUN: 38 creatinine: 1.55 AST: 110 CK: 1915,  UA: moderate hgb, 20 ketones   Assessment and Plan: * fall with rhabdomyolysis - (present on admission) 87 year old with fall of unknown circumstance/time with rhabdomyolysis. Unknown if syncopal event. Covered in urine and feces. Hx of  hypotension and year long dizziness as well as gait instability.  -observation on telemetry -hx of low blood pressure, check orthostatics -check troponin, not endorsing chest pain. No St changes. Hx of LBBB -echo -CT head with no acute finding, but with history of 1+ year long dizziness, ambulatory dysfunction and fall, MRI brain ordered -continue gentle, time limited IVF at 75cc/hour for 12 hours. Received 2 L IVF bolus in ED and has grade 2 DD.  -strict I/O -repeat CK in AM and checking magnesium -PT/OT/SW  consult   AKI (acute kidney injury) ([place]) Baseline creatinine from OV notes around 1.2 AKI secondary to rhabdo/lying on ground Received 2L bolus in ED, gently time limited IVF with DD CHF UA with hgb-known Strict I/O  Avoid nephrotoxic drugs Trend   Acute encephalopathy Resolved by time he arrived to ED Head CT with no acute finding  Alert and oriented for me, but did think he was at [facility] long  Checking MRI of brain per #1 Infectious etiology low on differential.  Metabolic labs, ethanol and UDS pending   Ambulatory dysfunction Checking MRI, TSH, B12, UDS/ethanol  PT/OT SW consult for placement. Sister states he is unsafe to stay by himself and can not take care of himself.   Conjunctivitis- (present on admission) ? If injury from fall. Has clear discharge Will cover for corneal abrasion with erythromycin ointment x 5 days thera tears TID   Hematuria- (present on admission) Followed by urology, Dr. Jeffie Pollock CT scan this week and has cystoscopy scheduled   Elevated AST (SGOT) Likely secondary to fall/rhabdo Ethanol pending, sister and patient deny any alcohol use IVF and follow   Diastolic dysfunction Appears dry on exam and did not know he had DD CHF Echo in 02/2021: EF of 65% with normal LVF. Grade 2 DD. mild dilatation of the ascending  aorta, measuring 39 mm.  Strict I/O Received 2L IVF in ED and will continue gentle, time limited IVF x 12 hours as CK is only  mildly elevated   Benign prostatic hyperplasia- (present on admission) Continue flomax   Iron deficiency anemia- (present on admission) hgb baseline: 10.9-11.4 Above baseline, likely hemoconcentrated Follow    Advance Care Planning:   Code Status: Full Code   Consults: PT/OT/SW  DVT Prophylaxis: lovenox   Family Communication: sister: [name]: [phone]  Severity of Illness: The appropriate patient status for this patient is OBSERVATION. Observation status is judged to be reasonable and necessary in order to provide the required intensity of service to ensure the patient's safety. The patient's presenting symptoms, physical exam findings, and initial radiographic and laboratory data in the context of their medical condition is felt to place them at decreased risk for further clinical deterioration. Furthermore, it is anticipated that the patient will be medically stable for discharge from the hospital within 2 midnights of admission.   Author: Orma Flaming, MD 11/11/2021 7:26 PM  For on call review [email or web address].

## 2021-11-11 NOTE — Assessment & Plan Note (Addendum)
Resolved by time he arrived to ED ?Head CT with no acute finding  ?Alert and oriented for me, but did think he was at [facility] long  ?Checking MRI of brain per #1 ?Infectious etiology low on differential.  ?Metabolic labs, ethanol and UDS pending  ?

## 2021-11-11 NOTE — Assessment & Plan Note (Addendum)
87 year old with fall of unknown circumstance/time with rhabdomyolysis. Unknown if syncopal event. Covered in urine and feces. Hx of hypotension and year long dizziness as well as gait instability.  ?-observation on telemetry ?-hx of low blood pressure, check orthostatics ?-check troponin, not endorsing chest pain. No St changes. Hx of LBBB ?-echo ?-CT head with no acute finding, but with history of 1+ year long dizziness, ambulatory dysfunction and fall, MRI brain ordered ?-continue gentle, time limited IVF at 75cc/hour for 12 hours. Received 2 L IVF bolus in ED and has grade 2 DD.  ?-strict I/O ?-repeat CK in AM and checking magnesium ?-PT/OT/SW consult  ?

## 2021-11-11 NOTE — ED Provider Notes (Signed)
[facility] EMERGENCY DEPARTMENT Provider Note   CSN: [id number] Arrival date & time: 11/11/21  1057     History  Chief Complaint  Patient presents with   Altered Mental Status    Mark Howe is a 87 y.o. male.  Pt is a 87  yo wm with a hx of htn, bph, iron  deficiency anemia, lbbb, and high cholesterol.  He lives alone and his family has not heard from him in about a week.  They called EMS for a welfare check.  Pt was found on the ground.  It is unclear how long he's been on the ground.  Pt does not know.  Pt complains of left hip pain.  He smells strongly of urine and excoriation to his gu area, so probably at least several hours to days.  Pt's sister gives some additional hx.  She said he was supposed to get a CT scan a week ago because he's had hematuria.  He is scheduled to see urology and have a cystoscopy.  She called him on Tuesday, 2/21 to see how it went.  She could not get ahold of him.  She has called him every day, but still was unable to get ahold of him.  She said that is not so unusual because his home phone does not work and he does not keep his cell phone charged.  She went over to his house this morning and tried to get him to answer the door.  She had a key, but thought he might be dead, she she did not want to open the door.  So, she called EMS.    EMS said the pt is a hoarder and the house was very unclean.  His sister does not feel that he is safe to go back home.  She did say that he's been reporting feeling dizzy for several months.  Left hip pain has been chronic.  She requests a call if anything changes. She is the one who helps him with all his medical issues.      Home Medications Prior to Admission medications   Medication Sig Start Date End Date Taking? Authorizing Provider  Tamsulosin HCl (FLOMAX) 0.4 MG CAPS Take 0.4 mg by mouth daily.    [provider]      Allergies    Patient has no known allergies.    Review of  Systems   Review of Systems  Musculoskeletal:        Left hip pain  All other systems reviewed and are negative.  Physical Exam Updated Vital Signs BP 104/89    Pulse 86    Temp 98.3 F (36.8 C) (Oral)    Resp 18    Ht 5\' 9"  (1.753 m)    Wt 90.7 kg    SpO2 98%    BMI 29.53 kg/m  Physical Exam Vitals and nursing note reviewed.  Constitutional:      Comments: Pt smells strongly of urine.  He is wearing a diaper, but the diaper is full of urine and urine has overflowed.  HENT:     Head: Normocephalic and atraumatic.     Right Ear: External ear normal.     Left Ear: External ear normal.     Nose: Nose normal.     Mouth/Throat:     Mouth: Mucous membranes are dry.  Eyes:     Extraocular Movements: Extraocular movements intact.     Conjunctiva/sclera: Conjunctivae normal.  Pupils: Pupils are equal, round, and reactive to light.  Cardiovascular:     Rate and Rhythm: Normal rate and regular rhythm.     Pulses: Normal pulses.     Heart sounds: Normal heart sounds.  Pulmonary:     Effort: Pulmonary effort is normal.     Breath sounds: Normal breath sounds.  Abdominal:     General: Abdomen is flat. Bowel sounds are normal.     Palpations: Abdomen is soft.  Genitourinary:    Comments: Excoriation/yeast to gu area Musculoskeletal:     Cervical back: Normal range of motion and neck supple.       Legs:  Skin:    Capillary Refill: Capillary refill takes less than 2 seconds.     Comments: Several areas of skin break down  Neurological:     General: No focal deficit present.     Mental Status: He is alert and oriented to person, place, and time.  Psychiatric:        Mood and Affect: Mood normal.        Behavior: Behavior normal.    ED Results / Procedures / Treatments   Labs (all labs ordered are listed, but only abnormal results are displayed) Labs Reviewed  CBC WITH DIFFERENTIAL/PLATELET - Abnormal; Notable for the following components:      Result Value   WBC 10.8 (*)     RBC 3.92 (*)    Hemoglobin 12.3 (*)    HCT 36.8 (*)    Neutro Abs 8.4 (*)    All other components within normal limits  COMPREHENSIVE METABOLIC PANEL - Abnormal; Notable for the following components:   BUN 38 (*)    Creatinine, Ser 1.55 (*)    Albumin 2.8 (*)    AST 110 (*)    Total Bilirubin 1.7 (*)    GFR, Estimated 44 (*)    All other components within normal limits  URINALYSIS, ROUTINE W REFLEX MICROSCOPIC - Abnormal; Notable for the following components:   Hgb urine dipstick MODERATE (*)    Ketones, ur 20 (*)    Bacteria, UA RARE (*)    All other components within normal limits  CK - Abnormal; Notable for the following components:   Total CK 1,915 (*)    All other components within normal limits  RESP PANEL BY RT-PCR (FLU A&B, COVID) ARPGX2  CBG MONITORING, ED    EKG EKG Interpretation  Date/Time:  Wednesday November 11 2021 11:05:20 EST Ventricular Rate:  71 PR Interval:  187 QRS Duration: 156 QT Interval:  472 QTC Calculation: 513 R Axis:   45 Text Interpretation: Sinus rhythm Supraventricular bigeminy Probable left atrial enlargement Left bundle branch block new lbbb from last ekg 10 years ago, but pt does have a hx of lbbb upon epic review Confirmed by Isla Pence [phone]) on 11/11/2021 11:40:47 AM  Radiology DG Chest 1 View  Result Date: 11/11/2021 CLINICAL DATA:  Unwitnessed fall. EXAM: CHEST  1 VIEW COMPARISON:  November 25, 2011. FINDINGS: Stable cardiomediastinal silhouette. Elevated right hemidiaphragm is noted. Both lungs are clear. The visualized skeletal structures are unremarkable. IMPRESSION: No active disease. Electronically Signed   By: Marijo Conception M.D.   On: 11/11/2021 11:59   CT Head Wo Contrast  Result Date: 11/11/2021 CLINICAL DATA:  Altered mental status EXAM: CT HEAD WITHOUT CONTRAST TECHNIQUE: Contiguous axial images were obtained from the base of the skull through the vertex without intravenous contrast. RADIATION DOSE REDUCTION: This exam was  performed according  to the departmental dose-optimization program which includes automated exposure control, adjustment of the mA and/or kV according to patient size and/or use of iterative reconstruction technique. COMPARISON:  None. FINDINGS: Brain: No acute intracranial findings are seen. There are no signs of bleeding. Cortical sulci are prominent. Vascular: Unremarkable. Skull: Unremarkable. Sinuses/Orbits: There is mucosal thickening in the ethmoid sinus. Other: None IMPRESSION: No acute intracranial findings are seen.  Atrophy. Chronic ethmoid sinusitis. Electronically Signed   By: Elmer Picker M.D.   On: 11/11/2021 12:06   DG Hip Unilat W or Wo Pelvis 2-3 Views Left  Result Date: 11/11/2021 CLINICAL DATA:  Left hip pain after fall. EXAM: DG HIP (WITH OR WITHOUT PELVIS) 2-3V LEFT COMPARISON:  November 24, 2011. FINDINGS: Status post left total hip arthroplasty. No acute fracture or dislocation is noted. IMPRESSION: No acute abnormality seen. Electronically Signed   By: Marijo Conception M.D.   On: 11/11/2021 12:01    Procedures Procedures    Medications Ordered in ED Medications  sodium chloride 0.9 % bolus 1,000 mL (1,000 mLs Intravenous New Bag/Given 11/11/21 1133)  sodium bicarbonate injection 50 mEq (50 mEq Intravenous Given 11/11/21 1222)  sodium chloride 0.9 % bolus 1,000 mL (1,000 mLs Intravenous New Bag/Given 11/11/21 1309)    ED Course/ Medical Decision Making/ A&P                           Medical Decision Making Amount and/or Complexity of Data Reviewed Labs: ordered. Radiology: ordered.  Risk Prescription drug management. Decision regarding hospitalization.   This patient presents to the ED for concern of fall, this involves an extensive number of treatment options, and is a complaint that carries with it a high risk of complications and morbidity.  The differential diagnosis includes stroke,    Co morbidities that complicate the patient evaluation  htn, bph, iron   deficiency anemia, lbbb, and high cholesterol.     Additional history obtained:  Additional history obtained from epic chart review External records from outside source obtained and reviewed including sister/EMS report   Lab Tests:  I Ordered, and personally interpreted labs.  The pertinent results include:  aki with Cr 1.55 and GFR 44, cbc with hgb 12.3, ck elevated at 1915   Imaging Studies ordered:  I ordered imaging studies including ct head/cxr/left hip  I independently visualized and interpreted imaging which showed nothing acute I agree with the radiologist interpretation   Cardiac Monitoring:  The patient was maintained on a cardiac monitor.  I personally viewed and interpreted the cardiac monitored which showed an underlying rhythm of: nsr   Medicines ordered and prescription drug management:  I ordered medication including IVFs  for dehydration, 1 amp bicarb for rhabdo  Reevaluation of the patient after these medicines showed that the patient improved I have reviewed the patients home medicines and have made adjustments as needed   Test Considered:  Ct head   Critical Interventions:  fluids   Consultations Obtained:  I requested consultation with the hospitalist (Dr. Rogers Blocker),  and discussed lab and imaging findings as well as pertinent plan - tShe will admit   Problem List / ED Course:  Fall:  hx of balance problems.  No evidence of stroke or fx.   Rhabdo:  pt given ivfs and bicarb    Reevaluation:  After the interventions noted above, I reevaluated the patient and found that they have :improved   Social Determinants of Health:  Lives  alone.  House is quite unclean.   Dispostion:  After consideration of the diagnostic results and the patients response to treatment, I feel that the patent would benefit from admission.          Final Clinical Impression(s) / ED Diagnoses Final diagnoses:  Traumatic rhabdomyolysis, initial encounter  ([place])  Candidiasis of scrotum  Skin excoriation  Hematuria, unspecified type  AKI (acute kidney injury) ([place])  Fall, initial encounter    Rx / DC Orders ED Discharge Orders     None         Isla Pence, MD 11/11/21 1327

## 2021-11-11 NOTE — Assessment & Plan Note (Addendum)
Checking MRI, TSH, B12, UDS/ethanol  ?PT/OT ?SW consult for placement. Sister states he is unsafe to stay by himself and can not take care of himself.  ?

## 2021-11-11 NOTE — ED Notes (Signed)
Pt has intermittent confusion ?

## 2021-11-11 NOTE — ED Notes (Signed)
Patient transported to X-ray 

## 2021-11-11 NOTE — Assessment & Plan Note (Signed)
Baseline creatinine from OV notes around 1.2 ?AKI secondary to rhabdo/lying on ground ?Received 2L bolus in ED, gently time limited IVF with DD CHF ?UA with hgb-known ?Strict I/O  ?Avoid nephrotoxic drugs ?Trend  ?

## 2021-11-12 ENCOUNTER — Inpatient Hospital Stay (HOSPITAL_COMMUNITY): Payer: Medicare Other

## 2021-11-12 DIAGNOSIS — G9341 Metabolic encephalopathy: Secondary | ICD-10-CM | POA: Diagnosis present

## 2021-11-12 DIAGNOSIS — B3749 Other urogenital candidiasis: Secondary | ICD-10-CM | POA: Diagnosis present

## 2021-11-12 DIAGNOSIS — R55 Syncope and collapse: Secondary | ICD-10-CM | POA: Diagnosis not present

## 2021-11-12 DIAGNOSIS — N400 Enlarged prostate without lower urinary tract symptoms: Secondary | ICD-10-CM | POA: Diagnosis present

## 2021-11-12 DIAGNOSIS — D509 Iron deficiency anemia, unspecified: Secondary | ICD-10-CM | POA: Diagnosis present

## 2021-11-12 DIAGNOSIS — Z96642 Presence of left artificial hip joint: Secondary | ICD-10-CM | POA: Diagnosis present

## 2021-11-12 DIAGNOSIS — N401 Enlarged prostate with lower urinary tract symptoms: Secondary | ICD-10-CM | POA: Diagnosis not present

## 2021-11-12 DIAGNOSIS — Z87891 Personal history of nicotine dependence: Secondary | ICD-10-CM | POA: Diagnosis not present

## 2021-11-12 DIAGNOSIS — G9340 Encephalopathy, unspecified: Secondary | ICD-10-CM | POA: Diagnosis not present

## 2021-11-12 DIAGNOSIS — I77810 Thoracic aortic ectasia: Secondary | ICD-10-CM | POA: Diagnosis present

## 2021-11-12 DIAGNOSIS — L89122 Pressure ulcer of left upper back, stage 2: Secondary | ICD-10-CM | POA: Diagnosis present

## 2021-11-12 DIAGNOSIS — I110 Hypertensive heart disease with heart failure: Secondary | ICD-10-CM | POA: Diagnosis present

## 2021-11-12 DIAGNOSIS — N179 Acute kidney failure, unspecified: Principal | ICD-10-CM

## 2021-11-12 DIAGNOSIS — Z20822 Contact with and (suspected) exposure to covid-19: Secondary | ICD-10-CM | POA: Diagnosis present

## 2021-11-12 DIAGNOSIS — I447 Left bundle-branch block, unspecified: Secondary | ICD-10-CM | POA: Diagnosis present

## 2021-11-12 DIAGNOSIS — E7800 Pure hypercholesterolemia, unspecified: Secondary | ICD-10-CM | POA: Diagnosis present

## 2021-11-12 DIAGNOSIS — R54 Age-related physical debility: Secondary | ICD-10-CM | POA: Diagnosis present

## 2021-11-12 DIAGNOSIS — L89112 Pressure ulcer of right upper back, stage 2: Secondary | ICD-10-CM | POA: Diagnosis present

## 2021-11-12 DIAGNOSIS — H109 Unspecified conjunctivitis: Secondary | ICD-10-CM | POA: Diagnosis present

## 2021-11-12 DIAGNOSIS — I5032 Chronic diastolic (congestive) heart failure: Secondary | ICD-10-CM | POA: Diagnosis present

## 2021-11-12 DIAGNOSIS — M1711 Unilateral primary osteoarthritis, right knee: Secondary | ICD-10-CM | POA: Diagnosis present

## 2021-11-12 DIAGNOSIS — M6282 Rhabdomyolysis: Secondary | ICD-10-CM

## 2021-11-12 DIAGNOSIS — Z85828 Personal history of other malignant neoplasm of skin: Secondary | ICD-10-CM | POA: Diagnosis not present

## 2021-11-12 DIAGNOSIS — L89151 Pressure ulcer of sacral region, stage 1: Secondary | ICD-10-CM | POA: Diagnosis present

## 2021-11-12 DIAGNOSIS — E860 Dehydration: Secondary | ICD-10-CM | POA: Diagnosis present

## 2021-11-12 DIAGNOSIS — R350 Frequency of micturition: Secondary | ICD-10-CM

## 2021-11-12 DIAGNOSIS — Z751 Person awaiting admission to adequate facility elsewhere: Secondary | ICD-10-CM | POA: Diagnosis not present

## 2021-11-12 DIAGNOSIS — Z9181 History of falling: Secondary | ICD-10-CM | POA: Diagnosis not present

## 2021-11-12 LAB — CBC
HCT: 34.7 % — ABNORMAL LOW (ref 39.0–52.0)
Hemoglobin: 10.9 g/dL — ABNORMAL LOW (ref 13.0–17.0)
MCH: 31.1 pg (ref 26.0–34.0)
MCHC: 31.4 g/dL (ref 30.0–36.0)
MCV: 98.9 fL (ref 80.0–100.0)
Platelets: 233 10*3/uL (ref 150–400)
RBC: 3.51 MIL/uL — ABNORMAL LOW (ref 4.22–5.81)
RDW: 12.7 % (ref 11.5–15.5)
WBC: 8.9 10*3/uL (ref 4.0–10.5)
nRBC: 0 % (ref 0.0–0.2)

## 2021-11-12 LAB — COMPREHENSIVE METABOLIC PANEL
ALT: 31 U/L (ref 0–44)
AST: 71 U/L — ABNORMAL HIGH (ref 15–41)
Albumin: 2.4 g/dL — ABNORMAL LOW (ref 3.5–5.0)
Alkaline Phosphatase: 50 U/L (ref 38–126)
Anion gap: 8 (ref 5–15)
BUN: 32 mg/dL — ABNORMAL HIGH (ref 8–23)
CO2: 24 mmol/L (ref 22–32)
Calcium: 8.6 mg/dL — ABNORMAL LOW (ref 8.9–10.3)
Chloride: 108 mmol/L (ref 98–111)
Creatinine, Ser: 1.24 mg/dL (ref 0.61–1.24)
GFR, Estimated: 57 mL/min — ABNORMAL LOW (ref >60)
Glucose, Bld: 91 mg/dL (ref 70–99)
Potassium: 4.2 mmol/L (ref 3.5–5.1)
Sodium: 140 mmol/L (ref 135–145)
Total Bilirubin: 0.9 mg/dL (ref 0.3–1.2)
Total Protein: 6 g/dL — ABNORMAL LOW (ref 6.5–8.1)

## 2021-11-12 LAB — CK: Total CK: 554 U/L — ABNORMAL HIGH (ref 49–397)

## 2021-11-12 MED ORDER — SODIUM CHLORIDE 0.9 % IV SOLN: INTRAVENOUS | Status: AC

## 2021-11-12 NOTE — Progress Notes (Signed)
EEG complete - results pending 

## 2021-11-12 NOTE — Procedures (Signed)
Patient Name: Mark Howe  ?MRN: [id number]  ?Epilepsy Attending: Lora Havens  ?Referring Physician/Provider: Jonetta Osgood, MD ?Date: 11/12/2021 ?Duration: 27.03 mins ? ?Patient history: 87 year old male found down with altered mental status.  EEG to evaluate for seizure. ? ?Level of alertness: Awake, asleep ? ?AEDs during EEG study: None ? ?Technical aspects: This EEG study was done with scalp electrodes positioned according to the 10-20 International system of electrode placement. Electrical activity was acquired at a sampling rate of 500Hz  and reviewed with a high frequency filter of 70Hz  and a low frequency filter of 1Hz . EEG data were recorded continuously and digitally stored.  ? ?Description: The posterior dominant rhythm consists of 7.5 Hz activity of moderate voltage (25-35 uV) seen predominantly in posterior head regions, symmetric and reactive to eye opening and eye closing. Sleep was characterized by vertex waves, sleep spindles (12 to 14 Hz), maximal frontocentral region. EEG showed intermittent generalized 5 to 6 Hz theta slowing. Hyperventilation and photic stimulation were not performed.    ? ?ABNORMALITY ?- Intermittent slow, generalized ? ?IMPRESSION: ?This study is suggestive of mild diffuse encephalopathy, nonspecific etiology. No seizures or epileptiform discharges were seen throughout the recording. ? ?Lora Havens  ? ?

## 2021-11-12 NOTE — Care Management Obs Status (Signed)
MEDICARE OBSERVATION STATUS NOTIFICATION ? ? ?Patient Details  ?Name: Mark Howe ?MRN: [id number] ?Date of Birth: 08-10-1935 ? ? ?Medicare Observation Status Notification Given:  Yes ? ? ? ?Fuller Mandril, RN ?11/12/2021, 1:35 PM ?

## 2021-11-12 NOTE — Evaluation (Addendum)
Occupational Therapy Evaluation ?Patient Details ?Name: Mark Howe ?MRN: [id number] ?DOB: 01/13/1935 ?Today's Date: 11/12/2021 ? ? ?History of Present Illness Pt is a 87 y/o male presenting on 3/1 for fall/confusion. Admitted with rhabdomyolysis. CT/MRI negative. PMH includes: arthritis, basal cell CA, SOB, L THA.  ? ?Clinical Impression ?  ?Patient admitted for above and limited by problem list below, including impaired cognition, weakness, pain, decreased activity tolerance.  Pt unable to provide PLOF or home setup, he is confused and disoriented (reports his name is Mark Howe and unable to voice his birthday).  He does not follow commands to engage in functional tasks.  He currently requires +2 total assist for ADLs and bed mobility.  Per chart review, pt lives alone. Based on performance today, believe he will benefit from further OT services while admitted and after dc at SNF level. Will follow.   ? ?Recommendations for follow up therapy are one component of a multi-disciplinary discharge planning process, led by the attending physician.  Recommendations may be updated based on patient status, additional functional criteria and insurance authorization.  ? ?Follow Up Recommendations ? Skilled nursing-short term rehab (<3 hours/day)  ?  ?Assistance Recommended at Discharge Frequent or constant Supervision/Assistance  ?Patient can return home with the following Two people to help with walking and/or transfers;Two people to help with bathing/dressing/bathroom;Assistance with feeding;Assistance with cooking/housework;Direct supervision/assist for medications management;Direct supervision/assist for financial management;Assist for transportation;Help with stairs or ramp for entrance ? ?  ?Functional Status Assessment ? Patient has had a recent decline in their functional status and demonstrates the ability to make significant improvements in function in a reasonable and predictable amount of time.  ?Equipment  Recommendations ? Other (comment) (defer)  ?  ?Recommendations for Other Services Speech consult ? ? ?  ?Precautions / Restrictions Precautions ?Precautions: Fall ?Restrictions ?Weight Bearing Restrictions: No  ? ?  ? ?Mobility Bed Mobility ?Overal bed mobility: Needs Assistance ?Bed Mobility: Rolling ?Rolling: Total assist, +2 for physical assistance, +2 for safety/equipment ?  ?  ?  ?  ?General bed mobility comments: total assist +2 to roll to change soiled sheets, pt unable to follow commands to assist.  +2 total to scoot up and reposition in bed ?  ? ?Transfers ?  ?  ?  ?  ?  ?  ?  ?  ?  ?  ?  ? ?  ?Balance Overall balance assessment: History of Falls ?  ?  ?  ?  ?  ?  ?  ?  ?  ?  ?  ?  ?  ?  ?  ?  ?  ?  ?   ? ?ADL either performed or assessed with clinical judgement  ? ?ADL Overall ADL's : Needs assistance/impaired ?  ?  ?  ?  ?  ?  ?  ?  ?  ?  ?  ?  ?  ?  ?  ?  ?  ?  ?Functional mobility during ADLs: Total assistance;+2 for physical assistance;+2 for safety/equipment ?General ADL Comments: total assist for all self care at this time  ? ? ? ?Vision   ?Vision Assessment?: Vision impaired- to be further tested in functional context ?Additional Comments: pt able to open R eye with redness noted, L eye remains closed throughout session.  ?   ?Perception   ?  ?Praxis   ?  ? ?Pertinent Vitals/Pain Pain Assessment ?Pain Assessment: Faces ?Faces Pain Scale: Hurts even more ?Pain Location: generalized ?Pain Descriptors /  Indicators: Discomfort, Crying ?Pain Intervention(s): Monitored during session, Repositioned, Limited activity within patient's tolerance  ? ? ? ?Hand Dominance   ?  ?Extremity/Trunk Assessment Upper Extremity Assessment ?Upper Extremity Assessment: Difficult to assess due to impaired cognition (weak, resistive to movement and holds arms in flexion) ?  ? ?Cervical / Trunk Assessment ?Cervical / Trunk Assessment: Kyphotic ?  ?Communication Communication ?Communication: Other (comment) (mumbles  incoherently, understood minimally) ?  ?Cognition Arousal/Alertness: Lethargic ?Behavior During Therapy: Anxious ?Overall Cognitive Status: No family/caregiver present to determine baseline cognitive functioning ?Area of Impairment: Orientation, Following commands, Awareness, Problem solving, Safety/judgement ?  ?  ?  ?  ?  ?  ?  ?  ?Orientation Level: Disoriented to, Person, Place, Time, Situation ?  ?  ?Following Commands: Follows one step commands inconsistently ?Safety/Judgement: Decreased awareness of safety, Decreased awareness of deficits ?Awareness: Intellectual ?Problem Solving: Slow processing, Decreased initiation, Difficulty sequencing, Requires verbal cues, Requires tactile cues ?General Comments: pt reports his name is Mark Howe and is unaware of his birthday.  Able to voice he was cold, and pt had urinated on himself.  Poor awareness, not oriented and unable to follow commands. ?  ?  ?General Comments   ? ?  ?Exercises   ?  ?Shoulder Instructions    ? ? ?Home Living Family/patient expects to be discharged to:: Unsure ?Living Arrangements: Alone ?Available Help at Discharge:  (pt unable to report) ?  ?  ?  ?  ?  ?  ?  ?  ?  ?  ?  ?  ?  ?  ?Additional Comments: pt unable to report, per chart sister- [name] reports pt lives alone ?  ? ?  ?Prior Functioning/Environment Prior Level of Function : Patient poor historian/Family not available;History of Falls (last six months) ?  ?  ?  ?  ?  ?  ?  ?ADLs Comments: per chart was living alone, but pt unable to report ?  ? ?  ?  ?OT Problem List: Decreased strength;Decreased range of motion;Decreased activity tolerance;Impaired balance (sitting and/or standing);Impaired vision/perception;Decreased coordination;Decreased cognition;Decreased safety awareness;Decreased knowledge of use of DME or AE;Decreased knowledge of precautions;Cardiopulmonary status limiting activity;Impaired UE functional use;Pain ?  ?   ?OT Treatment/Interventions: Self-care/ADL training;DME and/or  AE instruction;Therapeutic activities;Cognitive remediation/compensation;Visual/perceptual remediation/compensation;Patient/family education;Balance training  ?  ?OT Goals(Current goals can be found in the care plan section) Acute Rehab OT Goals ?Patient Stated Goal: none stated ?OT Goal Formulation: Patient unable to participate in goal setting ?Time For Goal Achievement: 11/26/21 ?Potential to Achieve Goals: Fair  ?OT Frequency: Min 2X/week ?  ? ?Co-evaluation PT/OT/SLP Co-Evaluation/Treatment: Yes ?Reason for Co-Treatment: For patient/therapist safety;To address functional/ADL transfers;Necessary to address cognition/behavior during functional activity ?PT goals addressed during session: Mobility/safety with mobility;Strengthening/ROM ?OT goals addressed during session: ADL's and self-care ?  ? ?  ?AM-PAC OT "6 Clicks" Daily Activity     ?Outcome Measure Help from another person eating meals?: Total ?Help from another person taking care of personal grooming?: Total ?Help from another person toileting, which includes using toliet, bedpan, or urinal?: Total ?Help from another person bathing (including washing, rinsing, drying)?: Total ?Help from another person to put on and taking off regular upper body clothing?: Total ?Help from another person to put on and taking off regular lower body clothing?: Total ?6 Click Score: 6 ?  ?End of Session Nurse Communication: Mobility status ? ?Activity Tolerance: Patient tolerated treatment well ?Patient left: in bed;with call bell/phone within reach ? ?OT Visit Diagnosis: Other abnormalities  of gait and mobility (R26.89);Muscle weakness (generalized) (M62.81);Pain;Other symptoms and signs involving cognitive function;Cognitive communication deficit (R41.841);History of falling (Z91.81) ?Pain - Right/Left:  (generalized)  ?              ?Time: 3414-4360 ?OT Time Calculation (min): 15 min ?Charges:  OT General Charges ?$OT Visit: 1 Visit ?OT Evaluation ?$OT Eval Moderate  Complexity: 1 Mod ? ?Jolaine Artist, OT ?Acute Rehabilitation Services ?Pager [phone] ?Office [phone] ? ? ?Delight Stare ?11/12/2021, 11:09 AM ?

## 2021-11-12 NOTE — Progress Notes (Signed)
?   11/12/21 1556  ?Vitals  ?Temp 98 ?F (36.7 ?C)  ?Temp Source Oral  ?BP (!) 157/40  ?MAP (mmHg) 85  ?BP Location Right Arm  ?BP Method Automatic  ?Patient Position (if appropriate) Lying  ?Pulse Rate 74  ?Pulse Rate Source Monitor  ?Resp 17  ?MEWS COLOR  ?MEWS Score Color Green  ?Oxygen Therapy  ?SpO2 97 %  ?O2 Device Room Air  ?MEWS Score  ?MEWS Temp 0  ?MEWS Systolic 0  ?MEWS Pulse 0  ?MEWS RR 0  ?MEWS LOC 0  ?MEWS Score 0  ? ?Mew admission from ED ?

## 2021-11-12 NOTE — Progress Notes (Addendum)
PROGRESS NOTE        PATIENT DETAILS Name: Mark Howe Age: 87 y.o. Sex: male Date of Birth: [birth year] Admit Date: 11/11/2021 Admitting Physician Orma Flaming, MD [id number], [name], MD  Brief Summary: Patient is a 87 y.o.  male with history of BPH, HTN, HLD-presented to the hospital after being found down covered in urine/feces (family had not heard from him for 1 week)-patient was found to have AKI with rhabdomyolysis and subsequently admitted to the hospitalist service.   Significant Hospital events: 3/1>> found down at house covered in feces/urine-confused-admit to hospitalist service-with AKI/rhabdomyolysis/encephalopathy  Significant imaging studies: 3/1>> MRI brain: No acute intracranial abnormality 3/1>> x-ray left hip: No acute abnormality seen.  Significant microbiology data: 3/1>> COVID/influenza PCR: Negative  Procedures: None  Consults:  None  Subjective: Looks very frail- Lying comfortably in bed.  Appears somewhat confused-although able to follow simple commands.  No family at bedside to collaborate if he is back to his baseline.  Has poor oral intake.  Objective: Vitals: Blood pressure (!) 167/71, pulse 64, temperature 98.3 F (36.8 C), temperature source Oral, resp. rate (!) 21, height 5\' 9"  (1.753 m), weight 90.7 kg, SpO2 96 %.   Exam: Gen Exam:not in any distress-appears confused. HEENT:atraumatic, normocephalic Chest: B/L clear to auscultation anteriorly CVS:S1S2 regular Abdomen:soft non tender, non distended Extremities:no edema Neurology: Non focal Skin: no rash  Pertinent Labs/Radiology: CBC Latest Ref Rng & Units 11/11/2021 11/27/2011 11/26/2011  WBC 4.0 - 10.5 K/uL 10.8(H) 6.5 8.0  Hemoglobin 13.0 - 17.0 g/dL 12.3(L) 7.9(L) 8.0(L)  Hematocrit 39.0 - 52.0 % 36.8(L) 23.2(L) 22.7(L)  Platelets 150 - 400 K/uL 270 177 158    Lab Results  Component Value Date   NA 137 11/11/2021   K 4.5 11/11/2021   CL 102  11/11/2021   CO2 22 11/11/2021      Assessment/Plan: Rhabdomyolysis: Due to being down on the floor for several days-CK level improved downtrending-continue gentle hydration for another 24 hours.  AKI: Likely hemodynamically mediated and from rhabdomyolysis.  Renal function better.  Continue supportive care-avoid nephrotoxic agents.  Due to poor oral intake-continue to hydrate for today.  Acute metabolic encephalopathy: Probably due to AKI/dehydration-although somewhat improved-still confused and not yet at baseline.  MRI brain negative for acute abnormalities.  Unclear what exactly happened at home when he fell and was not able to get up for several days-check EEG.    Fall/found down at home-covered with urine/feces (last contact with family 2/21): Very poor historian-has ongoing encephalopathy.  Claims he remembers falling but does not remember exactly what happened, does not think he lost consciousness.  Acknowledges falling-but does not remember any other issues.   MRI brain negative for CVA.  Await PT/OT eval.  We will need social worker eval-May require SNF.  Conjunctivitis: Already improving-continue erythromycin ophthalmic ointment x5 days total.  Transaminitis: Probably due to rhabdo-improving-follow periodically  BPH/hematuria: Followed by urologist Dr.Wrenn in the outpatient setting-continue Flomax.  Social Issues: Patient has no spouse/children-his Sister [name] is his next of kin.  Spoke to patient's sister at length on 3/2-patient has refused help in the past-has been resistant to going to SNF in the past.  He apparently is a hoarder-lives alone-and has poor living conditions.  His overall functional level has gradually declined over the past several months-patient has had some issues with anemia, hematuria and  BPH.  We will ask social worker to evaluate-awaiting PT/OT eval as well.  BMI: Estimated body mass index is 29.53 kg/m as calculated from the following:   Height as of  this encounter: 5\' 9"  (1.753 m).   Weight as of this encounter: 90.7 kg.   Code status:   Code Status: Full Code   DVT Prophylaxis: enoxaparin (LOVENOX) injection 40 mg Start: 11/11/21 2200 Place TED hose Start: 11/11/21 1345   Family Communication: [name] [phone] over the phone on 3/2   Disposition Plan: Status is: Observation The patient will require care spanning > 2 midnights and should be moved to inpatient because: Ongoing encephalopathy-not yet at baseline-await EEG-lives alone-not safe for discharge home-improving rhabdomyolysis-poor oral intake-needing IVF hydration.   Planned Discharge Destination:Skilled nursing facility   Diet: Diet Order             DIET SOFT Room service appropriate? Yes; Fluid consistency: Thin  Diet effective now                     Antimicrobial agents: Anti-infectives (From admission, onward)    None        MEDICATIONS: Scheduled Meds:  enoxaparin (LOVENOX) injection  40 mg Subcutaneous Q24H   erythromycin   Right Eye Q6H   tamsulosin  0.4 mg Oral QPC supper   Continuous Infusions: PRN Meds:.acetaminophen **OR** acetaminophen, naphazoline-glycerin   I have personally reviewed following labs and imaging studies  LABORATORY DATA: CBC: Recent Labs  Lab 11/11/21 1115  WBC 10.8*  NEUTROABS 8.4*  HGB 12.3*  HCT 36.8*  MCV 93.9  PLT 732    Basic Metabolic Panel: Recent Labs  Lab 11/11/21 1115  NA 137  K 4.5  CL 102  CO2 22  GLUCOSE 91  BUN 38*  CREATININE 1.55*  CALCIUM 9.1  MG 2.3    GFR: Estimated Creatinine Clearance: 39.5 mL/min (A) (by C-G formula based on SCr of 1.55 mg/dL (H)).  Liver Function Tests: Recent Labs  Lab 11/11/21 1115  AST 110*  ALT 34  ALKPHOS 58  BILITOT 1.7*  PROT 7.0  ALBUMIN 2.8*   No results for input(s): LIPASE, AMYLASE in the last 168 hours. Recent Labs  Lab 11/11/21 1515  AMMONIA 55*    Coagulation Profile: No results for  input(s): INR, PROTIME in the last 168 hours.  Cardiac Enzymes: Recent Labs  Lab 11/11/21 1115  CKTOTAL 1,915*    BNP (last 3 results) No results for input(s): PROBNP in the last 8760 hours.  Lipid Profile: No results for input(s): CHOL, HDL, LDLCALC, TRIG, CHOLHDL, LDLDIRECT in the last 72 hours.  Thyroid Function Tests: Recent Labs    11/11/21 1717  TSH 3.939    Anemia Panel: Recent Labs    11/11/21 1717  VITAMINB12 1,232*    Urine analysis:    Component Value Date/Time   COLORURINE YELLOW 11/11/2021 [place] 11/11/2021 1232   LABSPEC 1.017 11/11/2021 1232   PHURINE 5.0 11/11/2021 1232   GLUCOSEU NEGATIVE 11/11/2021 1232   HGBUR MODERATE (A) 11/11/2021 1232   BILIRUBINUR NEGATIVE 11/11/2021 1232   KETONESUR 20 (A) 11/11/2021 1232   PROTEINUR NEGATIVE 11/11/2021 1232   UROBILINOGEN 1.0 11/19/2011 1133   NITRITE NEGATIVE 11/11/2021 1232   LEUKOCYTESUR NEGATIVE 11/11/2021 1232    Sepsis Labs: Lactic Acid, Venous No results found for: LATICACIDVEN  MICROBIOLOGY: Recent Results (from the past 240 hour(s))  Resp Panel by RT-PCR (Flu A&B, Covid) Nasopharyngeal Swab  Status: None   Collection Time: 11/11/21 12:00 PM   Specimen: Nasopharyngeal Swab; Nasopharyngeal(NP) swabs in vial transport medium  Result Value Ref Range Status   SARS Coronavirus 2 by RT PCR NEGATIVE NEGATIVE Final    Comment: (NOTE) SARS-CoV-2 target nucleic acids are NOT DETECTED.  The SARS-CoV-2 RNA is generally detectable in upper respiratory specimens during the acute phase of infection. The lowest concentration of SARS-CoV-2 viral copies this assay can detect is 138 copies/mL. A negative result does not preclude SARS-Cov-2 infection and should not be used as the sole basis for treatment or other patient management decisions. A negative result may occur with  improper specimen collection/handling, submission of specimen other than nasopharyngeal swab, presence of  viral mutation(s) within the areas targeted by this assay, and inadequate number of viral copies(<138 copies/mL). A negative result must be combined with clinical observations, patient history, and epidemiological information. The expected result is Negative.  Fact Sheet for Patients:  EntrepreneurPulse.com.au  Fact Sheet for Healthcare Providers:  IncredibleEmployment.be  This test is no t yet approved or cleared by the [place] FDA and  has been authorized for detection and/or diagnosis of SARS-CoV-2 by FDA under an Emergency Use Authorization (EUA). This EUA will remain  in effect (meaning this test can be used) for the duration of the COVID-19 declaration under Section 564(b)(1) of the Act, 21 U.S.C.section 360bbb-3(b)(1), unless the authorization is terminated  or revoked sooner.       Influenza A by PCR NEGATIVE NEGATIVE Final   Influenza B by PCR NEGATIVE NEGATIVE Final    Comment: (NOTE) The Xpert Xpress SARS-CoV-2/FLU/RSV plus assay is intended as an aid in the diagnosis of influenza from Nasopharyngeal swab specimens and should not be used as a sole basis for treatment. Nasal washings and aspirates are unacceptable for Xpert Xpress SARS-CoV-2/FLU/RSV testing.  Fact Sheet for Patients: EntrepreneurPulse.com.au  Fact Sheet for Healthcare Providers: IncredibleEmployment.be  This test is not yet approved or cleared by the [place] FDA and has been authorized for detection and/or diagnosis of SARS-CoV-2 by FDA under an Emergency Use Authorization (EUA). This EUA will remain in effect (meaning this test can be used) for the duration of the COVID-19 declaration under Section 564(b)(1) of the Act, 21 U.S.C. section 360bbb-3(b)(1), unless the authorization is terminated or revoked.  Performed at [facility], [address], [place], [place] [address]     RADIOLOGY  STUDIES/RESULTS: DG Chest 1 View  Result Date: 11/11/2021 CLINICAL DATA:  Unwitnessed fall. EXAM: CHEST  1 VIEW COMPARISON:  November 25, 2011. FINDINGS: Stable cardiomediastinal silhouette. Elevated right hemidiaphragm is noted. Both lungs are clear. The visualized skeletal structures are unremarkable. IMPRESSION: No active disease. Electronically Signed   By: Marijo Conception M.D.   On: 11/11/2021 11:59   CT Head Wo Contrast  Result Date: 11/11/2021 CLINICAL DATA:  Altered mental status EXAM: CT HEAD WITHOUT CONTRAST TECHNIQUE: Contiguous axial images were obtained from the base of the skull through the vertex without intravenous contrast. RADIATION DOSE REDUCTION: This exam was performed according to the departmental dose-optimization program which includes automated exposure control, adjustment of the mA and/or kV according to patient size and/or use of iterative reconstruction technique. COMPARISON:  None. FINDINGS: Brain: No acute intracranial findings are seen. There are no signs of bleeding. Cortical sulci are prominent. Vascular: Unremarkable. Skull: Unremarkable. Sinuses/Orbits: There is mucosal thickening in the ethmoid sinus. Other: None IMPRESSION: No acute intracranial findings are seen.  Atrophy. Chronic ethmoid sinusitis. Electronically  Signed   By: Elmer Picker M.D.   On: 11/11/2021 12:06   MR BRAIN WO CONTRAST  Result Date: 11/12/2021 CLINICAL DATA:  Syncope EXAM: MRI HEAD WITHOUT CONTRAST TECHNIQUE: Multiplanar, multiecho pulse sequences of the brain and surrounding structures were obtained without intravenous contrast. COMPARISON:  None. FINDINGS: Brain: No acute infarct, mass effect or extra-axial collection. No acute or chronic hemorrhage. There is multifocal hyperintense T2-weighted signal within the white matter. Generalized volume loss without a clear lobar predilection. The midline structures are normal. Vascular: Major flow voids are preserved. Skull and upper cervical spine:  Normal calvarium and skull base. Visualized upper cervical spine and soft tissues are normal. Sinuses/Orbits:No paranasal sinus fluid levels or advanced mucosal thickening. No mastoid or middle ear effusion. Normal orbits. IMPRESSION: 1. No acute intracranial abnormality. 2. Generalized volume loss and findings of chronic small vessel ischemia. Electronically Signed   By: Ulyses Jarred M.D.   On: 11/12/2021 01:16   ECHOCARDIOGRAM COMPLETE  Result Date: 11/11/2021    ECHOCARDIOGRAM REPORT   Patient Name:   HASEEB FIALLOS Date of Exam: 11/11/2021 Medical Rec #:  [id number]     Height:       69.0 in Accession #:    [id number]    Weight:       200.0 lb Date of Birth:  October 30, 1934      BSA:          2.066 m Patient Age:    45 years      BP:           179/87 mmHg Patient Gender: M             HR:           75 bpm. Exam Location:  Inpatient Procedure: 2D Echo Indications:    syncope  History:        Patient has prior history of Echocardiogram examinations, most                 recent 02/23/2021. Signs/Symptoms:Altered Mental Status; Risk                 Factors:Hypertension and Dyslipidemia.  Sonographer:    Johny Chess RDCS Referring Phys: [id number] Orma Flaming  Sonographer Comments: Image acquisition challenging due to uncooperative patient. IMPRESSIONS  1. Left ventricular ejection fraction, by estimation, is 55 to 60%. The left ventricle has normal function. The left ventricle has no regional wall motion abnormalities. There is moderate left ventricular hypertrophy. Left ventricular diastolic parameters are consistent with Grade I diastolic dysfunction (impaired relaxation).  2. Right ventricular systolic function is normal. The right ventricular size is normal. There is moderately elevated pulmonary artery systolic pressure. The estimated right ventricular systolic pressure is 02.5 mmHg.  3. Left atrial size was mildly dilated.  4. The mitral valve is grossly normal. Mild to moderate mitral valve regurgitation.  5.  The aortic valve is calcified. There is mild calcification of the aortic valve. There is mild thickening of the aortic valve. Aortic valve regurgitation is mild. FINDINGS  Left Ventricle: Left ventricular ejection fraction, by estimation, is 55 to 60%. The left ventricle has normal function. The left ventricle has no regional wall motion abnormalities. The left ventricular internal cavity size was normal in size. There is  moderate left ventricular hypertrophy. Left ventricular diastolic parameters are consistent with Grade I diastolic dysfunction (impaired relaxation). Right Ventricle: The right ventricular size is normal. Right vetricular wall thickness was not well visualized. Right ventricular systolic  function is normal. There is moderately elevated pulmonary artery systolic pressure. The tricuspid regurgitant velocity is 3.31 m/s, and with an assumed right atrial pressure of 3 mmHg, the estimated right ventricular systolic pressure is 75.1 mmHg. Left Atrium: Left atrial size was mildly dilated. Right Atrium: Right atrial size was normal in size. Pericardium: There is no evidence of pericardial effusion. Mitral Valve: The mitral valve is grossly normal. Mild to moderate mitral valve regurgitation. Tricuspid Valve: The tricuspid valve is grossly normal. Tricuspid valve regurgitation is mild. Aortic Valve: The aortic valve is calcified. There is mild calcification of the aortic valve. There is mild thickening of the aortic valve. There is mild to moderate aortic valve annular calcification. Aortic valve regurgitation is mild. Aortic regurgitation PHT measures 547 msec. Pulmonic Valve: The pulmonic valve was normal in structure. Pulmonic valve regurgitation is trivial. Aorta: The aortic root and ascending aorta are structurally normal, with no evidence of dilitation. IAS/Shunts: The atrial septum is grossly normal.  LEFT VENTRICLE PLAX 2D LVIDd:         4.80 cm      Diastology LVIDs:         3.40 cm      LV e'  medial:    5.33 cm/s LV PW:         1.20 cm      LV E/e' medial:  9.8 LV IVS:        1.40 cm      LV e' lateral:   5.33 cm/s LVOT diam:     1.90 cm      LV E/e' lateral: 9.8 LVOT Area:     2.84 cm  LV Volumes (MOD) LV vol d, MOD A4C: 116.0 ml LV vol s, MOD A4C: 62.8 ml LV SV MOD A4C:     116.0 ml RIGHT VENTRICLE             IVC RV S prime:     17.30 cm/s  IVC diam: 2.20 cm TAPSE (M-mode): 2.5 cm LEFT ATRIUM              Index        RIGHT ATRIUM           Index LA diam:        3.70 cm  1.79 cm/m   RA Area:     12.70 cm LA Vol (A2C):   118.0 ml 57.12 ml/m  RA Volume:   22.00 ml  10.65 ml/m LA Vol (A4C):   76.6 ml  37.08 ml/m LA Biplane Vol: 97.4 ml  47.15 ml/m  AORTIC VALVE AI PHT:      547 msec  AORTA Ao Root diam: 3.10 cm Ao Asc diam:  3.30 cm MITRAL VALVE                  TRICUSPID VALVE MV Area (PHT): 3.72 cm       TR Peak grad:   43.8 mmHg MV Decel Time: 204 msec       TR Vmax:        331.00 cm/s MR Peak grad:    152.3 mmHg MR Mean grad:    84.0 mmHg    SHUNTS MR Vmax:         617.00 cm/s  Systemic Diam: 1.90 cm MR Vmean:        420.0 cm/s MR PISA:         2.26 cm MR PISA Eff ROA: 15 mm MR PISA Radius:  0.60  cm MV E velocity: 52.50 cm/s MV A velocity: 95.90 cm/s MV E/A ratio:  0.55 Mertie Moores MD Electronically signed by Mertie Moores MD Signature Date/Time: 11/11/2021/5:12:34 PM    Final    DG Hip Unilat W or Wo Pelvis 2-3 Views Left  Result Date: 11/11/2021 CLINICAL DATA:  Left hip pain after fall. EXAM: DG HIP (WITH OR WITHOUT PELVIS) 2-3V LEFT COMPARISON:  November 24, 2011. FINDINGS: Status post left total hip arthroplasty. No acute fracture or dislocation is noted. IMPRESSION: No acute abnormality seen. Electronically Signed   By: Marijo Conception M.D.   On: 11/11/2021 12:01     LOS: 0 days   Oren Binet, MD  [facility]    To contact the attending provider between 7A-7P or the covering provider during after hours 7P-7A, please log into the web site [email or web address] and access  using universal [place] password for that web site. If you do not have the password, please call the hospital operator.  11/12/2021, 9:55 AM

## 2021-11-12 NOTE — Evaluation (Signed)
Physical Therapy Evaluation ?Patient Details ?Name: Mark Howe ?MRN: [id number] ?DOB: Jun 18, 1935 ?Today's Date: 11/12/2021 ? ?History of Present Illness ? Pt is a 87 y/o male presenting on 3/1 for fall/confusion. Admitted with rhabdomyolysis. CT/MRI negative. PMH includes: arthritis, basal cell CA, SOB, L THA. ?  ?Clinical Impression ? Patient presented to the ED on 11/11/21 with AMS after EMS found him down at home consistent with patient's diagnosis of fall with rhabdomyolysis. Pt's impairments include impaired orientation and cognition, problem solving, safety awareness, strength, coordination and sequencing. These impairments are limiting his ability to safely and independently transfer and ambulate. Patient requires total Ax2 with rolling in bed and noted he was very resistive. Pt not oriented to self (name or birth date) during session. Pt mumbles incoherently often. Kept eyes closed during session and crying with mobility. SPT recommending SNF upon D/C due to deficits listed above. PT will continue to follow acutely to maximize pt independence with functional mobility. ?   ?   ? ?Recommendations for follow up therapy are one component of a multi-disciplinary discharge planning process, led by the attending physician.  Recommendations may be updated based on patient status, additional functional criteria and insurance authorization. ? ?Follow Up Recommendations Skilled nursing-short term rehab (<3 hours/day) ? ?  ?Assistance Recommended at Discharge Frequent or constant Supervision/Assistance  ?Patient can return home with the following ? Two people to help with walking and/or transfers;Two people to help with bathing/dressing/bathroom;Direct supervision/assist for medications management;Assistance with feeding;Assistance with cooking/housework;Direct supervision/assist for financial management;Assist for transportation;Help with stairs or ramp for entrance ? ?  ?Equipment Recommendations Other (comment) (TBD pt  progression)  ?Recommendations for Other Services ? Speech consult  ?  ?Functional Status Assessment Patient has had a recent decline in their functional status and demonstrates the ability to make significant improvements in function in a reasonable and predictable amount of time.  ? ?  ?Precautions / Restrictions Precautions ?Precautions: Fall ?Restrictions ?Weight Bearing Restrictions: No  ? ?  ? ?Mobility ? Bed Mobility ?Overal bed mobility: Needs Assistance ?Bed Mobility: Rolling ?Rolling: Total assist, +2 for physical assistance, +2 for safety/equipment ?  ?  ?  ?  ?  ?  ? ?Transfers ?  ?  ?  ?  ?  ?  ?  ?  ?  ?  ?  ? ?Ambulation/Gait ?  ?  ?  ?  ?  ?  ?  ?  ? ?Stairs ?  ?  ?  ?  ?  ? ?Wheelchair Mobility ?  ? ?Modified Rankin (Stroke Patients Only) ?  ? ?  ? ?Balance Overall balance assessment: History of Falls ?  ?  ?  ?  ?  ?  ?  ?  ?  ?  ?  ?  ?  ?  ?  ?  ?  ?  ?   ? ? ? ?Pertinent Vitals/Pain Pain Assessment ?Pain Assessment: Faces ?Faces Pain Scale: Hurts even more ?Pain Location: generalized; pt cognition limited ability to determine ?Pain Descriptors / Indicators: Discomfort, Crying ?Pain Intervention(s): Monitored during session, Repositioned  ? ? ?Home Living Family/patient expects to be discharged to:: Unsure ?Living Arrangements: Alone ?Available Help at Discharge:  (pt unable to report) ?  ?  ?  ?  ?  ?  ?  ?Additional Comments: pt unable to report, per chart sister- Mark Howe reports pt lives alone  ?  ?Prior Function Prior Level of Function : Patient poor historian/Family not available;History of Falls (last  six months) ?  ?  ?  ?  ?  ?  ?  ?ADLs Comments: per chart was living alone, but pt unable to report ?  ? ? ?Hand Dominance  ?   ? ?  ?Extremity/Trunk Assessment  ? Upper Extremity Assessment ?Upper Extremity Assessment: Difficult to assess due to impaired cognition (weak, resistive to movement and holds arms in flexion) ?  ? ?Lower Extremity Assessment ?Lower Extremity Assessment: Defer to PT  evaluation ?RLE Deficits / Details: R greater trochanteric area with mild skin breakdown due to prlonged position in R sided fetal position. ?  ? ?Cervical / Trunk Assessment ?Cervical / Trunk Assessment: Kyphotic  ?Communication  ? Communication: Other (comment) (mumbles incoherently, understood minimally)  ?Cognition Arousal/Alertness: Lethargic (pt sleeping upon beginning of session) ?Behavior During Therapy: Anxious ?Overall Cognitive Status: Impaired/Different from baseline ?Area of Impairment: Orientation, Memory, Safety/judgement, Awareness, Following commands, Problem solving ?  ?  ?  ?  ?  ?  ?  ?  ?Orientation Level: Disoriented to, Person, Place, Time, Situation ?  ?Memory: Decreased short-term memory ?Following Commands: Follows one step commands inconsistently ?  ?Awareness: Intellectual ?Problem Solving: Decreased initiation, Slow processing, Requires tactile cues, Requires verbal cues ?General Comments: Pt not oriented to person; told us his name was Mark Howe? unable to determine if this is a nickname or not. He reports he was born in [birth year], and later reports he was born in [birth year]. Pt did ask [place] what day it was; OT told pt the date and year. Pt reports he does not live alone; unable to determine accuracy due to disorientation and AMS. ?  ?  ? ?  ?General Comments General comments (skin integrity, edema, etc.): mild skin breakdown on R greater troch. ? ?  ?Exercises    ? ?Assessment/Plan  ?  ?PT Assessment Patient needs continued PT services  ?PT Problem List Decreased strength;Decreased range of motion;Decreased activity tolerance;Decreased balance;Decreased cognition;Decreased safety awareness;Pain;Decreased mobility;Decreased coordination ? ?   ?  ?PT Treatment Interventions DME instruction;Gait training;Functional mobility training;Therapeutic activities;Stair training;Therapeutic exercise;Balance training;Neuromuscular re-education;Cognitive remediation;Patient/family education;Modalities   ? ?PT  Goals (Current goals can be found in the Care Plan section)  ?Acute Rehab PT Goals ?Patient Stated Goal: unsure ?PT Goal Formulation: Patient unable to participate in goal setting ?Time For Goal Achievement: 11/19/21 ?Potential to Achieve Goals: Good ? ?  ?Frequency Min 2X/week ?  ? ? ?Co-evaluation PT/OT/SLP Co-Evaluation/Treatment: Yes ?Reason for Co-Treatment: For patient/therapist safety;To address functional/ADL transfers;Necessary to address cognition/behavior during functional activity ?PT goals addressed during session: Mobility/safety with mobility;Strengthening/ROM ?OT goals addressed during session: ADL's and self-care ?  ? ? ?  ?AM-PAC PT "6 Clicks" Mobility  ?Outcome Measure Help needed turning from your back to your side while in a flat bed without using bedrails?: Total ?Help needed moving from lying on your back to sitting on the side of a flat bed without using bedrails?: Total ?Help needed moving to and from a bed to a chair (including a wheelchair)?: Total ?Help needed standing up from a chair using your arms (e.g., wheelchair or bedside chair)?: Total ?Help needed to walk in hospital room?: Total ?Help needed climbing 3-5 steps with a railing? : Total ?6 Click Score: 6 ? ?  ?End of Session   ?Activity Tolerance: Patient limited by lethargy;Other (comment) (limited by AMS) ?Patient left: in bed;with call bell/phone within reach;Other (comment) (in trendelenberg position with [facility] raised for safety; on stretcher in ED) ?Nurse Communication: Mobility status;Other (comment) (orientation status; skin  breakdown) ?PT Visit Diagnosis: Muscle weakness (generalized) (M62.81);History of falling (Z91.81);Dizziness and giddiness (R42);Pain ?Pain - Right/Left: Left ?Pain - part of body: Hip ?  ? ?Time: 8347-5830 ?PT Time Calculation (min) (ACUTE ONLY): 15 min ? ? ?Charges:   PT Evaluation ?$PT Eval Moderate Complexity: 1 Mod ?  ?  ?   ? ? ?Jonne Ply, SPT ? ?Jonne Ply ?11/12/2021, 11:16 AM ? ?

## 2021-11-13 LAB — COMPREHENSIVE METABOLIC PANEL
ALT: 30 U/L (ref 0–44)
AST: 58 U/L — ABNORMAL HIGH (ref 15–41)
Albumin: 2.4 g/dL — ABNORMAL LOW (ref 3.5–5.0)
Alkaline Phosphatase: 48 U/L (ref 38–126)
Anion gap: 6 (ref 5–15)
BUN: 25 mg/dL — ABNORMAL HIGH (ref 8–23)
CO2: 23 mmol/L (ref 22–32)
Calcium: 8.3 mg/dL — ABNORMAL LOW (ref 8.9–10.3)
Chloride: 106 mmol/L (ref 98–111)
Creatinine, Ser: 1.21 mg/dL (ref 0.61–1.24)
GFR, Estimated: 59 mL/min — ABNORMAL LOW (ref >60)
Glucose, Bld: 100 mg/dL — ABNORMAL HIGH (ref 70–99)
Potassium: 4.1 mmol/L (ref 3.5–5.1)
Sodium: 135 mmol/L (ref 135–145)
Total Bilirubin: 0.8 mg/dL (ref 0.3–1.2)
Total Protein: 5.8 g/dL — ABNORMAL LOW (ref 6.5–8.1)

## 2021-11-13 LAB — CBC
HCT: 31 % — ABNORMAL LOW (ref 39.0–52.0)
Hemoglobin: 10.2 g/dL — ABNORMAL LOW (ref 13.0–17.0)
MCH: 31.2 pg (ref 26.0–34.0)
MCHC: 32.9 g/dL (ref 30.0–36.0)
MCV: 94.8 fL (ref 80.0–100.0)
Platelets: 239 10*3/uL (ref 150–400)
RBC: 3.27 MIL/uL — ABNORMAL LOW (ref 4.22–5.81)
RDW: 12.6 % (ref 11.5–15.5)
WBC: 7.2 10*3/uL (ref 4.0–10.5)
nRBC: 0 % (ref 0.0–0.2)

## 2021-11-13 LAB — CK: Total CK: 381 U/L (ref 49–397)

## 2021-11-13 NOTE — Care Management (Signed)
Patient is stating that the ED lost his "Keys" His pants were thrown away , but his sister did grab his wallet  and belt before they  "threw away his pants" Called security in ED in lost and found. They had someone already look for them, they have no keys down there.  ?

## 2021-11-13 NOTE — Progress Notes (Addendum)
PROGRESS NOTE        PATIENT DETAILS Name: Mark Howe Age: 87 y.o. Sex: male Date of Birth: 1935-08-07 Admit Date: 11/11/2021 Admitting Physician Evalee Mutton Kristeen Mans, MD [id number], [name], MD  Brief Summary: Patient is a 87 y.o.  male with history of BPH, HTN, HLD-presented to the hospital after being found down covered in urine/feces (family had not heard from him for 1 week)-patient was found to have AKI with rhabdomyolysis and subsequently admitted to the hospitalist service.   Significant Hospital events: 3/1>> found down at house covered in feces/urine-confused-admit to hospitalist service-with AKI/rhabdomyolysis/encephalopathy  Significant imaging studies: 3/1>> MRI brain: No acute intracranial abnormality 3/1>> x-ray left hip: No acute abnormality seen.  Significant microbiology data: 3/1>> COVID/influenza PCR: Negative  Procedures: 3/2>> EEG: No seizures.  Consults:  None  Subjective: Much more awake and alert today-answers all my questions appropriately.  No family at bedside.  Objective: Vitals: Blood pressure (!) 149/71, pulse 72, temperature (!) 97.4 F (36.3 C), temperature source Oral, resp. rate 19, height 5\' 9"  (1.753 m), weight 90.7 kg, SpO2 90 %.   Exam: Gen Exam:Alert awake-not in any distress HEENT:atraumatic, normocephalic Chest: B/L clear to auscultation anteriorly CVS:S1S2 regular Abdomen:soft non tender, non distended Extremities:no edema Neurology: Non focal Skin: no rash   Pertinent Labs/Radiology: CBC Latest Ref Rng & Units 11/13/2021 11/12/2021 11/11/2021  WBC 4.0 - 10.5 K/uL 7.2 8.9 10.8(H)  Hemoglobin 13.0 - 17.0 g/dL 10.2(L) 10.9(L) 12.3(L)  Hematocrit 39.0 - 52.0 % 31.0(L) 34.7(L) 36.8(L)  Platelets 150 - 400 K/uL 239 233 270    Lab Results  Component Value Date   NA 135 11/13/2021   K 4.1 11/13/2021   CL 106 11/13/2021   CO2 23 11/13/2021     Assessment/Plan: Rhabdomyolysis: Due to being down  on the floor for several days-CK levels have normalized after IVF.  AKI: Likely hemodynamically mediated and from rhabdomyolysis.  Renal function has normalized with supportive care.  Acute metabolic encephalopathy: Probably due to AKI/dehydration-much improved with supportive care.  Suspect he is not far from baseline.  MRI brain/EEG negative.  Maintain delirium precautions as patient is at some risk for hospital induced delirium.  Fall/found down at home-covered with urine/feces (last contact with family 2/21): Very poor historian-does not remember how he ended up on the floor for several days-he does remember falling.  MRI brain/EEG negative for seizures.  He does not think he syncopized.  Appreciate PT/OT eval-plans are for SNF.    Conjunctivitis: Already improving-continue erythromycin ophthalmic ointment x5 days total.  Transaminitis: Probably due to rhabdo-improving-follow periodically  BPH/hematuria: Followed by urologist Dr.Wrenn in the outpatient setting-continue Flomax.  Social Issues: Patient has no spouse/children-his Sister [name] is his next of kin.  Spoke to patient's sister at length on 3/2-patient has refused help in the past-has been resistant to going to SNF in the past.  He apparently is a hoarder-lives alone-and has poor living conditions.  His overall functional level has gradually declined over the past several months-patient has had some issues with anemia, hematuria and BPH.  Plans are for SNF on discharge.  Pressure Injury Pressure Injury 11/12/21 Coccyx Right;Left Stage 1 -  Intact skin with non-blanchable redness of a localized area usually over a bony prominence. (Active)  11/12/21 1828  Location: Coccyx  Location Orientation: Right;Left  Staging: Stage 1 -  Intact  skin with non-blanchable redness of a localized area usually over a bony prominence.  Wound Description (Comments):   Present on Admission: Yes     Pressure Injury 11/12/21 Vertebral column  Upper;Right;Left Stage 2 -  Partial thickness loss of dermis presenting as a shallow open injury with a red, pink wound bed without slough. (Active)  11/12/21 1829  Location: Vertebral column  Location Orientation: Upper;Right;Left  Staging: Stage 2 -  Partial thickness loss of dermis presenting as a shallow open injury with a red, pink wound bed without slough.  Wound Description (Comments):   Present on Admission: Yes        BMI: Estimated body mass index is 29.53 kg/m as calculated from the following:   Height as of this encounter: 5\' 9"  (1.753 m).   Weight as of this encounter: 90.7 kg.   Code status:   Code Status: Full Code   DVT Prophylaxis: enoxaparin (LOVENOX) injection 40 mg Start: 11/11/21 2200 Place TED hose Start: 11/11/21 1345   Family Communication: [name] [phone] over the phone on 3/3   Disposition Plan: Status is: Observation The patient will require care spanning > 2 midnights and should be moved to inpatient because: Ongoing encephalopathy-not yet at baseline-await EEG-lives alone-not safe for discharge home-improving rhabdomyolysis-poor oral intake-needing IVF hydration.   Planned Discharge Destination:Skilled nursing facility   Diet: Diet Order             DIET SOFT Room service appropriate? Yes; Fluid consistency: Thin  Diet effective now                     Antimicrobial agents: Anti-infectives (From admission, onward)    None        MEDICATIONS: Scheduled Meds:  enoxaparin (LOVENOX) injection  40 mg Subcutaneous Q24H   erythromycin   Right Eye Q6H   tamsulosin  0.4 mg Oral QPC supper   Continuous Infusions: PRN Meds:.acetaminophen **OR** acetaminophen, naphazoline-glycerin   I have personally reviewed following labs and imaging studies  LABORATORY DATA: CBC: Recent Labs  Lab 11/11/21 1115 11/12/21 1045 11/13/21 0213  WBC 10.8* 8.9 7.2  NEUTROABS 8.4*  --   --   HGB 12.3* 10.9* 10.2*  HCT  36.8* 34.7* 31.0*  MCV 93.9 98.9 94.8  PLT 270 233 239     Basic Metabolic Panel: Recent Labs  Lab 11/11/21 1115 11/12/21 1045 11/13/21 0213  NA 137 140 135  K 4.5 4.2 4.1  CL 102 108 106  CO2 22 24 23   GLUCOSE 91 91 100*  BUN 38* 32* 25*  CREATININE 1.55* 1.24 1.21  CALCIUM 9.1 8.6* 8.3*  MG 2.3  --   --      GFR: Estimated Creatinine Clearance: 50.6 mL/min (by C-G formula based on SCr of 1.21 mg/dL).  Liver Function Tests: Recent Labs  Lab 11/11/21 1115 11/12/21 1045 11/13/21 0213  AST 110* 71* 58*  ALT 34 31 30  ALKPHOS 58 50 48  BILITOT 1.7* 0.9 0.8  PROT 7.0 6.0* 5.8*  ALBUMIN 2.8* 2.4* 2.4*    No results for input(s): LIPASE, AMYLASE in the last 168 hours. Recent Labs  Lab 11/11/21 1515  AMMONIA 55*     Coagulation Profile: No results for input(s): INR, PROTIME in the last 168 hours.  Cardiac Enzymes: Recent Labs  Lab 11/11/21 1115 11/12/21 1045 11/13/21 0213  CKTOTAL 1,915* 554* 381     BNP (last 3 results) No results for input(s): PROBNP in the last 8760 hours.  Lipid Profile: No results for input(s): CHOL, HDL, LDLCALC, TRIG, CHOLHDL, LDLDIRECT in the last 72 hours.  Thyroid Function Tests: Recent Labs    11/11/21 1717  TSH 3.939     Anemia Panel: Recent Labs    11/11/21 1717  VITAMINB12 1,232*     Urine analysis:    Component Value Date/Time   COLORURINE YELLOW 11/11/2021 1232   APPEARANCEUR CLEAR 11/11/2021 1232   LABSPEC 1.017 11/11/2021 1232   PHURINE 5.0 11/11/2021 1232   GLUCOSEU NEGATIVE 11/11/2021 1232   HGBUR MODERATE (A) 11/11/2021 1232   BILIRUBINUR NEGATIVE 11/11/2021 1232   KETONESUR 20 (A) 11/11/2021 1232   PROTEINUR NEGATIVE 11/11/2021 1232   UROBILINOGEN 1.0 11/19/2011 1133   NITRITE NEGATIVE 11/11/2021 1232   LEUKOCYTESUR NEGATIVE 11/11/2021 1232    Sepsis Labs: Lactic Acid, Venous No results found for: LATICACIDVEN  MICROBIOLOGY: Recent Results (from the past 240 hour(s))  Resp Panel  by RT-PCR (Flu A&B, Covid) Nasopharyngeal Swab     Status: None   Collection Time: 11/11/21 12:00 PM   Specimen: Nasopharyngeal Swab; Nasopharyngeal(NP) swabs in vial transport medium  Result Value Ref Range Status   SARS Coronavirus 2 by RT PCR NEGATIVE NEGATIVE Final    Comment: (NOTE) SARS-CoV-2 target nucleic acids are NOT DETECTED.  The SARS-CoV-2 RNA is generally detectable in upper respiratory specimens during the acute phase of infection. The lowest concentration of SARS-CoV-2 viral copies this assay can detect is 138 copies/mL. A negative result does not preclude SARS-Cov-2 infection and should not be used as the sole basis for treatment or other patient management decisions. A negative result may occur with  improper specimen collection/handling, submission of specimen other than nasopharyngeal swab, presence of viral mutation(s) within the areas targeted by this assay, and inadequate number of viral copies(<138 copies/mL). A negative result must be combined with clinical observations, patient history, and epidemiological information. The expected result is Negative.  Fact Sheet for Patients:  EntrepreneurPulse.com.au  Fact Sheet for Healthcare Providers:  IncredibleEmployment.be  This test is no t yet approved or cleared by the [place] FDA and  has been authorized for detection and/or diagnosis of SARS-CoV-2 by FDA under an Emergency Use Authorization (EUA). This EUA will remain  in effect (meaning this test can be used) for the duration of the COVID-19 declaration under Section 564(b)(1) of the Act, 21 U.S.C.section 360bbb-3(b)(1), unless the authorization is terminated  or revoked sooner.       Influenza A by PCR NEGATIVE NEGATIVE Final   Influenza B by PCR NEGATIVE NEGATIVE Final    Comment: (NOTE) The Xpert Xpress SARS-CoV-2/FLU/RSV plus assay is intended as an aid in the diagnosis of influenza from Nasopharyngeal swab  specimens and should not be used as a sole basis for treatment. Nasal washings and aspirates are unacceptable for Xpert Xpress SARS-CoV-2/FLU/RSV testing.  Fact Sheet for Patients: EntrepreneurPulse.com.au  Fact Sheet for Healthcare Providers: IncredibleEmployment.be  This test is not yet approved or cleared by the [place] FDA and has been authorized for detection and/or diagnosis of SARS-CoV-2 by FDA under an Emergency Use Authorization (EUA). This EUA will remain in effect (meaning this test can be used) for the duration of the COVID-19 declaration under Section 564(b)(1) of the Act, 21 U.S.C. section 360bbb-3(b)(1), unless the authorization is terminated or revoked.  Performed at [facility], [place] [address] [place], [place] [address]     RADIOLOGY STUDIES/RESULTS: MR BRAIN WO CONTRAST  Result Date: 11/12/2021 CLINICAL DATA:  Syncope EXAM: MRI  HEAD WITHOUT CONTRAST TECHNIQUE: Multiplanar, multiecho pulse sequences of the brain and surrounding structures were obtained without intravenous contrast. COMPARISON:  None. FINDINGS: Brain: No acute infarct, mass effect or extra-axial collection. No acute or chronic hemorrhage. There is multifocal hyperintense T2-weighted signal within the white matter. Generalized volume loss without a clear lobar predilection. The midline structures are normal. Vascular: Major flow voids are preserved. Skull and upper cervical spine: Normal calvarium and skull base. Visualized upper cervical spine and soft tissues are normal. Sinuses/Orbits:No paranasal sinus fluid levels or advanced mucosal thickening. No mastoid or middle ear effusion. Normal orbits. IMPRESSION: 1. No acute intracranial abnormality. 2. Generalized volume loss and findings of chronic small vessel ischemia. Electronically Signed   By: Ulyses Jarred M.D.   On: 11/12/2021 01:16   EEG adult  Result Date: 11/12/2021 Lora Havens, MD      11/12/2021  3:25 PM Patient Name: Bocephus Cali Berland MRN: [id number] Epilepsy Attending: Lora Havens Referring Physician/Provider: Jonetta Osgood, MD Date: 11/12/2021 Duration: 27.03 mins Patient history: 87 year old male found down with altered mental status.  EEG to evaluate for seizure. Level of alertness: Awake, asleep AEDs during EEG study: None Technical aspects: This EEG study was done with scalp electrodes positioned according to the 10-20 International system of electrode placement. Electrical activity was acquired at a sampling rate of 500Hz  and reviewed with a high frequency filter of 70Hz  and a low frequency filter of 1Hz . EEG data were recorded continuously and digitally stored. Description: The posterior dominant rhythm consists of 7.5 Hz activity of moderate voltage (25-35 uV) seen predominantly in posterior head regions, symmetric and reactive to eye opening and eye closing. Sleep was characterized by vertex waves, sleep spindles (12 to 14 Hz), maximal frontocentral region. EEG showed intermittent generalized 5 to 6 Hz theta slowing. Hyperventilation and photic stimulation were not performed.   ABNORMALITY - Intermittent slow, generalized IMPRESSION: This study is suggestive of mild diffuse encephalopathy, nonspecific etiology. No seizures or epileptiform discharges were seen throughout the recording. Lora Havens   ECHOCARDIOGRAM COMPLETE  Result Date: 11/11/2021    ECHOCARDIOGRAM REPORT   Patient Name:   AADIN GAUT Date of Exam: 11/11/2021 Medical Rec #:  [id number]     Height:       69.0 in Accession #:    [id number]    Weight:       200.0 lb Date of Birth:  1935-02-12      BSA:          2.066 m Patient Age:    36 years      BP:           179/87 mmHg Patient Gender: M             HR:           75 bpm. Exam Location:  Inpatient Procedure: 2D Echo Indications:    syncope  History:        Patient has prior history of Echocardiogram examinations, most                 recent 02/23/2021.  Signs/Symptoms:Altered Mental Status; Risk                 Factors:Hypertension and Dyslipidemia.  Sonographer:    Johny Chess RDCS Referring Phys: [id number] Orma Flaming  Sonographer Comments: Image acquisition challenging due to uncooperative patient. IMPRESSIONS  1. Left ventricular ejection fraction, by estimation, is 55 to 60%. The left ventricle has normal function.  The left ventricle has no regional wall motion abnormalities. There is moderate left ventricular hypertrophy. Left ventricular diastolic parameters are consistent with Grade I diastolic dysfunction (impaired relaxation).  2. Right ventricular systolic function is normal. The right ventricular size is normal. There is moderately elevated pulmonary artery systolic pressure. The estimated right ventricular systolic pressure is 22.9 mmHg.  3. Left atrial size was mildly dilated.  4. The mitral valve is grossly normal. Mild to moderate mitral valve regurgitation.  5. The aortic valve is calcified. There is mild calcification of the aortic valve. There is mild thickening of the aortic valve. Aortic valve regurgitation is mild. FINDINGS  Left Ventricle: Left ventricular ejection fraction, by estimation, is 55 to 60%. The left ventricle has normal function. The left ventricle has no regional wall motion abnormalities. The left ventricular internal cavity size was normal in size. There is  moderate left ventricular hypertrophy. Left ventricular diastolic parameters are consistent with Grade I diastolic dysfunction (impaired relaxation). Right Ventricle: The right ventricular size is normal. Right vetricular wall thickness was not well visualized. Right ventricular systolic function is normal. There is moderately elevated pulmonary artery systolic pressure. The tricuspid regurgitant velocity is 3.31 m/s, and with an assumed right atrial pressure of 3 mmHg, the estimated right ventricular systolic pressure is 79.8 mmHg. Left Atrium: Left atrial size was  mildly dilated. Right Atrium: Right atrial size was normal in size. Pericardium: There is no evidence of pericardial effusion. Mitral Valve: The mitral valve is grossly normal. Mild to moderate mitral valve regurgitation. Tricuspid Valve: The tricuspid valve is grossly normal. Tricuspid valve regurgitation is mild. Aortic Valve: The aortic valve is calcified. There is mild calcification of the aortic valve. There is mild thickening of the aortic valve. There is mild to moderate aortic valve annular calcification. Aortic valve regurgitation is mild. Aortic regurgitation PHT measures 547 msec. Pulmonic Valve: The pulmonic valve was normal in structure. Pulmonic valve regurgitation is trivial. Aorta: The aortic root and ascending aorta are structurally normal, with no evidence of dilitation. IAS/Shunts: The atrial septum is grossly normal.  LEFT VENTRICLE PLAX 2D LVIDd:         4.80 cm      Diastology LVIDs:         3.40 cm      LV e' medial:    5.33 cm/s LV PW:         1.20 cm      LV E/e' medial:  9.8 LV IVS:        1.40 cm      LV e' lateral:   5.33 cm/s LVOT diam:     1.90 cm      LV E/e' lateral: 9.8 LVOT Area:     2.84 cm  LV Volumes (MOD) LV vol d, MOD A4C: 116.0 ml LV vol s, MOD A4C: 62.8 ml LV SV MOD A4C:     116.0 ml RIGHT VENTRICLE             IVC RV S prime:     17.30 cm/s  IVC diam: 2.20 cm TAPSE (M-mode): 2.5 cm LEFT ATRIUM              Index        RIGHT ATRIUM           Index LA diam:        3.70 cm  1.79 cm/m   RA Area:     12.70 cm LA Vol (A2C):   118.0 ml  57.12 ml/m  RA Volume:   22.00 ml  10.65 ml/m LA Vol (A4C):   76.6 ml  37.08 ml/m LA Biplane Vol: 97.4 ml  47.15 ml/m  AORTIC VALVE AI PHT:      547 msec  AORTA Ao Root diam: 3.10 cm Ao Asc diam:  3.30 cm MITRAL VALVE                  TRICUSPID VALVE MV Area (PHT): 3.72 cm       TR Peak grad:   43.8 mmHg MV Decel Time: 204 msec       TR Vmax:        331.00 cm/s MR Peak grad:    152.3 mmHg MR Mean grad:    84.0 mmHg    SHUNTS MR Vmax:          617.00 cm/s  Systemic Diam: 1.90 cm MR Vmean:        420.0 cm/s MR PISA:         2.26 cm MR PISA Eff ROA: 15 mm MR PISA Radius:  0.60 cm MV E velocity: 52.50 cm/s MV A velocity: 95.90 cm/s MV E/A ratio:  0.55 Mertie Moores MD Electronically signed by Mertie Moores MD Signature Date/Time: 11/11/2021/5:12:34 PM    Final      LOS: 1 day   Oren Binet, MD  [facility]    To contact the attending provider between 7A-7P or the covering provider during after hours 7P-7A, please log into the web site [email or web address] and access using universal [place] password for that web site. If you do not have the password, please call the hospital operator.  11/13/2021, 2:15 PM

## 2021-11-13 NOTE — [place] FL2 (Signed)
?[place] MEDICAID FL2 LEVEL OF CARE SCREENING TOOL  ?  ? ?IDENTIFICATION  ?Patient Name: ?Mark Howe Birthdate: 1935-01-23 Sex: male Admission Date (Current Location): ?11/11/2021  ?[place] and [place] Number: ? [place] ?  Facility and Address:  ?[facility], [place] [address] [place], [place] [address] ?     Provider Number: ?[id number]  ?Attending Physician Name and Address:  ?Jonetta Osgood, MD ? Relative Name and Phone Number:  ?  ?   ?Current Level of Care: ?Hospital Recommended Level of Care: ?[place] Prior Approval Number: ?  ? ?Date Approved/Denied: ?  PASRR Number: ?[id number] A ? ?Discharge Plan: ?SNF ?  ? ?Current Diagnoses: ?Patient Active Problem List  ? Diagnosis Date Noted  ? Acute metabolic encephalopathy 64/40/3474  ? Hypertension 11/11/2021  ? Benign prostatic hyperplasia 11/11/2021  ? Iron deficiency anemia 11/11/2021  ? Pure hypercholesterolemia 11/11/2021  ? fall with rhabdomyolysis  11/11/2021  ? Hematuria 11/11/2021  ? AKI (acute kidney injury) ([place]) 11/11/2021  ? Diastolic dysfunction 25/95/6387  ? Ambulatory dysfunction 11/11/2021  ? Conjunctivitis 11/11/2021  ? Elevated AST (SGOT) 11/11/2021  ? Acute encephalopathy 11/11/2021  ? ? ?Orientation RESPIRATION BLADDER Height & Weight   ?  ?Self, Time, Situation, Place (Some delayed responses) ? Normal Incontinent, External catheter Weight: 199 lb 15.3 oz (90.7 kg) ?Height:  5\' 9"  (175.3 cm)  ?BEHAVIORAL SYMPTOMS/MOOD NEUROLOGICAL BOWEL NUTRITION STATUS  ?    Continent Diet (See dc summary)  ?AMBULATORY STATUS COMMUNICATION OF NEEDS Skin   ?Extensive Assist Verbally PU Stage and Appropriate Care (Stage I on coccyx w foam dressing; Stage II on vertebral column w/foam dressing) ?  ?  ?  ?    ?     ?     ? ? ?Personal Care Assistance Level of Assistance  ?Bathing, Feeding, Dressing Bathing Assistance: Maximum assistance ?Feeding assistance: Limited assistance ?Dressing Assistance: Limited assistance ?    ? ?Functional Limitations Info  ?    ?  ?   ? ? ?SPECIAL CARE FACTORS FREQUENCY  ?PT (By licensed PT), OT (By licensed OT)   ?  ?PT Frequency: 5x/week ?OT Frequency: 5x/week ?  ?  ?  ?   ? ? ?Contractures Contractures Info: Not present  ? ? ?Additional Factors Info  ?Code Status, Allergies Code Status Info: Full ?Allergies Info: NKA ?  ?  ?  ?   ? ?Current Medications (11/13/2021):  This is the current hospital active medication list ?Current Facility-Administered Medications  ?Medication Dose Route Frequency Provider Last Rate Last Admin  ? 0.9 %  sodium chloride infusion   Intravenous Continuous Jonetta Osgood, MD 75 mL/hr at 11/12/21 1759 New Bag at 11/12/21 1759  ? acetaminophen (TYLENOL) tablet 650 mg  650 mg Oral Q6H PRN Orma Flaming, MD      ? Or  ? acetaminophen (TYLENOL) suppository 650 mg  650 mg Rectal Q6H PRN Orma Flaming, MD      ? enoxaparin (LOVENOX) injection 40 mg  40 mg Subcutaneous Q24H Orma Flaming, MD   40 mg at 11/12/21 2123  ? erythromycin ophthalmic ointment   Right Eye Q6H Orma Flaming, MD   Given at 11/13/21 5643  ? naphazoline-glycerin (CLEAR EYES REDNESS) ophth solution 1-2 drop  1-2 drop Right Eye QID PRN Orma Flaming, MD      ? tamsulosin [facility]) capsule 0.4 mg  0.4 mg Oral QPC supper Orma Flaming, MD   0.4 mg at 11/12/21 1752  ? ? ? ?  Discharge Medications: ?Please see discharge summary for a list of discharge medications. ? ?Relevant Imaging Results: ? ?Relevant Lab Results: ? ? ?Additional Information ?SSN: [id number]. Pfizer vaccines 09/26/19, 10/18/19, 06/10/20 ? ?Lissa Morales Jamera Vanloan, LCSW ? ? ? ? ?

## 2021-11-13 NOTE — Progress Notes (Signed)
Overnight:  ? ?Pt was very hungry.  Appeared to do well overall with soft foods.  Pt eats quickly, often adding more food to his mouth before he finishes chewing his first bite.  Without coaching, he did have a clearing cough. I prompted him many times to slow down and chew his food.  Pt appears to have no sx of aspiration when he does so.  Pt was prompted to cough between bites several times.  Pt remained upright for 40 mins post meal.   ?

## 2021-11-13 NOTE — TOC Initial Note (Addendum)
Transition of Care (TOC) - Initial/Assessment Note  ? ? ?Patient Details  ?Name: Mark Howe ?MRN: [id number] ?Date of Birth: Jan 18, 1935 ? ?Transition of Care (TOC) CM/SW Contact:    ?Benard Halsted, LCSW ?Phone Number: ?11/13/2021, 4:14 PM ? ?Clinical Narrative:                 ?CSW received consult for possible SNF placement at time of discharge. CSW spoke with patient and sister at bedside with verbal permission. Patient normally lives home alone. Patient expressed understanding of PT recommendation and is agreeable to SNF placement at time of discharge. Patient reports preference for [facility] as he has been there before. CSW discussed insurance authorization process and will provide Medicare SNF ratings list. Patient has received 3 COVID vaccines.  ? ?CSW spoke with [facility] and they will be able to accept patient on Monday pending insurance approval. CSW submitted clinicals, Ref# [id number]. Patient's sister stated she can help with paperwork Monday (she will be leaving town on Tuesday).  ? ?Patient reported that his keys are missing; he believes they were in his pants pocket (which are also missing). CSW appreciated RNCM contacting security and filing a safety zone to report it.  ? ?Skilled Nursing Rehab Facilities-   [email or web address]   Ratings out of 5 possible   ?Name Address  Phone # Quality Care Staffing Health Inspection Overall  ?[facility] [address], [place] 5 5 2 4   ?[facility]  [address], [place] [phone] 4 2 5 5   ?[facility] [place] 4 1 1 1   ?[facility] [address], [place] 2 2 4 4   ?[facility] [address], [place] 1 1 2 1   ?[place] 2 1 4 3   ?[facility] [address], [place] 5 2 2 3   ?[facility] [address], [place] 5 2 2 3   ?[facility] ([facility]) [place] [phone] 5 1 2 2   ?[facility] [address], [place] [phone] 4 1 1 1   ?[facility] [address], [facility] [phone] 4 1 2 1   ?[facility] [place] [phone] 4 1 1 1   ?        ?[place], [place]      ?[facility] [place] 4 2 3 3   ?[facility] [place] 3 1 5 4   ?[place], [place] [address], [place] [phone] 2 1 1 1   ?[facility] [address] [place] [phone] 2 2 3 3   ?        ?[address] (no [facility]) [address], [place] [phone] 4 5 5 5   ?[facility] (No [employer]) [address], [place] 4 1 4 3   ?[facility] (No UHC) [address], [place] [phone] 5 5 5 5   ?[facility] [address] [employer] [phone] 3 2 4 4   ?Dustin Flock [address], [place] 3 3 4 4   ?[place] [address], [place] 1 1 2 1   ?[facility] 2 [address] [place] [phone] 2 1 1 1   ?Ch[facility] Busing [place], [place] 5 2 4 5   ?[facility] [address] [place] 3 1 1 1   ?  [facility] [place] 2 1 2 1   ?        ?[facility] [address], [place] 1 1 1 1   ?[place] [address] [place]  [phone] 2 4 2 2   ?[facility] [place] [address], [place] [phone] 5 2 3 4   ?[place] [address], [place] 2 1 1 1   ?[place] (No Humana) [address], [place] 2 1 3 2   ?[facility] [address], [place] [phone] 3 1 1 1   ?        ?[facility] [place], [place] 5 4 5 5   ?[facility])  [address], [place] 2 2 3 3   ?[facility]) [place], [place] 3 2 4 4   ?[facility] [address] [place] 4 3 4 4   ?[address] [place], [place] 3 3 1 1   ?[facility]) [address] [place] [phone] 2 2 4 4   ? ? ? ?Expected Discharge Plan: [place] ?Barriers to Discharge: SNF Pending bed offer, Insurance Authorization ? ? ?Patient Goals and CMS Choice ?Patient states their goals for this hospitalization and ongoing recovery are:: Rehab ?CMS Medicare.gov Compare Post Acute Care list provided to:: Patient ?Choice offered to / list presented to : Patient, Sibling ? ?Expected Discharge Plan and Services ?Expected Discharge Plan: [place] ?In-house Referral: Clinical Social Work ?  ?Post Acute Care Choice: [place] ?Living arrangements for the past 2 months: [place] ?                ?  ?  ?  ?  ?  ?  ?  ?  ?  ?  ? ?Prior Living Arrangements/Services ?Living arrangements for the past 2 months: [place] ?Lives with:: Self ?Patient language and need for interpreter reviewed:: Yes ?Do you feel safe going back to the place where you live?: Yes      ?Need for Family Participation in Patient Care: Yes (Comment) ?Care giver support system in place?: Yes (comment) ?  ?Criminal Activity/Legal Involvement Pertinent to Current Situation/Hospitalization: No - Comment as needed ? ?Activities of Daily Living ?  ?  ? ?Permission Sought/Granted ?Permission sought to share information with : Facility Sport and exercise psychologist, Family Supports ?Permission granted to share information with : Yes, Verbal Permission Granted ? Share Information with NAME: [name] ? Permission granted to share info w AGENCY: SNFs ? Permission granted to share info w Relationship: Sister ? Permission granted to share info w Contact  Information: [phone] ? ?Emotional Assessment ?Appearance:: Appears stated age ?Attitude/Demeanor/Rapport:  (Concerned about finding his keys) ?Affect (typically observed): Accepting, Appropriate ?Orientation: : Oriented to Self, Oriented to Place, Oriented to  Time, Oriented to Situation ?Alcohol / Substance Use: Not Applicable ?Psych Involvement: No (comment) ? ?Admission diagnosis:  Rhabdomyolysis [M62.82] ?Candidiasis of scrotum [B37.49] ?AKI (acute kidney injury) ([place]) [N17.9] ?Skin excoriation [T14.8XXA] ?Fall, initial encounter [W19.XXXA] ?Traumatic rhabdomyolysis, initial encounter ([place]) [T79.6XXA] ?Acute metabolic encephalopathy [G81.15] ?Hematuria, unspecified type [R31.9] ?AMS (altered mental status) [R41.82] ?Patient Active Problem List  ? Diagnosis Date Noted  ? Acute metabolic encephalopathy 72/62/0355  ? Hypertension 11/11/2021  ? Benign prostatic hyperplasia 11/11/2021  ? Iron deficiency anemia 11/11/2021  ? Pure hypercholesterolemia 11/11/2021  ? fall with rhabdomyolysis  11/11/2021  ?  Hematuria 11/11/2021  ? AKI (acute kidney injury) ([place]) 11/11/2021  ? Diastolic dysfunction 27/02/2375  ? Ambulatory dysfunction 11/11/2021  ? Conjunctivitis 11/11/2021  ? Elevated AST (SGOT) 11/11/2021  ? Acute encephalopathy 11/11/2021  ? ?PCP:  [name], MD ?Pharmacy:   ?[place], [place] - [address] ?[address] ?[place] [address] ?Phone: [phone] Fax: [phone] ? ? ? ? ?Social Determinants of Health (SDOH) Interventions ?  ? ?Readmission Risk Interventions ?No flowsheet data found. ? ? ?

## 2021-11-14 NOTE — Progress Notes (Signed)
PROGRESS NOTE        PATIENT DETAILS Name: Mark Howe Age: 87 y.o. Sex: male Date of Birth: 01-05-1935 Admit Date: 11/11/2021 Admitting Physician Mark Mutton Mark Mans, MD Mark Howe, Mark Pall, MD  Brief Summary: Patient is a 87 y.o.  male with history of BPH, HTN, HLD-presented to the hospital after being found down covered in urine/feces (family had not heard from him for 1 week)-patient was found to have AKI with rhabdomyolysis and subsequently admitted to the hospitalist service.   Significant Hospital events: 3/1>> found down at house covered in feces/urine-confused-admit to hospitalist service-with AKI/rhabdomyolysis/encephalopathy  Significant imaging studies: 3/1>> MRI brain: No acute intracranial abnormality 3/1>> x-ray left hip: No acute abnormality seen.  Significant microbiology data: 3/1>> COVID/influenza PCR: Negative  Procedures: 3/2>> EEG: No seizures.  Consults:  None  Subjective: No major issues overnight-lying comfortably in bed-understands/agreeable to going to SNF.  Objective: Vitals: Blood pressure (!) 149/64, pulse 71, temperature 97.9 F (36.6 C), temperature source Oral, resp. rate (!) 21, height '5\' 9"'$  (1.753 m), weight 90.7 kg, SpO2 93 %.   Exam: Gen Exam:Alert awake-not in any distress HEENT:atraumatic, normocephalic Chest: B/L clear to auscultation anteriorly CVS:S1S2 regular Abdomen:soft non tender, non distended Extremities:no edema Neurology: Non focal Skin: no rash   Pertinent Labs/Radiology: CBC Latest Ref Rng & Units 11/13/2021 11/12/2021 11/11/2021  WBC 4.0 - 10.5 K/uL 7.2 8.9 10.8(H)  Hemoglobin 13.0 - 17.0 g/dL 10.2(L) 10.9(L) 12.3(L)  Hematocrit 39.0 - 52.0 % 31.0(L) 34.7(L) 36.8(L)  Platelets 150 - 400 K/uL 239 233 270    Lab Results  Component Value Date   NA 135 11/13/2021   K 4.1 11/13/2021   CL 106 11/13/2021   CO2 23 11/13/2021     Assessment/Plan: Rhabdomyolysis: Due to being down on the  floor for several days-CK levels have normalized after IVF.  AKI: Likely hemodynamically mediated and from rhabdomyolysis.  Renal function has normalized with supportive care.  Acute metabolic encephalopathy: Probably due to AKI/dehydration-much improved with supportive care.  Suspect he is not far from baseline.  MRI brain/EEG negative.  Maintain delirium precautions as patient is at some risk for hospital induced delirium.  Fall/found down at home-covered with urine/feces (last contact with family 2/21): Very poor historian-does not remember how he ended up on the floor for several days-he does remember falling.  MRI brain/EEG negative for seizures.  He does not think he syncopized.  Appreciate PT/OT eval-plans are for SNF.    Conjunctivitis: Already improving-continue erythromycin ophthalmic ointment x5 days total.  Transaminitis: Probably due to rhabdo-improving-follow periodically  BPH/hematuria: Followed by urologist Mark Howe in the outpatient setting-continue Flomax.  Social Issues: Patient has no spouse/children-his Sister Mark Howe is his next of kin.  Spoke to patient's sister at length on 3/2-patient has refused help in the past-has been resistant to going to SNF in the past.  He apparently is a hoarder-lives alone-and has poor living conditions.  His overall functional level has gradually declined over the past several months-patient has had some issues with anemia, hematuria and BPH.  Plans are for SNF on discharge.  Pressure Injury Pressure Injury 11/12/21 Coccyx Right;Left Stage 1 -  Intact skin with non-blanchable redness of a localized area usually over a bony prominence. (Active)  11/12/21 1828  Location: Coccyx  Location Orientation: Right;Left  Staging: Stage 1 -  Intact skin with non-blanchable redness  of a localized area usually over a bony prominence.  Wound Description (Comments):   Present on Admission: Yes     Pressure Injury 11/12/21 Vertebral column Upper;Right;Left  Stage 2 -  Partial thickness loss of dermis presenting as a shallow open injury with a red, pink wound bed without slough. (Active)  11/12/21 1829  Location: Vertebral column  Location Orientation: Upper;Right;Left  Staging: Stage 2 -  Partial thickness loss of dermis presenting as a shallow open injury with a red, pink wound bed without slough.  Wound Description (Comments):   Present on Admission: Yes        BMI: Estimated body mass index is 29.53 kg/m as calculated from the following:   Height as of this encounter: '5\' 9"'$  (1.753 m).   Weight as of this encounter: 90.7 kg.   Code status:   Code Status: Full Code   DVT Prophylaxis: enoxaparin (LOVENOX) injection 40 mg Start: 11/11/21 2200 Place TED hose Start: 11/11/21 1345   Family Communication: Mark Howe [phone] over the phone on 3/3   Disposition Plan: Status is: Observation The patient will require care spanning > 2 midnights and should be moved to inpatient because: Ongoing encephalopathy-not yet at baseline-await EEG-lives alone-not safe for discharge home-improving rhabdomyolysis-poor oral intake-needing IVF hydration.   Planned Discharge Destination:Skilled nursing facility   Diet: Diet Order             DIET SOFT Room service appropriate? Yes with Assist; Fluid consistency: Thin  Diet effective now                     Antimicrobial agents: Anti-infectives (From admission, onward)    None        MEDICATIONS: Scheduled Meds:  enoxaparin (LOVENOX) injection  40 mg Subcutaneous Q24H   erythromycin   Right Eye Q6H   tamsulosin  0.4 mg Oral QPC supper   Continuous Infusions: PRN Meds:.acetaminophen **OR** acetaminophen, naphazoline-glycerin   I have personally reviewed following labs and imaging studies  LABORATORY DATA: CBC: Recent Labs  Lab 11/11/21 1115 11/12/21 1045 11/13/21 0213  WBC 10.8* 8.9 7.2  NEUTROABS 8.4*  --   --   HGB 12.3* 10.9* 10.2*  HCT  36.8* 34.7* 31.0*  MCV 93.9 98.9 94.8  PLT 270 233 239     Basic Metabolic Panel: Recent Labs  Lab 11/11/21 1115 11/12/21 1045 11/13/21 0213  NA 137 140 135  K 4.5 4.2 4.1  CL 102 108 106  CO2 '22 24 23  '$ GLUCOSE 91 91 100*  BUN 38* 32* 25*  CREATININE 1.55* 1.24 1.21  CALCIUM 9.1 8.6* 8.3*  MG 2.3  --   --      GFR: Estimated Creatinine Clearance: 50.6 mL/min (by C-G formula based on SCr of 1.21 mg/dL).  Liver Function Tests: Recent Labs  Lab 11/11/21 1115 11/12/21 1045 11/13/21 0213  AST 110* 71* 58*  ALT 34 31 30  ALKPHOS 58 50 48  BILITOT 1.7* 0.9 0.8  PROT 7.0 6.0* 5.8*  ALBUMIN 2.8* 2.4* 2.4*    No results for input(s): LIPASE, AMYLASE in the last 168 hours. Recent Labs  Lab 11/11/21 1515  AMMONIA 55*     Coagulation Profile: No results for input(s): INR, PROTIME in the last 168 hours.  Cardiac Enzymes: Recent Labs  Lab 11/11/21 1115 11/12/21 1045 11/13/21 0213  CKTOTAL 1,915* 554* 381     BNP (last 3 results) No results for input(s): PROBNP in the last 8760 hours.  Lipid  Profile: No results for input(s): CHOL, HDL, LDLCALC, TRIG, CHOLHDL, LDLDIRECT in the last 72 hours.  Thyroid Function Tests: Recent Labs    11/11/21 1717  TSH 3.939     Anemia Panel: Recent Labs    11/11/21 1717  VITAMINB12 1,232*     Urine analysis:    Component Value Date/Time   COLORURINE YELLOW 11/11/2021 1232   APPEARANCEUR CLEAR 11/11/2021 1232   LABSPEC 1.017 11/11/2021 1232   PHURINE 5.0 11/11/2021 1232   GLUCOSEU NEGATIVE 11/11/2021 1232   HGBUR MODERATE (A) 11/11/2021 1232   BILIRUBINUR NEGATIVE 11/11/2021 1232   KETONESUR 20 (A) 11/11/2021 1232   PROTEINUR NEGATIVE 11/11/2021 1232   UROBILINOGEN 1.0 11/19/2011 1133   NITRITE NEGATIVE 11/11/2021 1232   LEUKOCYTESUR NEGATIVE 11/11/2021 1232    Sepsis Labs: Lactic Acid, Venous No results found for: LATICACIDVEN  MICROBIOLOGY: Recent Results (from the past 240 hour(s))  Resp Panel  by RT-PCR (Flu A&B, Covid) Nasopharyngeal Swab     Status: None   Collection Time: 11/11/21 12:00 PM   Specimen: Nasopharyngeal Swab; Nasopharyngeal(NP) swabs in vial transport medium  Result Value Ref Range Status   SARS Coronavirus 2 by RT PCR NEGATIVE NEGATIVE Final    Comment: (NOTE) SARS-CoV-2 target nucleic acids are NOT DETECTED.  The SARS-CoV-2 RNA is generally detectable in upper respiratory specimens during the acute phase of infection. The lowest concentration of SARS-CoV-2 viral copies this assay can detect is 138 copies/mL. A negative result does not preclude SARS-Cov-2 infection and should not be used as the sole basis for treatment or other patient management decisions. A negative result may occur with  improper specimen collection/handling, submission of specimen other than nasopharyngeal swab, presence of viral mutation(s) within the areas targeted by this assay, and inadequate number of viral copies(<138 copies/mL). A negative result must be combined with clinical observations, patient history, and epidemiological information. The expected result is Negative.  Fact Sheet for Patients:  EntrepreneurPulse.com.au  Fact Sheet for Healthcare Providers:  IncredibleEmployment.be  This test is no t yet approved or cleared by the [place] FDA and  has been authorized for detection and/or diagnosis of SARS-CoV-2 by FDA under an Emergency Use Authorization (EUA). This EUA will remain  in effect (meaning this test can be used) for the duration of the COVID-19 declaration under Section 564(b)(1) of the Act, 21 U.S.C.section 360bbb-3(b)(1), unless the authorization is terminated  or revoked sooner.       Influenza A by PCR NEGATIVE NEGATIVE Final   Influenza B by PCR NEGATIVE NEGATIVE Final    Comment: (NOTE) The Xpert Xpress SARS-CoV-2/FLU/RSV plus assay is intended as an aid in the diagnosis of influenza from Nasopharyngeal swab  specimens and should not be used as a sole basis for treatment. Nasal washings and aspirates are unacceptable for Xpert Xpress SARS-CoV-2/FLU/RSV testing.  Fact Sheet for Patients: EntrepreneurPulse.com.au  Fact Sheet for Healthcare Providers: IncredibleEmployment.be  This test is not yet approved or cleared by the [place] FDA and has been authorized for detection and/or diagnosis of SARS-CoV-2 by FDA under an Emergency Use Authorization (EUA). This EUA will remain in effect (meaning this test can be used) for the duration of the COVID-19 declaration under Section 564(b)(1) of the Act, 21 U.S.C. section 360bbb-3(b)(1), unless the authorization is terminated or revoked.  Performed at [facility], [place] [address] [place], [place] [address]     RADIOLOGY STUDIES/RESULTS: EEG adult  Result Date: 11/12/2021 Lora Havens, MD     11/12/2021  3:25 PM Patient Name: Mark Howe MRN: [id number] Epilepsy Attending: Lora Havens Referring Physician/Provider: Jonetta Osgood, MD Date: 11/12/2021 Duration: 27.03 mins Patient history: 87 year old male found down with altered mental status.  EEG to evaluate for seizure. Level of alertness: Awake, asleep AEDs during EEG study: None Technical aspects: This EEG study was done with scalp electrodes positioned according to the 10-20 International system of electrode placement. Electrical activity was acquired at a sampling rate of '500Hz'$  and reviewed with a high frequency filter of '70Hz'$  and a low frequency filter of '1Hz'$ . EEG data were recorded continuously and digitally stored. Description: The posterior dominant rhythm consists of 7.5 Hz activity of moderate voltage (25-35 uV) seen predominantly in posterior head regions, symmetric and reactive to eye opening and eye closing. Sleep was characterized by vertex waves, sleep spindles (12 to 14 Hz), maximal frontocentral region. EEG showed intermittent  generalized 5 to 6 Hz theta slowing. Hyperventilation and photic stimulation were not performed.   ABNORMALITY - Intermittent slow, generalized IMPRESSION: This study is suggestive of mild diffuse encephalopathy, nonspecific etiology. No seizures or epileptiform discharges were seen throughout the recording. Priyanka Barbra Sarks     LOS: 2 days   Oren Binet, MD  [facility]    To contact the attending provider between 7A-7P or the covering provider during after hours 7P-7A, please log into the web site [email or web address] and access using universal  password for that web site. If you do not have the password, please call the hospital operator.  11/14/2021, 1:35 PM

## 2021-11-14 NOTE — Evaluation (Signed)
Clinical/Bedside Swallow Evaluation ?Patient Details  ?Name: Mark Howe ?MRN: [id number] ?Date of Birth: 1935/04/02 ? ?Today's Date: 11/14/2021 ?Time: SLP Start Time (ACUTE ONLY): 1221 SLP Stop Time (ACUTE ONLY): 1240 ?SLP Time Calculation (min) (ACUTE ONLY): 19 min ? ?Past Medical History:  ?Past Medical History:  ?Diagnosis Date  ? Arthritis   ? L hip & R knee- OA  ? Cancer [facility])   ? basal cell CA- face  ? Shortness of breath   ? ?Past Surgical History:  ?Past Surgical History:  ?Procedure Laterality Date  ? APPENDECTOMY    ? [facility]- 2011  ? EYE SURGERY    ? cataracts removed, laser (bilateral)  ? NASAL SINUS SURGERY    ? [facility]- [facility]'s   ? TOTAL HIP ARTHROPLASTY  11/24/2011  ? Procedure: TOTAL HIP ARTHROPLASTY;  Surgeon: Ninetta Lights, MD;  Location: [place];  Service: Orthopedics;  Laterality: Left;  ? variose    ? varicose vein surgery  ? ?HPI:  ?Pt is an 87 y.o. male who presented to the ED after fall and confusion at home. CXR on admission negative. MRI brain negative. Pt found to have AKI with rhabdomyolysis, diagnosed with acute metabolic encephalopathy. PMH: BPH, HTN, IDA, HLD, DD CHF,  hx of hematuria.  ?  ?Assessment / Plan / Recommendation  ?Clinical Impression ? Pt presents with a suspected component of pharyngeal dysphagia. Oral dysphgaia appreciated due to pt edentulous status including reduced bolus cohesion with solids, prolonged mastication, and delayed oral transit. Pt states at baseline consuming softer solids and thin liquids. Pt with baseline wetness of voice (suspected to be saliva initially); cues for volitional throat clear and volitional swallow did not always clear wetness of voice. Wetness of voice was appreciable during all PO trials (including pre-PO trials). Delayed cough noted x1 with thins via straw use. Per RN pt had some post meal coughing after lying down immediately post snack. Given clear chest x ray and suspected chronic dyshpagia per pt report, recommend continue dysphagia 3  (mechanical soft) and thin liquids with meds in puree (crush larger) with safe swallowing precuations. Will follow for diet tolerance. If overt difficulty noted, future instrumental assessment may be indicated. ? ?SLP Visit Diagnosis: Dysphagia, oral phase (R13.11);Dysphagia, unspecified (R13.10) ?   ?Aspiration Risk ? Mild aspiration risk;Moderate aspiration risk  ?  ?Diet Recommendation  Dysphagia 3 (mechanical soft) thin liquids   ? ?Medication Administration: Whole meds with puree (crush larger PO meds)  ?  ?Other  Recommendations Oral Care Recommendations: Oral care BID   ? ?Recommendations for follow up therapy are one component of a multi-disciplinary discharge planning process, led by the attending physician.  Recommendations may be updated based on patient status, additional functional criteria and insurance authorization. ? ?Follow up Recommendations Skilled nursing-short term rehab (<3 hours/day)  ? ? ?  ?Assistance Recommended at Discharge Intermittent Supervision/Assistance  ?Functional Status Assessment Patient has had a recent decline in their functional status and demonstrates the ability to make significant improvements in function in a reasonable and predictable amount of time.  ?Frequency and Duration min 1 x/week  ?  ?  ?   ? ?Prognosis Prognosis for Safe Diet Advancement: Good ?Barriers to Reach Goals: Cognitive deficits;Time post onset  ? ?  ? ?Swallow Study   ?General HPI: Pt is an 87 y.o. male who presented to the ED after fall and confusion at home. CXR on admission negative. MRI brain negative. Pt found to have AKI with rhabdomyolysis, diagnosed with acute  metabolic encephalopathy. PMH: BPH, HTN, IDA, HLD, DD CHF,  hx of hematuria. ?Type of Study: Bedside Swallow Evaluation ?Previous Swallow Assessment: none on file ?Diet Prior to this Study: Dysphagia 3 (soft);Thin liquids ?Temperature Spikes Noted: No ?Respiratory Status: Room air ?History of Recent Intubation: No ?Behavior/Cognition:  Alert;Cooperative;Requires cueing (tangential) ?Oral Cavity Assessment: Within Functional Limits ?Oral Care Completed by SLP: No ?Oral Cavity - Dentition: Edentulous ?Vision: Functional for self-feeding ?Self-Feeding Abilities: Able to feed self ?Patient Positioning: Upright in bed ?Baseline Vocal Quality: Wet ?Volitional Cough: Weak ?Volitional Swallow: Able to elicit  ?  ?Oral/Motor/Sensory Function Overall Oral Motor/Sensory Function: Generalized oral weakness   ?Ice Chips Ice chips: Impaired ?Presentation: Spoon ?Oral Phase Functional Implications: Prolonged oral transit ?Pharyngeal Phase Impairments: Suspected delayed Swallow;Decreased hyoid-laryngeal movement;Wet Vocal Quality   ?Thin Liquid Thin Liquid: Impaired ?Presentation: Cup;Straw ?Pharyngeal  Phase Impairments: Suspected delayed Swallow;Multiple swallows;Decreased hyoid-laryngeal movement;Wet Vocal Quality;Cough - Delayed  ?  ?Nectar Thick Nectar Thick Liquid: Not tested   ?Honey Thick Honey Thick Liquid: Not tested   ?Puree Puree: Impaired ?Presentation: Spoon ?Pharyngeal Phase Impairments: Suspected delayed Swallow;Decreased hyoid-laryngeal movement;Wet Vocal Quality;Multiple swallows   ?Solid ? ? ?  Solid: Impaired ?Presentation: Self Fed ?Oral Phase Impairments: Reduced lingual movement/coordination;Impaired mastication ?Pharyngeal Phase Impairments: Suspected delayed Swallow;Multiple swallows;Decreased hyoid-laryngeal movement;Wet Vocal Quality  ? ?  ? ?[place], CCC-SLP ?Acute Rehabilitation Services  ? ?11/14/2021,12:56 PM ? ? ? ?

## 2021-11-14 NOTE — Consult Note (Signed)
WOC Nurse Consult Note: ?Reason for Consult: partial thickness skin loss at  left thoracic area, Stage 1 PI to coccyx and healing full thickness lesion in the center of a previous L hip incision. ?Wound type:Pressure, moisture ?Pressure Injury POA: Yes ?Measurement:n See also photos taken and uploaded to the EHR. ?Left thoracic:  1cm x 4cm x 0.2cm Red, moist, <20% yellow fibrinous tissue ?Left hip: 1cm x 1cm x 0.2cm dry, red ?Wound bed:As described above ?Drainage (amount, consistency, odor) Scant to small serous ?Periwound: intact and as described above ?Dressing procedure/placement/frequency: I have provided bilateral pressure redistribution heel boots and a sacral foam dressing for PI prevention. Topical care to hip will be with a silver hydrofiber topped with foam, to the left thoracic will be with xeroform gauze topped with silicone foam. Both can be changed once daily. Turning and repositioning per house protocol is in place.  ? ?I was assisted in the development of this POC by the patient's Bedside RN, O. Jimmye Norman, whose assistance and expertise was appreciated.  ? ? ?[place] nursing team will not follow, but will remain available to this patient, the nursing and medical teams.  Please re-consult if needed. ?Thanks, ?Maudie Flakes, MSN, RN, [place], [place], CWON-AP, [place]  ?Pager# [phone]  ? ?  ?

## 2021-11-15 LAB — RESP PANEL BY RT-PCR (FLU A&B, COVID) ARPGX2
Influenza A by PCR: NEGATIVE
Influenza B by PCR: NEGATIVE
SARS Coronavirus 2 by RT PCR: NEGATIVE

## 2021-11-15 MED ORDER — NYSTATIN 100000 UNIT/GM EX POWD
Freq: Three times a day (TID) | CUTANEOUS | Status: DC
  Filled 2021-11-15: qty 15

## 2021-11-15 MED ORDER — WHITE PETROLATUM EX OINT
TOPICAL_OINTMENT | CUTANEOUS | Status: DC | PRN
  Filled 2021-11-15: qty 28.35

## 2021-11-15 NOTE — Progress Notes (Signed)
PROGRESS NOTE        PATIENT DETAILS Name: Mark Howe Age: 87 y.o. Sex: male Date of Birth: 1934-12-09 Admit Date: 11/11/2021 Admitting Physician Evalee Mutton Kristeen Mans, MD WVP:[id number], [name], MD  Brief Summary: Patient is a 87 y.o.  male with history of BPH, HTN, HLD-presented to the hospital after being found down covered in urine/feces (family had not heard from him for 1 week)-patient was found to have AKI with rhabdomyolysis and subsequently admitted to the hospitalist service.   Significant Hospital events: 3/1>> found down at house covered in feces/urine-confused-admit to hospitalist service-with AKI/rhabdomyolysis/encephalopathy  Significant imaging studies: 3/1>> MRI brain: No acute intracranial abnormality 3/1>> x-ray left hip: No acute abnormality seen.  Significant microbiology data: 3/1>> COVID/influenza PCR: Negative  Procedures: 3/2>> EEG: No seizures.  Consults:  None  Subjective: Lying comfortably in bed-no chest pain or shortness of breath.  Complains of some pain in his left hip that has been ongoing for the past several weeks.  Objective: Vitals: Blood pressure 126/86, pulse 64, temperature 98 F (36.7 C), temperature source Oral, resp. rate 20, height '5\' 9"'$  (1.753 m), weight 90.7 kg, SpO2 93 %.   Exam: Gen Exam:Alert awake-not in any distress HEENT:atraumatic, normocephalic Chest: B/L clear to auscultation anteriorly CVS:S1S2 regular Abdomen:soft non tender, non distended Extremities:no edema Neurology: Non focal Skin: no rash   Pertinent Labs/Radiology: CBC Latest Ref Rng & Units 11/13/2021 11/12/2021 11/11/2021  WBC 4.0 - 10.5 K/uL 7.2 8.9 10.8(H)  Hemoglobin 13.0 - 17.0 g/dL 10.2(L) 10.9(L) 12.3(L)  Hematocrit 39.0 - 52.0 % 31.0(L) 34.7(L) 36.8(L)  Platelets 150 - 400 K/uL 239 233 270    Lab Results  Component Value Date   NA 135 11/13/2021   K 4.1 11/13/2021   CL 106 11/13/2021   CO2 23 11/13/2021      Assessment/Plan: Rhabdomyolysis: Due to being down on the floor for several days-CK levels have normalized after IVF.  AKI: Likely hemodynamically mediated and from rhabdomyolysis.  Renal function has normalized with supportive care.  Acute metabolic encephalopathy: Probably due to AKI/dehydration-much improved with supportive care.  Suspect he is not far from baseline.  MRI brain/EEG negative.  Maintain delirium precautions as patient is at some risk for hospital induced delirium.  Fall/found down at home-covered with urine/feces (last contact with family 2/21): Very poor historian-does not remember how he ended up on the floor for several days-he does remember falling.  MRI brain/EEG negative for seizures.  He does not think he syncopized.  Appreciate PT/OT eval-plans are for SNF.    Conjunctivitis: Already improving-continue erythromycin ophthalmic ointment x5 days total.  Transaminitis: Probably due to rhabdo-improving-follow periodically  BPH/hematuria: Followed by urologist Dr.Wrenn in the outpatient setting-continue Flomax.  Social Issues: Patient has no spouse/children-his Sister [name] is his next of kin.  Spoke to patient's sister at length on 3/2-patient has refused help in the past-has been resistant to going to SNF in the past.  He apparently is a hoarder-lives alone-and has poor living conditions.  His overall functional level has gradually declined over the past several months-patient has had some issues with anemia, hematuria and BPH.  Plans are for SNF on discharge.  Pressure Injury Pressure Injury 11/12/21 Buttocks Right Stage 2 -  Partial thickness loss of dermis presenting as a shallow open injury with a red, pink wound bed without slough. (Active)  11/12/21 1828  Location: Buttocks  Location Orientation: Right  Staging: Stage 2 -  Partial thickness loss of dermis presenting as a shallow open injury with a red, pink wound bed without slough.  Wound Description (Comments):    Present on Admission: Yes     Pressure Injury 11/12/21 Vertebral column Upper;Right;Left Stage 2 -  Partial thickness loss of dermis presenting as a shallow open injury with a red, pink wound bed without slough. (Active)  11/12/21 1829  Location: Vertebral column  Location Orientation: Upper;Right;Left  Staging: Stage 2 -  Partial thickness loss of dermis presenting as a shallow open injury with a red, pink wound bed without slough.  Wound Description (Comments):   Present on Admission: Yes        BMI: Estimated body mass index is 29.53 kg/m as calculated from the following:   Height as of this encounter: '5\' 9"'$  (1.753 m).   Weight as of this encounter: 90.7 kg.   Code status:   Code Status: Full Code   DVT Prophylaxis: enoxaparin (LOVENOX) injection 40 mg Start: 11/11/21 2200 Place TED hose Start: 11/11/21 1345   Family Communication: [name] [phone] over the phone on 3/3   Disposition Plan: Awaiting SNF bed-likely on 3/6 per social work.   Planned Discharge Destination:Skilled nursing facility   Diet: Diet Order             DIET SOFT Room service appropriate? No; Fluid consistency: Thin  Diet effective now                     Antimicrobial agents: Anti-infectives (From admission, onward)    None        MEDICATIONS: Scheduled Meds:  enoxaparin (LOVENOX) injection  40 mg Subcutaneous Q24H   erythromycin   Right Eye Q6H   nystatin   Topical TID   tamsulosin  0.4 mg Oral QPC supper   Continuous Infusions: PRN Meds:.acetaminophen **OR** acetaminophen, naphazoline-glycerin, white petrolatum   I have personally reviewed following labs and imaging studies  LABORATORY DATA: CBC: Recent Labs  Lab 11/11/21 1115 11/12/21 1045 11/13/21 0213  WBC 10.8* 8.9 7.2  NEUTROABS 8.4*  --   --   HGB 12.3* 10.9* 10.2*  HCT 36.8* 34.7* 31.0*  MCV 93.9 98.9 94.8  PLT 270 233 239     Basic Metabolic Panel: Recent Labs  Lab  11/11/21 1115 11/12/21 1045 11/13/21 0213  NA 137 140 135  K 4.5 4.2 4.1  CL 102 108 106  CO2 '22 24 23  '$ GLUCOSE 91 91 100*  BUN 38* 32* 25*  CREATININE 1.55* 1.24 1.21  CALCIUM 9.1 8.6* 8.3*  MG 2.3  --   --      GFR: Estimated Creatinine Clearance: 50.6 mL/min (by C-G formula based on SCr of 1.21 mg/dL).  Liver Function Tests: Recent Labs  Lab 11/11/21 1115 11/12/21 1045 11/13/21 0213  AST 110* 71* 58*  ALT 34 31 30  ALKPHOS 58 50 48  BILITOT 1.7* 0.9 0.8  PROT 7.0 6.0* 5.8*  ALBUMIN 2.8* 2.4* 2.4*    No results for input(s): LIPASE, AMYLASE in the last 168 hours. Recent Labs  Lab 11/11/21 1515  AMMONIA 55*     Coagulation Profile: No results for input(s): INR, PROTIME in the last 168 hours.  Cardiac Enzymes: Recent Labs  Lab 11/11/21 1115 11/12/21 1045 11/13/21 0213  CKTOTAL 1,915* 554* 381     BNP (last 3 results) No results for input(s): PROBNP in the  last 8760 hours.  Lipid Profile: No results for input(s): CHOL, HDL, LDLCALC, TRIG, CHOLHDL, LDLDIRECT in the last 72 hours.  Thyroid Function Tests: No results for input(s): TSH, T4TOTAL, FREET4, T3FREE, THYROIDAB in the last 72 hours.   Anemia Panel: No results for input(s): VITAMINB12, FOLATE, FERRITIN, TIBC, IRON, RETICCTPCT in the last 72 hours.   Urine analysis:    Component Value Date/Time   COLORURINE YELLOW 11/11/2021 1232   APPEARANCEUR CLEAR 11/11/2021 1232   LABSPEC 1.017 11/11/2021 1232   PHURINE 5.0 11/11/2021 1232   GLUCOSEU NEGATIVE 11/11/2021 1232   HGBUR MODERATE (A) 11/11/2021 1232   BILIRUBINUR NEGATIVE 11/11/2021 1232   KETONESUR 20 (A) 11/11/2021 1232   PROTEINUR NEGATIVE 11/11/2021 1232   UROBILINOGEN 1.0 11/19/2011 1133   NITRITE NEGATIVE 11/11/2021 1232   LEUKOCYTESUR NEGATIVE 11/11/2021 1232    Sepsis Labs: Lactic Acid, Venous No results found for: LATICACIDVEN  MICROBIOLOGY: Recent Results (from the past 240 hour(s))  Resp Panel by RT-PCR (Flu A&B,  Covid) Nasopharyngeal Swab     Status: None   Collection Time: 11/11/21 12:00 PM   Specimen: Nasopharyngeal Swab; Nasopharyngeal(NP) swabs in vial transport medium  Result Value Ref Range Status   SARS Coronavirus 2 by RT PCR NEGATIVE NEGATIVE Final    Comment: (NOTE) SARS-CoV-2 target nucleic acids are NOT DETECTED.  The SARS-CoV-2 RNA is generally detectable in upper respiratory specimens during the acute phase of infection. The lowest concentration of SARS-CoV-2 viral copies this assay can detect is 138 copies/mL. A negative result does not preclude SARS-Cov-2 infection and should not be used as the sole basis for treatment or other patient management decisions. A negative result may occur with  improper specimen collection/handling, submission of specimen other than nasopharyngeal swab, presence of viral mutation(s) within the areas targeted by this assay, and inadequate number of viral copies(<138 copies/mL). A negative result must be combined with clinical observations, patient history, and epidemiological information. The expected result is Negative.  Fact Sheet for Patients:  EntrepreneurPulse.com.au  Fact Sheet for Healthcare Providers:  IncredibleEmployment.be  This test is no t yet approved or cleared by the [place] FDA and  has been authorized for detection and/or diagnosis of SARS-CoV-2 by FDA under an Emergency Use Authorization (EUA). This EUA will remain  in effect (meaning this test can be used) for the duration of the COVID-19 declaration under Section 564(b)(1) of the Act, 21 U.S.C.section 360bbb-3(b)(1), unless the authorization is terminated  or revoked sooner.       Influenza A by PCR NEGATIVE NEGATIVE Final   Influenza B by PCR NEGATIVE NEGATIVE Final    Comment: (NOTE) The Xpert Xpress SARS-CoV-2/FLU/RSV plus assay is intended as an aid in the diagnosis of influenza from Nasopharyngeal swab specimens and should  not be used as a sole basis for treatment. Nasal washings and aspirates are unacceptable for Xpert Xpress SARS-CoV-2/FLU/RSV testing.  Fact Sheet for Patients: EntrepreneurPulse.com.au  Fact Sheet for Healthcare Providers: IncredibleEmployment.be  This test is not yet approved or cleared by the [place] FDA and has been authorized for detection and/or diagnosis of SARS-CoV-2 by FDA under an Emergency Use Authorization (EUA). This EUA will remain in effect (meaning this test can be used) for the duration of the COVID-19 declaration under Section 564(b)(1) of the Act, 21 U.S.C. section 360bbb-3(b)(1), unless the authorization is terminated or revoked.  Performed at [facility], [place] [address] [place], [place] [address]     RADIOLOGY STUDIES/RESULTS: No results found.  LOS: 3 days   Oren Binet, MD  [facility]    To contact the attending provider between 7A-7P or the covering provider during after hours 7P-7A, please log into the web site [email or web address] and access using universal [place] password for that web site. If you do not have the password, please call the hospital operator.  11/15/2021, 11:52 AM

## 2021-11-15 NOTE — Plan of Care (Signed)

## 2021-11-16 MED ORDER — ENSURE ENLIVE PO LIQD
237.0000 mL | Freq: Two times a day (BID) | ORAL | Status: DC
  Administered 2021-11-16 – 2021-11-17 (×3): 237 mL via ORAL

## 2021-11-16 NOTE — TOC Progression Note (Addendum)
Transition of Care (TOC) - Progression Note  ? ? ?Patient Details  ?Name: Mark Howe ?MRN: [id number] ?Date of Birth: 1934/10/30 ? ?Transition of Care (TOC) CM/SW Contact  ?Benard Halsted, LCSW ?Phone Number: ?11/16/2021, 8:47 AM ? ?Clinical Narrative:    ?8:47am-Insurance authorization for [facility] still pending. CSW appreciates PT working with patient today in case insurance requests an updated note.  ? ?4:23pm-CSW received call from insurance stating they will approve patient soon. Patient's sister has completed paperwork at [facility] and dropped off clothes for patient. CSW alerted her that CSW will call [name] for patient in the morning. [facility] aware of plan.  ? ? ?Expected Discharge Plan: [place] ?Barriers to Discharge: SNF Pending bed offer, Insurance Authorization ? ?Expected Discharge Plan and Services ?Expected Discharge Plan: [place] ?In-house Referral: Clinical Social Work ?  ?Post Acute Care Choice: [place] ?Living arrangements for the past 2 months: [place] ?                ?  ?  ?  ?  ?  ?  ?  ?  ?  ?  ? ? ?Social Determinants of Health (SDOH) Interventions ?  ? ?Readmission Risk Interventions ?No flowsheet data found. ? ?

## 2021-11-16 NOTE — Plan of Care (Signed)
  Problem: Clinical Measurements: Goal: Respiratory complications will improve Outcome: Progressing   Problem: Activity: Goal: Risk for activity intolerance will decrease Outcome: Progressing   Problem: Nutrition: Goal: Adequate nutrition will be maintained Outcome: Progressing   Problem: Elimination: Goal: Will not experience complications related to bowel motility Outcome: Progressing Goal: Will not experience complications related to urinary retention Outcome: Progressing   Problem: Skin Integrity: Goal: Risk for impaired skin integrity will decrease Outcome: Progressing   

## 2021-11-16 NOTE — Progress Notes (Signed)
PROGRESS NOTE        PATIENT DETAILS Name: Mark Howe Age: 87 y.o. Sex: male Date of Birth: Feb 01, 1935 Admit Date: 11/11/2021 Admitting Physician Mark Mutton Mark Howe Mark Howe, Mark Pall, Howe  Brief Summary: Patient is a 87 y.o.  male with history of BPH, HTN, HLD-presented to the hospital after being found down covered in urine/feces (family had not heard from him for 1 week)-patient was found to have AKI with rhabdomyolysis and subsequently admitted to the hospitalist service.   Significant Hospital events: 3/1>> found down at house covered in feces/urine-confused-admit to hospitalist service-with AKI/rhabdomyolysis/encephalopathy  Significant imaging studies: 3/1>> MRI brain: No acute intracranial abnormality 3/1>> x-ray left hip: No acute abnormality seen.  Significant microbiology data: 3/1>> COVID/influenza PCR: Negative  Procedures: 3/2>> EEG: No seizures.  Consults:  None  Subjective: Awake/alert-following commands-has multiple complaints including loss of car keys etc.  Awaiting SNF placement.  Complains of pain in his lower back/left hip (ongoing for several months)  Objective: Vitals: Blood pressure (!) 185/77, pulse 64, temperature 97.9 F (36.6 C), temperature source Oral, resp. rate (!) 21, height '5\' 9"'$  (1.753 m), weight 90.7 kg, SpO2 91 %.   Exam: Gen Exam:Alert awake-not in any distress HEENT:atraumatic, normocephalic Chest: B/L clear to auscultation anteriorly CVS:S1S2 regular Abdomen:soft non tender, non distended Extremities:no edema Neurology: Non focal Skin: no rash   Pertinent Labs/Radiology: CBC Latest Ref Rng & Units 11/13/2021 11/12/2021 11/11/2021  WBC 4.0 - 10.5 K/uL 7.2 8.9 10.8(H)  Hemoglobin 13.0 - 17.0 g/dL 10.2(L) 10.9(L) 12.3(L)  Hematocrit 39.0 - 52.0 % 31.0(L) 34.7(L) 36.8(L)  Platelets 150 - 400 K/uL 239 233 270    Lab Results  Component Value Date   NA 135 11/13/2021   K 4.1 11/13/2021   CL 106  11/13/2021   CO2 23 11/13/2021     Assessment/Plan: Rhabdomyolysis: Due to being down on the floor for several days-CK levels have normalized after IVF.  AKI: Likely hemodynamically mediated and from rhabdomyolysis.  Renal function has normalized with supportive care.  Acute metabolic encephalopathy: Probably due to AKI/dehydration-much improved with supportive care.  Suspect he is not far from baseline.  MRI brain/EEG negative.  Maintain delirium precautions as patient is at some risk for hospital induced delirium.  Fall/found down at home-covered with urine/feces (last contact with family 2/21): Very poor historian-does not remember how he ended up on the floor for several days-he does remember falling.  MRI brain/EEG negative for seizures.  He does not think he syncopized.  Appreciate PT/OT eval-plans are for SNF.    Conjunctivitis: Already improving-continue erythromycin ophthalmic ointment x5 days total.  Transaminitis: Probably due to rhabdo-improving-follow periodically  BPH/hematuria: Followed by urologist Mark Howe in the outpatient setting-continue Flomax.  Debility/deconditioning: Previously living by himself-has had significant decline in his functional status over the past several months-likely worsened by his acute illness.  Evaluated by PT/OT-recommendations are for SNF.  Social Issues: Patient has no spouse/children-his Sister Mark Howe is his next of kin.  Spoke to patient's sister at length on 3/2-patient has refused help in the past-has been resistant to going to SNF in the past.  He apparently is a hoarder-lives alone-and has poor living conditions.  His overall functional level has gradually declined over the past several months-patient has had some issues with anemia, hematuria and BPH.  Plans are for SNF on discharge.  Pressure  Injury Pressure Injury 11/12/21 Buttocks Right Stage 2 -  Partial thickness loss of dermis presenting as a shallow open injury with a red, pink wound  bed without slough. (Active)  11/12/21 1828  Location: Buttocks  Location Orientation: Right  Staging: Stage 2 -  Partial thickness loss of dermis presenting as a shallow open injury with a red, pink wound bed without slough.  Wound Description (Comments):   Present on Admission: Yes     Pressure Injury 11/12/21 Vertebral column Upper;Right;Left Stage 2 -  Partial thickness loss of dermis presenting as a shallow open injury with a red, pink wound bed without slough. (Active)  11/12/21 1829  Location: Vertebral column  Location Orientation: Upper;Right;Left  Staging: Stage 2 -  Partial thickness loss of dermis presenting as a shallow open injury with a red, pink wound bed without slough.  Wound Description (Comments):   Present on Admission: Yes        BMI: Estimated body mass index is 29.53 kg/m as calculated from the following:   Height as of this encounter: '5\' 9"'$  (1.753 m).   Weight as of this encounter: 90.7 kg.   Code status:   Code Status: Full Code   DVT Prophylaxis: enoxaparin (LOVENOX) injection 40 mg Start: 11/11/21 2200 Place TED hose Start: 11/11/21 1345   Family Communication: Mark Howe [phone] over the phone on 3/5   Disposition Plan: Awaiting SNF bed-likely on 3/6 per social work.   Planned Discharge Destination:Skilled nursing facility   Diet: Diet Order             DIET SOFT Room service appropriate? No; Fluid consistency: Thin  Diet effective now                     Antimicrobial agents: Anti-infectives (From admission, onward)    None        MEDICATIONS: Scheduled Meds:  enoxaparin (LOVENOX) injection  40 mg Subcutaneous Q24H   erythromycin   Right Eye Q6H   nystatin   Topical TID   tamsulosin  0.4 mg Oral QPC supper   Continuous Infusions: PRN Meds:.acetaminophen **OR** acetaminophen, naphazoline-glycerin, white petrolatum   I have personally reviewed following labs and imaging studies  LABORATORY  DATA: CBC: Recent Labs  Lab 11/11/21 1115 11/12/21 1045 11/13/21 0213  WBC 10.8* 8.9 7.2  NEUTROABS 8.4*  --   --   HGB 12.3* 10.9* 10.2*  HCT 36.8* 34.7* 31.0*  MCV 93.9 98.9 94.8  PLT 270 233 638    Basic Metabolic Panel: Recent Labs  Lab 11/11/21 1115 11/12/21 1045 11/13/21 0213  NA 137 140 135  K 4.5 4.2 4.1  CL 102 108 106  CO2 '22 24 23  '$ GLUCOSE 91 91 100*  BUN 38* 32* 25*  CREATININE 1.55* 1.24 1.21  CALCIUM 9.1 8.6* 8.3*  MG 2.3  --   --     GFR: Estimated Creatinine Clearance: 50.6 mL/min (by C-G formula based on SCr of 1.21 mg/dL).  Liver Function Tests: Recent Labs  Lab 11/11/21 1115 11/12/21 1045 11/13/21 0213  AST 110* 71* 58*  ALT 34 31 30  ALKPHOS 58 50 48  BILITOT 1.7* 0.9 0.8  PROT 7.0 6.0* 5.8*  ALBUMIN 2.8* 2.4* 2.4*   No results for input(s): LIPASE, AMYLASE in the last 168 hours. Recent Labs  Lab 11/11/21 1515  AMMONIA 55*    Coagulation Profile: No results for input(s): INR, PROTIME in the last 168 hours.  Cardiac Enzymes: Recent Labs  Lab 11/11/21 1115 11/12/21 1045 11/13/21 0213  CKTOTAL 1,915* 554* 381    BNP (last 3 results) No results for input(s): PROBNP in the last 8760 hours.  Lipid Profile: No results for input(s): CHOL, HDL, LDLCALC, TRIG, CHOLHDL, LDLDIRECT in the last 72 hours.  Thyroid Function Tests: No results for input(s): TSH, T4TOTAL, FREET4, T3FREE, THYROIDAB in the last 72 hours.   Anemia Panel: No results for input(s): VITAMINB12, FOLATE, FERRITIN, TIBC, IRON, RETICCTPCT in the last 72 hours.   Urine analysis:    Component Value Date/Time   COLORURINE YELLOW 11/11/2021 1232   APPEARANCEUR CLEAR 11/11/2021 1232   LABSPEC 1.017 11/11/2021 1232   PHURINE 5.0 11/11/2021 1232   GLUCOSEU NEGATIVE 11/11/2021 1232   HGBUR MODERATE (A) 11/11/2021 1232   BILIRUBINUR NEGATIVE 11/11/2021 1232   KETONESUR 20 (A) 11/11/2021 1232   PROTEINUR NEGATIVE 11/11/2021 1232   UROBILINOGEN 1.0 11/19/2011  1133   NITRITE NEGATIVE 11/11/2021 1232   LEUKOCYTESUR NEGATIVE 11/11/2021 1232    Sepsis Labs: Lactic Acid, Venous No results found for: LATICACIDVEN  MICROBIOLOGY: Recent Results (from the past 240 hour(s))  Resp Panel by RT-PCR (Flu A&B, Covid) Nasopharyngeal Swab     Status: None   Collection Time: 11/11/21 12:00 PM   Specimen: Nasopharyngeal Swab; Nasopharyngeal(NP) swabs in vial transport medium  Result Value Ref Range Status   SARS Coronavirus 2 by RT PCR NEGATIVE NEGATIVE Final    Comment: (NOTE) SARS-CoV-2 target nucleic acids are NOT DETECTED.  The SARS-CoV-2 RNA is generally detectable in upper respiratory specimens during the acute phase of infection. The lowest concentration of SARS-CoV-2 viral copies this assay can detect is 138 copies/mL. A negative result does not preclude SARS-Cov-2 infection and should not be used as the sole basis for treatment or other patient management decisions. A negative result may occur with  improper specimen collection/handling, submission of specimen other than nasopharyngeal swab, presence of viral mutation(s) within the areas targeted by this assay, and inadequate number of viral copies(<138 copies/mL). A negative result must be combined with clinical observations, patient history, and epidemiological information. The expected result is Negative.  Fact Sheet for Patients:  EntrepreneurPulse.com.au  Fact Sheet for Healthcare Providers:  IncredibleEmployment.be  This test is no t yet approved or cleared by the [place] FDA and  has been authorized for detection and/or diagnosis of SARS-CoV-2 by FDA under an Emergency Use Authorization (EUA). This EUA will remain  in effect (meaning this test can be used) for the duration of the COVID-19 declaration under Section 564(b)(1) of the Act, 21 U.S.C.section 360bbb-3(b)(1), unless the authorization is terminated  or revoked sooner.        Influenza A by PCR NEGATIVE NEGATIVE Final   Influenza B by PCR NEGATIVE NEGATIVE Final    Comment: (NOTE) The Xpert Xpress SARS-CoV-2/FLU/RSV plus assay is intended as an aid in the diagnosis of influenza from Nasopharyngeal swab specimens and should not be used as a sole basis for treatment. Nasal washings and aspirates are unacceptable for Xpert Xpress SARS-CoV-2/FLU/RSV testing.  Fact Sheet for Patients: EntrepreneurPulse.com.au  Fact Sheet for Healthcare Providers: IncredibleEmployment.be  This test is not yet approved or cleared by the [place] FDA and has been authorized for detection and/or diagnosis of SARS-CoV-2 by FDA under an Emergency Use Authorization (EUA). This EUA will remain in effect (meaning this test can be used) for the duration of the COVID-19 declaration under Section 564(b)(1) of the Act, 21 U.S.C. section 360bbb-3(b)(1), unless the authorization is terminated or  revoked.  Performed at [facility], [place] [address], [place], [place] [address]   Resp Panel by RT-PCR (Flu A&B, Covid) Nasopharyngeal Swab     Status: None   Collection Time: 11/15/21  3:53 PM   Specimen: Nasopharyngeal Swab; Nasopharyngeal(NP) swabs in vial transport medium  Result Value Ref Range Status   SARS Coronavirus 2 by RT PCR NEGATIVE NEGATIVE Final    Comment: (NOTE) SARS-CoV-2 target nucleic acids are NOT DETECTED.  The SARS-CoV-2 RNA is generally detectable in upper respiratory specimens during the acute phase of infection. The lowest concentration of SARS-CoV-2 viral copies this assay can detect is 138 copies/mL. A negative result does not preclude SARS-Cov-2 infection and should not be used as the sole basis for treatment or other patient management decisions. A negative result may occur with  improper specimen collection/handling, submission of specimen other than nasopharyngeal swab, presence of viral mutation(s) within  the areas targeted by this assay, and inadequate number of viral copies(<138 copies/mL). A negative result must be combined with clinical observations, patient history, and epidemiological information. The expected result is Negative.  Fact Sheet for Patients:  EntrepreneurPulse.com.au  Fact Sheet for Healthcare Providers:  IncredibleEmployment.be  This test is no t yet approved or cleared by the [place] FDA and  has been authorized for detection and/or diagnosis of SARS-CoV-2 by FDA under an Emergency Use Authorization (EUA). This EUA will remain  in effect (meaning this test can be used) for the duration of the COVID-19 declaration under Section 564(b)(1) of the Act, 21 U.S.C.section 360bbb-3(b)(1), unless the authorization is terminated  or revoked sooner.       Influenza A by PCR NEGATIVE NEGATIVE Final   Influenza B by PCR NEGATIVE NEGATIVE Final    Comment: (NOTE) The Xpert Xpress SARS-CoV-2/FLU/RSV plus assay is intended as an aid in the diagnosis of influenza from Nasopharyngeal swab specimens and should not be used as a sole basis for treatment. Nasal washings and aspirates are unacceptable for Xpert Xpress SARS-CoV-2/FLU/RSV testing.  Fact Sheet for Patients: EntrepreneurPulse.com.au  Fact Sheet for Healthcare Providers: IncredibleEmployment.be  This test is not yet approved or cleared by the [place] FDA and has been authorized for detection and/or diagnosis of SARS-CoV-2 by FDA under an Emergency Use Authorization (EUA). This EUA will remain in effect (meaning this test can be used) for the duration of the COVID-19 declaration under Section 564(b)(1) of the Act, 21 U.S.C. section 360bbb-3(b)(1), unless the authorization is terminated or revoked.  Performed at [facility], [place] [address] [place], [place] [address]     RADIOLOGY STUDIES/RESULTS: No results found.    LOS: 4 days   Oren Binet, Howe  [facility]    To contact the attending provider between 7A-7P or the covering provider during after hours 7P-7A, please log into the web site [email or web address] and access using universal [place] password for that web site. If you do not have the password, please call the hospital operator.  11/16/2021, 11:11 AM

## 2021-11-16 NOTE — Progress Notes (Signed)
Speech Language Pathology Treatment: Dysphagia  ?Patient Details ?Name: Mark Howe ?MRN: [id number] ?DOB: [birth year] ?Today's Date: 11/16/2021 ?Time: 1200-1230 ?SLP Time Calculation (min) (ACUTE ONLY): 30 min ? ?Assessment / Plan / Recommendation ?Clinical Impression ? Pt seen at bedside to assess diet tolerance following BSE completed 11/14/21. Pt is edentulous, and indicates he does not like to wear his dentures. Pt reports having difficulty chewing Dysphagia 3 solids. Pt was provided with Dys2 solids and thin liquids, and indicated it was much easier to manage. No overt s/s aspiration observed following either consistency. Will downgrade diet to dys2 (finely chopped) with thin liquids to maximize effective mastication. Pt appreciative of the change. RN informed.  ST will follow up x1 to insure tolerance of dys2 diet.  ?  ?HPI HPI: Pt is an 87 y.o. male who presented to the ED after fall and confusion at home. CXR on admission negative. MRI brain negative. Pt found to have AKI with rhabdomyolysis, diagnosed with acute metabolic encephalopathy. PMH: BPH, HTN, IDA, HLD, DD CHF,  hx of hematuria. ?  ?   ?SLP Plan ? Continue with current plan of care ? ?  ?  ?Recommendations for follow up therapy are one component of a multi-disciplinary discharge planning process, led by the attending physician.  Recommendations may be updated based on patient status, additional functional criteria and insurance authorization. ?  ? ?Recommendations  ?Diet recommendations: Dysphagia 2 (fine chop);Thin liquid ?Liquids provided via: Cup;Straw ?Medication Administration: Whole meds with puree ?Supervision: Patient able to self feed ?Compensations: Minimize environmental distractions;Slow rate;Small sips/bites ?Postural Changes and/or Swallow Maneuvers: Seated upright 90 degrees;Upright 30-60 min after meal  ?   ?    ?   ? ? ? ? Oral Care Recommendations: Oral care BID ?Follow Up Recommendations: Skilled nursing-short term rehab (<3  hours/day) ?Assistance recommended at discharge: Intermittent Supervision/Assistance ?SLP Visit Diagnosis: Dysphagia, oral phase (R13.11);Dysphagia, unspecified (R13.10) ?Plan: Continue with current plan of care ? ? ? ? ?  ?  ?Mark Howe, MSP, CCC-SLP ?Speech Language Pathologist ?Office: [phone] ? ?Shonna Chock ? ?11/16/2021, 12:37 PM ?

## 2021-11-16 NOTE — Plan of Care (Signed)

## 2021-11-16 NOTE — Care Management Important Message (Signed)
Important Message ? ?Patient Details  ?Name: Mark Howe ?MRN: [id number] ?Date of Birth: 04-Aug-1935 ? ? ?Medicare Important Message Given:  Yes ? ? ? ? ?Vincie Linn ?11/16/2021, 3:10 PM ?

## 2021-11-16 NOTE — Progress Notes (Signed)
Physical Therapy Treatment ?Patient Details ?Name: Mark Howe ?MRN: [id number] ?DOB: December 31, 1934 ?Today's Date: 11/16/2021 ? ? ?History of Present Illness Pt is a 87 y/o male presenting on 3/1 for fall/confusion. Admitted with rhabdomyolysis. CT/MRI negative. PMH includes: arthritis, basal cell CA, SOB, L THA. ? ?  ?PT Comments  ? ? Pt admitted with above diagnosis. Pt was able to sit EOB today with min guard assist to supervision for about 15 min.  Pt could not stand with assist.  Pt did scoot up to [facility] with mod assist.  Pt did better in this sessin than in previous session. Pt needs continued therapy at SNF as he is limited by confusion, poor safety awareness and balance and endurance issues. Pt will benefit from skilled PT to increase their independence and safety with mobility to allow discharge to the venue listed below.      ?Recommendations for follow up therapy are one component of a multi-disciplinary discharge planning process, led by the attending physician.  Recommendations may be updated based on patient status, additional functional criteria and insurance authorization. ? ?Follow Up Recommendations ? Skilled nursing-short term rehab (<3 hours/day) ?  ?  ?Assistance Recommended at Discharge Frequent or constant Supervision/Assistance  ?Patient can return home with the following Two people to help with walking and/or transfers;Two people to help with bathing/dressing/bathroom;Direct supervision/assist for medications management;Assistance with feeding;Assistance with cooking/housework;Direct supervision/assist for financial management;Assist for transportation;Help with stairs or ramp for entrance ?  ?Equipment Recommendations ? Other (comment) (TBD pt progression)  ?  ?Recommendations for Other Services Speech consult ? ? ?  ?Precautions / Restrictions Precautions ?Precautions: Fall ?Restrictions ?Weight Bearing Restrictions: No  ?  ? ?Mobility ? Bed Mobility ?Overal bed mobility: Needs Assistance ?Bed  Mobility: Rolling, Supine to Sit ?Rolling: Min assist ?  ?Supine to sit: Min assist, Mod assist, HOB elevated ?  ?  ?General bed mobility comments: Needed a little incr assist to come to EOB with PT moving feet off bed and assist with elevation of trunk. ?  ? ?Transfers ?Overall transfer level: Needs assistance ?Equipment used: 1 person hand held assist ?Transfers: Sit to/from Stand ?Sit to Stand: Max assist, From elevated surface ?  ?  ?  ?  ?  ?General transfer comment: Pt donned the right sock but PT had to don the left sock.  Attempted to have pt stand at EOB however even with max assist pt could not stand at EOB.  Used gait belt and could not get pt more than about 3 inches off bed. ?  ? ?Ambulation/Gait ?  ?  ?  ?  ?  ?  ?  ?  ? ? ?Stairs ?  ?  ?  ?  ?  ? ? ?Wheelchair Mobility ?  ? ?Modified Rankin (Stroke Patients Only) ?  ? ? ?  ?Balance Overall balance assessment: History of Falls, Needs assistance ?Sitting-balance support: No upper extremity supported, Feet supported ?Sitting balance-[name] Scale: Fair ?Sitting balance - Comments: Pt is able to sit EOB witout UE support and with good balance at EOB without challenges. ?  ?Standing balance support: Bilateral upper extremity supported, During functional activity ?Standing balance-[name] Scale: Zero ?Standing balance comment: cannot stand to full upright position ?  ?  ?  ?  ?  ?  ?  ?  ?  ?  ?  ?  ? ?  ?Cognition Arousal/Alertness: Awake/alert ?Behavior During Therapy: Anxious ?Overall Cognitive Status: No family/caregiver present to determine baseline cognitive functioning ?Area  of Impairment: Orientation, Following commands, Awareness, Problem solving, Safety/judgement ?  ?  ?  ?  ?  ?  ?  ?  ?Orientation Level: Disoriented to, Person, Place, Time, Situation ?  ?Memory: Decreased short-term memory ?Following Commands: Follows one step commands inconsistently ?Safety/Judgement: Decreased awareness of safety, Decreased awareness of deficits ?Awareness:  Intellectual ?Problem Solving: Slow processing, Decreased initiation, Difficulty sequencing, Requires verbal cues, Requires tactile cues ?General Comments: Poor awareness, not oriented and unable to follow commands. ?  ?  ? ?  ?Exercises   ? ?  ?General Comments   ?  ?  ? ?Pertinent Vitals/Pain Pain Assessment ?Pain Assessment: Faces ?Faces Pain Scale: Hurts even more ?Pain Location: generalized ?Pain Descriptors / Indicators: Discomfort, Crying ?Pain Intervention(s): Limited activity within patient's tolerance, Monitored during session, Repositioned  ? ? ?Home Living   ?Living Arrangements: Alone ?  ?  ?  ?  ?  ?  ?  ?  ?   ?  ?Prior Function    ?  ?  ?   ? ?PT Goals (current goals can now be found in the care plan section) Acute Rehab PT Goals ?Patient Stated Goal: unsure ?Progress towards PT goals: Progressing toward goals ? ?  ?Frequency ? ? ? Min 2X/week ? ? ? ?  ?PT Plan Current plan remains appropriate  ? ? ?Co-evaluation   ?  ?  ?  ?  ? ?  ?AM-PAC PT "6 Clicks" Mobility   ?Outcome Measure ? Help needed turning from your back to your side while in a flat bed without using bedrails?: A Little ?Help needed moving from lying on your back to sitting on the side of a flat bed without using bedrails?: A Little ?Help needed moving to and from a bed to a chair (including a wheelchair)?: Total ?Help needed standing up from a chair using your arms (e.g., wheelchair or bedside chair)?: Total ?Help needed to walk in hospital room?: Total ?Help needed climbing 3-5 steps with a railing? : Total ?6 Click Score: 10 ? ?  ?End of Session Equipment Utilized During Treatment: Gait belt ?Activity Tolerance: Patient limited by fatigue ?Patient left: in bed;with call bell/phone within reach;with bed alarm set ?Nurse Communication: Mobility status ?PT Visit Diagnosis: Muscle weakness (generalized) (M62.81);History of falling (Z91.81);Dizziness and giddiness (R42);Pain ?Pain - Right/Left: Left ?Pain - part of body: Hip ?  ? ? ?Time:  0940-1005 ?PT Time Calculation (min) (ACUTE ONLY): 25 min ? ?Charges:  $Therapeutic Activity: 23-37 mins          ?          ? ?Celsey Asselin M,PT ?Acute Rehab Services ?[phone] ?[phone] (pager)  ? ? ?Alvira Philips ?11/16/2021, 10:29 AM ? ?

## 2021-11-17 ENCOUNTER — Other Ambulatory Visit: Payer: Self-pay

## 2021-11-17 DIAGNOSIS — R2681 Unsteadiness on feet: Secondary | ICD-10-CM | POA: Diagnosis not present

## 2021-11-17 DIAGNOSIS — M545 Low back pain, unspecified: Secondary | ICD-10-CM | POA: Diagnosis not present

## 2021-11-17 DIAGNOSIS — Z7401 Bed confinement status: Secondary | ICD-10-CM | POA: Diagnosis not present

## 2021-11-17 DIAGNOSIS — F0150 Vascular dementia without behavioral disturbance: Secondary | ICD-10-CM | POA: Diagnosis not present

## 2021-11-17 DIAGNOSIS — M6281 Muscle weakness (generalized): Secondary | ICD-10-CM | POA: Diagnosis not present

## 2021-11-17 DIAGNOSIS — N401 Enlarged prostate with lower urinary tract symptoms: Secondary | ICD-10-CM | POA: Diagnosis not present

## 2021-11-17 DIAGNOSIS — G8929 Other chronic pain: Secondary | ICD-10-CM | POA: Diagnosis not present

## 2021-11-17 DIAGNOSIS — N179 Acute kidney failure, unspecified: Secondary | ICD-10-CM | POA: Diagnosis not present

## 2021-11-17 DIAGNOSIS — R5381 Other malaise: Secondary | ICD-10-CM | POA: Diagnosis not present

## 2021-11-17 DIAGNOSIS — R531 Weakness: Secondary | ICD-10-CM | POA: Diagnosis not present

## 2021-11-17 DIAGNOSIS — Z741 Need for assistance with personal care: Secondary | ICD-10-CM | POA: Diagnosis not present

## 2021-11-17 DIAGNOSIS — R7401 Elevation of levels of liver transaminase levels: Secondary | ICD-10-CM | POA: Diagnosis not present

## 2021-11-17 DIAGNOSIS — M6259 Muscle wasting and atrophy, not elsewhere classified, multiple sites: Secondary | ICD-10-CM | POA: Diagnosis not present

## 2021-11-17 DIAGNOSIS — F29 Unspecified psychosis not due to a substance or known physiological condition: Secondary | ICD-10-CM | POA: Diagnosis not present

## 2021-11-17 DIAGNOSIS — G9340 Encephalopathy, unspecified: Secondary | ICD-10-CM | POA: Diagnosis not present

## 2021-11-17 DIAGNOSIS — F01B2 Vascular dementia, moderate, with psychotic disturbance: Secondary | ICD-10-CM | POA: Diagnosis not present

## 2021-11-17 DIAGNOSIS — T796XXD Traumatic ischemia of muscle, subsequent encounter: Secondary | ICD-10-CM | POA: Diagnosis not present

## 2021-11-17 DIAGNOSIS — H6123 Impacted cerumen, bilateral: Secondary | ICD-10-CM | POA: Diagnosis not present

## 2021-11-17 DIAGNOSIS — F01B0 Vascular dementia, moderate, without behavioral disturbance, psychotic disturbance, mood disturbance, and anxiety: Secondary | ICD-10-CM | POA: Diagnosis not present

## 2021-11-17 DIAGNOSIS — M6282 Rhabdomyolysis: Secondary | ICD-10-CM | POA: Diagnosis not present

## 2021-11-17 DIAGNOSIS — R1312 Dysphagia, oropharyngeal phase: Secondary | ICD-10-CM | POA: Diagnosis not present

## 2021-11-17 DIAGNOSIS — N400 Enlarged prostate without lower urinary tract symptoms: Secondary | ICD-10-CM | POA: Diagnosis not present

## 2021-11-17 DIAGNOSIS — R41841 Cognitive communication deficit: Secondary | ICD-10-CM | POA: Diagnosis not present

## 2021-11-17 DIAGNOSIS — I951 Orthostatic hypotension: Secondary | ICD-10-CM | POA: Diagnosis not present

## 2021-11-17 MED ORDER — NYSTATIN 100000 UNIT/GM EX POWD: Freq: Three times a day (TID) | CUTANEOUS | 0 refills | Status: AC

## 2021-11-17 MED ORDER — WHITE PETROLATUM EX OINT: 1.0000 "application " | TOPICAL_OINTMENT | CUTANEOUS | 0 refills | Status: AC | PRN

## 2021-11-17 MED ORDER — ENSURE ENLIVE PO LIQD: 237.0000 mL | Freq: Two times a day (BID) | ORAL | 12 refills | Status: DC

## 2021-11-17 NOTE — Progress Notes (Signed)
Report called to [facility]. Reviewed admission information, assessment, medications and AVS.  RN verbalized understanding.  AVS printed and patient awaiting [name] pick up. ?

## 2021-11-17 NOTE — TOC Progression Note (Signed)
Transition of Care (TOC) - Progression Note  ? ? ?Patient Details  ?Name: Mark Howe ?MRN: [id number] ?Date of Birth: [birth year] ? ?Transition of Care (TOC) CM/SW Contact  ?Benard Halsted, LCSW ?Phone Number: ?11/17/2021, 8:52 AM ? ?Clinical Narrative:    ?Insurance approval received for Karyl Kinnier ID# [id number], effective until 11/18/21, Care Coordinator is Copy.  ? ? ?Expected Discharge Plan: [place] ?Barriers to Discharge: Barriers Resolved ? ?Expected Discharge Plan and Services ?Expected Discharge Plan: [place] ?In-house Referral: Clinical Social Work ?  ?Post Acute Care Choice: [place] ?Living arrangements for the past 2 months: [place] ?                ?  ?  ?  ?  ?  ?  ?  ?  ?  ?  ? ? ?Social Determinants of Health (SDOH) Interventions ?  ? ?Readmission Risk Interventions ?No flowsheet data found. ? ?

## 2021-11-17 NOTE — Progress Notes (Signed)
Occupational Therapy Treatment ?Patient Details ?Name: Mark Howe ?MRN: [id number] ?DOB: 12-Oct-1934 ?Today's Date: 11/17/2021 ? ? ?History of present illness Pt is a 87 y/o male presenting on 3/1 for fall/confusion. Admitted with rhabdomyolysis. CT/MRI negative. PMH includes: arthritis, basal cell CA, SOB, L THA. ?  ?OT comments ? Patient sitting up in recliner upon arrival completing lunch. Patient performed grooming with setup while seated in recliner. Patient performed sit to stands from recliner to RW with mod assist and addressed reaching tasks to test balance. Toilet transfer training performed with RW to [facility] with mod assist. Patient requested returning to bed at end of session with mod assist for transfer and min assist for bed mobility. Patient making good progress with OT treatment. Patient expected to discharge to SNF.  Goals to be reassessed if patient remains in this setting due to progress.   ? ?Recommendations for follow up therapy are one component of a multi-disciplinary discharge planning process, led by the attending physician.  Recommendations may be updated based on patient status, additional functional criteria and insurance authorization. ?   ?Follow Up Recommendations ? Skilled nursing-short term rehab (<3 hours/day)  ?  ?Assistance Recommended at Discharge Frequent or constant Supervision/Assistance  ?Patient can return home with the following ? A lot of help with walking and/or transfers;A little help with bathing/dressing/bathroom;Assistance with cooking/housework;Direct supervision/assist for medications management;Direct supervision/assist for financial management ?  ?Equipment Recommendations ? Other (comment) (TBD)  ?  ?Recommendations for Other Services   ? ?  ?Precautions / Restrictions Precautions ?Precautions: Fall ?Restrictions ?Weight Bearing Restrictions: No  ? ? ?  ? ?Mobility Bed Mobility ?Overal bed mobility: Needs Assistance ?Bed Mobility: Sit to Supine ?  ?  ?  ?Sit to supine:  Min assist ?  ?General bed mobility comments: min assist for LE ?  ? ?Transfers ?Overall transfer level: Needs assistance ?Equipment used: Rolling walker (2 wheels) ?Transfers: Sit to/from Stand, Bed to chair/wheelchair/BSC ?Sit to Stand: Mod assist ?  ?  ?Step pivot transfers: Mod assist ?  ?  ?General transfer comment: performed toilet transfes to [facility] with education on safety and hand placement. transferred to EOB end of session ?  ?  ?Balance Overall balance assessment: History of Falls, Needs assistance ?Sitting-balance support: No upper extremity supported, Feet supported ?Sitting balance-[name] Scale: Fair ?Sitting balance - Comments: able to sit on EOB without assistance ?  ?Standing balance support: Bilateral upper extremity supported, During functional activity ?Standing balance-[name] Scale: Fair ?Standing balance comment: able to stand to RW for reaching tasks ?  ?  ?  ?  ?  ?  ?  ?  ?  ?  ?  ?   ? ?ADL either performed or assessed with clinical judgement  ? ?ADL Overall ADL's : Needs assistance/impaired ?  ?  ?Grooming: Wash/dry hands;Wash/dry face;Set up;Sitting ?Grooming Details (indicate cue type and reason): able to perform grooming seated in recliner ?  ?  ?  ?  ?  ?  ?  ?  ?Toilet Transfer: Moderate assistance;BSC/3in1;Rolling walker (2 wheels) ?Toilet Transfer Details (indicate cue type and reason): mod assist due to assistance with RW.  Verbal cues for hand placement ?  ?  ?  ?  ?  ?General ADL Comments: improvement with following directions and performing functional transfers ?  ? ?Extremity/Trunk Assessment   ?  ?  ?  ?  ?  ? ?Vision   ?  ?  ?Perception   ?  ?Praxis   ?  ? ?  Cognition Arousal/Alertness: Awake/alert ?Behavior During Therapy: Anxious ?Overall Cognitive Status: No family/caregiver present to determine baseline cognitive functioning ?Area of Impairment: Orientation, Following commands, Awareness, Problem solving, Safety/judgement ?  ?  ?  ?  ?  ?  ?  ?  ?  ?  ?Memory: Decreased  short-term memory ?Following Commands: Follows one step commands consistently, Follows one step commands with increased time ?Safety/Judgement: Decreased awareness of safety, Decreased awareness of deficits ?Awareness: Intellectual ?Problem Solving: Slow processing, Decreased initiation, Difficulty sequencing, Requires verbal cues, Requires tactile cues ?General Comments: oriented to date and time.  Followed commands with increased time ?  ?  ?   ?Exercises   ? ?  ?Shoulder Instructions   ? ? ?  ?General Comments    ? ? ?Pertinent Vitals/ Pain       Pain Assessment ?Pain Assessment: Faces ?Faces Pain Scale: Hurts little more ?Pain Location: left hip ?Pain Descriptors / Indicators: Discomfort ?Pain Intervention(s): Limited activity within patient's tolerance, Monitored during session, Repositioned ? ?Home Living   ?  ?  ?  ?  ?  ?  ?  ?  ?  ?  ?  ?  ?  ?  ?  ?  ?  ?  ? ?  ?Prior Functioning/Environment    ?  ?  ?  ?   ? ?Frequency ? Min 2X/week  ? ? ? ? ?  ?Progress Toward Goals ? ?OT Goals(current goals can now be found in the care plan section) ? Progress towards OT goals: Progressing toward goals ? ?Acute Rehab OT Goals ?Patient Stated Goal: go to rehab ?OT Goal Formulation: With patient ?Time For Goal Achievement: 11/26/21 ?Potential to Achieve Goals: Fair ?ADL Goals ?Pt Will Perform Grooming: with min assist;sitting;bed level ?Pt Will Perform Upper Body Bathing: with min assist;sitting;bed level ?Pt Will Transfer to Toilet: with mod assist;with +2 assist;stand pivot transfer;bedside commode ?Additional ADL Goal #1: Pt will follow 1 step commands with 75% accuracy and demonstrate orientation to self and place. ?Additional ADL Goal #2: Pt will complete bed mobility with mod assist and maintain sitting balance at EOB with mod assist for 5 minutes as precursor to ADLs.  ?Plan Discharge plan remains appropriate   ? ?Co-evaluation ? ? ?   ?  ?  ?  ?  ? ?  ?AM-PAC OT "6 Clicks" Daily Activity     ?Outcome Measure ? ?  Help from another person eating meals?: A Little ?Help from another person taking care of personal grooming?: A Little ?Help from another person toileting, which includes using toliet, bedpan, or urinal?: A Lot ?Help from another person bathing (including washing, rinsing, drying)?: A Lot ?Help from another person to put on and taking off regular upper body clothing?: A Lot ?Help from another person to put on and taking off regular lower body clothing?: A Lot ?6 Click Score: 14 ? ?  ?End of Session Equipment Utilized During Treatment: Gait belt;Rolling walker (2 wheels) ? ?OT Visit Diagnosis: Other abnormalities of gait and mobility (R26.89);Muscle weakness (generalized) (M62.81);Pain;Other symptoms and signs involving cognitive function;Cognitive communication deficit (R41.841);History of falling (Z91.81) ?Pain - Right/Left: Left ?Pain - part of body: Hip ?  ?Activity Tolerance Patient tolerated treatment well ?  ?Patient Left in bed;with call bell/phone within reach;with bed alarm set ?  ?Nurse Communication Mobility status ?  ? ?   ? ?Time: 1950-9326 ?OT Time Calculation (min): 28 min ? ?Charges: OT General Charges ?$OT Visit: 1 Visit ?OT Treatments ?$Self  Care/Home Management : 8-22 mins ?$Therapeutic Activity: 8-22 mins ? ?Lodema Hong, OTA ?Acute Rehabilitation Services  ?Pager [phone] ?Office [phone] ? ? ?Trixie Dredge ?11/17/2021, 3:05 PM ?

## 2021-11-17 NOTE — Discharge Summary (Signed)
PATIENT DETAILS Name: Mark Howe Age: 87 y.o. Sex: male Date of Birth: 12-16-1934 MRN: [id number]. Admitting Physician: Jonetta Osgood, MD GEZ:[id number], [name], MD  Admit Date: 11/11/2021 Discharge date: 11/17/2021  Recommendations for Outpatient Follow-up:  Follow up with PCP in 1-2 weeks Please obtain CMP/CBC in one week Please ensure follow-up with urologist-Dr. Jeffie Pollock  Admitted From:  Home  Disposition: Skilled nursing facility   Discharge Condition: fair  CODE STATUS:   Code Status: Full Code   Diet recommendation:  Diet Order             Diet - low sodium heart healthy           Diet general           DIET DYS 2 Room service appropriate? Yes; Fluid consistency: Thin  Diet effective now                    Brief Summary: Patient is a 87 y.o.  male with history of BPH, HTN, HLD-presented to the hospital after being found down covered in urine/feces (family had not heard from him for 1 week)-patient was found to have AKI with rhabdomyolysis and subsequently admitted to the hospitalist service.    Significant Hospital events: 3/1>> found down at house covered in feces/urine-confused-admit to hospitalist service-with AKI/rhabdomyolysis/encephalopathy   Significant imaging studies: 3/1>> MRI brain: No acute intracranial abnormality 3/1>> x-ray left hip: No acute abnormality seen.   Significant microbiology data: 3/1>> COVID/influenza PCR: Negative   Procedures: 3/2>> EEG: No seizures.   Consults:  None  Brief Hospital Course: Rhabdomyolysis: Due to being down on the floor for several days-CK levels have normalized after IVF.   AKI: Likely hemodynamically mediated and from rhabdomyolysis.  Renal function has normalized with supportive care.   Acute metabolic encephalopathy: Probably due to AKI/dehydration-much improved with supportive care.  Suspect he is not far from baseline.  MRI brain/EEG negative.  Maintain delirium precautions as  patient is at some risk for hospital induced delirium.   Fall/found down at home-covered with urine/feces (last contact with family 2/21): Very poor historian-does not remember how he ended up on the floor for several days-he does remember falling.  MRI brain/EEG negative for seizures.  He does not think he syncopized.  Appreciate PT/OT eval-plans are for SNF.     Conjunctivitis: Already improving-continue erythromycin ophthalmic ointment x5 days total.   Transaminitis: Probably due to rhabdo-improving-follow periodically   BPH/hematuria: Followed by urologist Dr.Wrenn in the outpatient setting-continue Flomax.   Debility/deconditioning: Previously living by himself-has had significant decline in his functional status over the past several months-likely worsened by his acute illness.  Evaluated by PT/OT-recommendations are for SNF.   Social Issues: Patient has no spouse/children-his Sister [name] is his next of kin.  Spoke to patient's sister at length on 3/2-patient has refused help in the past-has been resistant to going to SNF in the past.  He apparently is a hoarder-lives alone-and has poor living conditions.  His overall functional level has gradually declined over the past several months-patient has had some issues with anemia, hematuria and BPH.  Plans are for SNF on discharge.   BMI: Estimated body mass index is 29.53 kg/m as calculated from the following:   Height as of this encounter: '5\' 9"'$  (1.753 m).   Weight as of this encounter: 90.7 kg.   RN pressure injury documentation: Pressure Injury 11/12/21 Buttocks Right Stage 2 -  Partial thickness loss of dermis presenting as a  shallow open injury with a red, pink wound bed without slough. (Active)  11/12/21 1828  Location: Buttocks  Location Orientation: Right  Staging: Stage 2 -  Partial thickness loss of dermis presenting as a shallow open injury with a red, pink wound bed without slough.  Wound Description (Comments):   Present on  Admission: Yes     Pressure Injury 11/12/21 Vertebral column Upper;Right;Left Stage 2 -  Partial thickness loss of dermis presenting as a shallow open injury with a red, pink wound bed without slough. (Active)  11/12/21 1829  Location: Vertebral column  Location Orientation: Upper;Right;Left  Staging: Stage 2 -  Partial thickness loss of dermis presenting as a shallow open injury with a red, pink wound bed without slough.  Wound Description (Comments):   Present on Admission: Yes     Discharge Diagnoses:  Principal Problem:   fall with rhabdomyolysis  Active Problems:   Benign prostatic hyperplasia   Iron deficiency anemia   Hematuria   AKI (acute kidney injury) ([place])   Diastolic dysfunction   Ambulatory dysfunction   Conjunctivitis   Elevated AST (SGOT)   Acute encephalopathy   Acute metabolic encephalopathy   Discharge Instructions:  Activity:  As tolerated with Full fall precautions use walker/cane & assistance as needed  Discharge Instructions     Call MD for:  difficulty breathing, headache or visual disturbances   Complete by: As directed    Call MD for:  redness, tenderness, or signs of infection (pain, swelling, redness, odor or green/yellow discharge around incision site)   Complete by: As directed    Diet - low sodium heart healthy   Complete by: As directed    Diet general   Complete by: As directed    Discharge instructions   Complete by: As directed    Follow with Primary MD  [name], MD in 1-2 weeks  Please get a complete blood count and chemistry panel checked by your Primary MD at your next visit, and again as instructed by your Primary MD.  Get Medicines reviewed and adjusted: Please take all your medications with you for your next visit with your Primary MD  Laboratory/radiological data: Please request your Primary MD to go over all hospital tests and procedure/radiological results at the follow up, please ask your Primary MD to get  all Hospital records sent to his/her office.  In some cases, they will be blood work, cultures and biopsy results pending at the time of your discharge. Please request that your primary care M.D. follows up on these results.  Also Note the following: If you experience worsening of your admission symptoms, develop shortness of breath, life threatening emergency, suicidal or homicidal thoughts you must seek medical attention immediately by calling 911 or calling your MD immediately  if symptoms less severe.  You must read complete instructions/literature along with all the possible adverse reactions/side effects for all the Medicines you take and that have been prescribed to you. Take any new Medicines after you have completely understood and accpet all the possible adverse reactions/side effects.   Do not drive when taking Pain medications or sleeping medications (Benzodaizepines)  Do not take more than prescribed Pain, Sleep and Anxiety Medications. It is not advisable to combine anxiety,sleep and pain medications without talking with your primary care practitioner  Special Instructions: If you have smoked or chewed Tobacco  in the last 2 yrs please stop smoking, stop any regular Alcohol  and or any Recreational drug use.  Wear  Seat belts while driving.  Please note: You were cared for by a hospitalist during your hospital stay. Once you are discharged, your primary care physician will handle any further medical issues. Please note that NO REFILLS for any discharge medications will be authorized once you are discharged, as it is imperative that you return to your primary care physician (or establish a relationship with a primary care physician if you do not have one) for your post hospital discharge needs so that they can reassess your need for medications and monitor your lab values.   Discharge wound care:   Complete by: As directed    1.) Wound care to left lateral back (thoracic area):  Cleanse with NS pat dry. Cover with xeroform gauze, top with dry gauze and cover with silicone foam. Change xeroform gauze daily, may reused silicone foam for up to 3 days.  2.)Wound care to full thickness wound in left hip incision:  Cleanse with NS, pat dry. Place size appropriate piece of silver hydrofiber (Aquacel Ag+ Advantage, Lawson # [id number]) over wound, top with dry gauze 2x2 and cover with silicone foam dressing.  Change Aquacel Ag+ Advantage daily, may reuse silicone foam for up to 3 days. Change PRN spoiling or dressing dislodgement.   Increase activity slowly   Complete by: As directed       Allergies as of 11/17/2021   No Known Allergies      Medication List     STOP taking these medications    OVER THE COUNTER MEDICATION       TAKE these medications    feeding supplement Liqd Take 237 mLs by mouth 2 (two) times daily between meals.   nystatin powder Commonly known as: MYCOSTATIN/NYSTOP Apply topically 3 (three) times daily for 5 days.   tamsulosin 0.4 MG Caps capsule Commonly known as: FLOMAX Take 0.4 mg by mouth daily.   white petrolatum Oint Commonly known as: VASELINE Apply 1 application. topically as needed for dry skin.               Discharge Care Instructions  (From admission, onward)           Start     Ordered   11/17/21 0000  Discharge wound care:       Comments: 1.) Wound care to left lateral back (thoracic area): Cleanse with NS pat dry. Cover with xeroform gauze, top with dry gauze and cover with silicone foam. Change xeroform gauze daily, may reused silicone foam for up to 3 days.  2.)Wound care to full thickness wound in left hip incision:  Cleanse with NS, pat dry. Place size appropriate piece of silver hydrofiber (Aquacel Ag+ Advantage, Lawson # [id number]) over wound, top with dry gauze 2x2 and cover with silicone foam dressing.  Change Aquacel Ag+ Advantage daily, may reuse silicone foam for up to 3 days. Change PRN spoiling or  dressing dislodgement.   11/17/21 2993            Contact information for follow-up providers     [name], MD. Schedule an appointment as soon as possible for a visit in 1 week(s).   Specialty: Family Medicine Contact information: [place] [address] [phone]         Irine Seal, MD. Schedule an appointment as soon as possible for a visit in 1 week(s).   Specialty: Urology Contact information: [place] [address] [phone]  Contact information for after-discharge care     Destination     [facility] Preferred SNF .   Service: Skilled Nursing Contact information: [address] [address] [phone]                    No Known Allergies   Other Procedures/Studies: DG Chest 1 View  Result Date: 11/11/2021 CLINICAL DATA:  Unwitnessed fall. EXAM: CHEST  1 VIEW COMPARISON:  November 25, 2011. FINDINGS: Stable cardiomediastinal silhouette. Elevated right hemidiaphragm is noted. Both lungs are clear. The visualized skeletal structures are unremarkable. IMPRESSION: No active disease. Electronically Signed   By: Marijo Conception M.D.   On: 11/11/2021 11:59   CT Head Wo Contrast  Result Date: 11/11/2021 CLINICAL DATA:  Altered mental status EXAM: CT HEAD WITHOUT CONTRAST TECHNIQUE: Contiguous axial images were obtained from the base of the skull through the vertex without intravenous contrast. RADIATION DOSE REDUCTION: This exam was performed according to the departmental dose-optimization program which includes automated exposure control, adjustment of the mA and/or kV according to patient size and/or use of iterative reconstruction technique. COMPARISON:  None. FINDINGS: Brain: No acute intracranial findings are seen. There are no signs of bleeding. Cortical sulci are prominent. Vascular: Unremarkable. Skull: Unremarkable. Sinuses/Orbits: There  is mucosal thickening in the ethmoid sinus. Other: None IMPRESSION: No acute intracranial findings are seen.  Atrophy. Chronic ethmoid sinusitis. Electronically Signed   By: Elmer Picker M.D.   On: 11/11/2021 12:06   MR BRAIN WO CONTRAST  Result Date: 11/12/2021 CLINICAL DATA:  Syncope EXAM: MRI HEAD WITHOUT CONTRAST TECHNIQUE: Multiplanar, multiecho pulse sequences of the brain and surrounding structures were obtained without intravenous contrast. COMPARISON:  None. FINDINGS: Brain: No acute infarct, mass effect or extra-axial collection. No acute or chronic hemorrhage. There is multifocal hyperintense T2-weighted signal within the white matter. Generalized volume loss without a clear lobar predilection. The midline structures are normal. Vascular: Major flow voids are preserved. Skull and upper cervical spine: Normal calvarium and skull base. Visualized upper cervical spine and soft tissues are normal. Sinuses/Orbits:No paranasal sinus fluid levels or advanced mucosal thickening. No mastoid or middle ear effusion. Normal orbits. IMPRESSION: 1. No acute intracranial abnormality. 2. Generalized volume loss and findings of chronic small vessel ischemia. Electronically Signed   By: Ulyses Jarred M.D.   On: 11/12/2021 01:16   EEG adult  Result Date: 11/12/2021 Lora Havens, MD     11/12/2021  3:25 PM Patient Name: Mark Howe MRN: [id number] Epilepsy Attending: Lora Havens Referring Physician/Provider: Jonetta Osgood, MD Date: 11/12/2021 Duration: 27.03 mins Patient history: 87 year old male found down with altered mental status.  EEG to evaluate for seizure. Level of alertness: Awake, asleep AEDs during EEG study: None Technical aspects: This EEG study was done with scalp electrodes positioned according to the 10-20 International system of electrode placement. Electrical activity was acquired at a sampling rate of '500Hz'$  and reviewed with a high frequency filter of '70Hz'$  and a low frequency  filter of '1Hz'$ . EEG data were recorded continuously and digitally stored. Description: The posterior dominant rhythm consists of 7.5 Hz activity of moderate voltage (25-35 uV) seen predominantly in posterior head regions, symmetric and reactive to eye opening and eye closing. Sleep was characterized by vertex waves, sleep spindles (12 to 14 Hz), maximal frontocentral region. EEG showed intermittent generalized 5 to 6 Hz theta slowing. Hyperventilation and photic stimulation were not performed.   ABNORMALITY - Intermittent  slow, generalized IMPRESSION: This study is suggestive of mild diffuse encephalopathy, nonspecific etiology. No seizures or epileptiform discharges were seen throughout the recording. Lora Havens   ECHOCARDIOGRAM COMPLETE  Result Date: 11/11/2021    ECHOCARDIOGRAM REPORT   Patient Name:   Mark Howe Date of Exam: 11/11/2021 Medical Rec #:  [id number]     Height:       69.0 in Accession #:    [id number]    Weight:       200.0 lb Date of Birth:  Jun 20, 1935      BSA:          2.066 m Patient Age:    50 years      BP:           179/87 mmHg Patient Gender: M             HR:           75 bpm. Exam Location:  Inpatient Procedure: 2D Echo Indications:    syncope  History:        Patient has prior history of Echocardiogram examinations, most                 recent 02/23/2021. Signs/Symptoms:Altered Mental Status; Risk                 Factors:Hypertension and Dyslipidemia.  Sonographer:    Johny Chess RDCS Referring Phys: [id number] Orma Flaming  Sonographer Comments: Image acquisition challenging due to uncooperative patient. IMPRESSIONS  1. Left ventricular ejection fraction, by estimation, is 55 to 60%. The left ventricle has normal function. The left ventricle has no regional wall motion abnormalities. There is moderate left ventricular hypertrophy. Left ventricular diastolic parameters are consistent with Grade I diastolic dysfunction (impaired relaxation).  2. Right ventricular systolic  function is normal. The right ventricular size is normal. There is moderately elevated pulmonary artery systolic pressure. The estimated right ventricular systolic pressure is 38.1 mmHg.  3. Left atrial size was mildly dilated.  4. The mitral valve is grossly normal. Mild to moderate mitral valve regurgitation.  5. The aortic valve is calcified. There is mild calcification of the aortic valve. There is mild thickening of the aortic valve. Aortic valve regurgitation is mild. FINDINGS  Left Ventricle: Left ventricular ejection fraction, by estimation, is 55 to 60%. The left ventricle has normal function. The left ventricle has no regional wall motion abnormalities. The left ventricular internal cavity size was normal in size. There is  moderate left ventricular hypertrophy. Left ventricular diastolic parameters are consistent with Grade I diastolic dysfunction (impaired relaxation). Right Ventricle: The right ventricular size is normal. Right vetricular wall thickness was not well visualized. Right ventricular systolic function is normal. There is moderately elevated pulmonary artery systolic pressure. The tricuspid regurgitant velocity is 3.31 m/s, and with an assumed right atrial pressure of 3 mmHg, the estimated right ventricular systolic pressure is 01.7 mmHg. Left Atrium: Left atrial size was mildly dilated. Right Atrium: Right atrial size was normal in size. Pericardium: There is no evidence of pericardial effusion. Mitral Valve: The mitral valve is grossly normal. Mild to moderate mitral valve regurgitation. Tricuspid Valve: The tricuspid valve is grossly normal. Tricuspid valve regurgitation is mild. Aortic Valve: The aortic valve is calcified. There is mild calcification of the aortic valve. There is mild thickening of the aortic valve. There is mild to moderate aortic valve annular calcification. Aortic valve regurgitation is mild. Aortic regurgitation PHT measures 547 msec. Pulmonic Valve: The pulmonic  valve was normal in structure. Pulmonic valve regurgitation is trivial. Aorta: The aortic root and ascending aorta are structurally normal, with no evidence of dilitation. IAS/Shunts: The atrial septum is grossly normal.  LEFT VENTRICLE PLAX 2D LVIDd:         4.80 cm      Diastology LVIDs:         3.40 cm      LV e' medial:    5.33 cm/s LV PW:         1.20 cm      LV E/e' medial:  9.8 LV IVS:        1.40 cm      LV e' lateral:   5.33 cm/s LVOT diam:     1.90 cm      LV E/e' lateral: 9.8 LVOT Area:     2.84 cm  LV Volumes (MOD) LV vol d, MOD A4C: 116.0 ml LV vol s, MOD A4C: 62.8 ml LV SV MOD A4C:     116.0 ml RIGHT VENTRICLE             IVC RV S prime:     17.30 cm/s  IVC diam: 2.20 cm TAPSE (M-mode): 2.5 cm LEFT ATRIUM              Index        RIGHT ATRIUM           Index LA diam:        3.70 cm  1.79 cm/m   RA Area:     12.70 cm LA Vol (A2C):   118.0 ml 57.12 ml/m  RA Volume:   22.00 ml  10.65 ml/m LA Vol (A4C):   76.6 ml  37.08 ml/m LA Biplane Vol: 97.4 ml  47.15 ml/m  AORTIC VALVE AI PHT:      547 msec  AORTA Ao Root diam: 3.10 cm Ao Asc diam:  3.30 cm MITRAL VALVE                  TRICUSPID VALVE MV Area (PHT): 3.72 cm       TR Peak grad:   43.8 mmHg MV Decel Time: 204 msec       TR Vmax:        331.00 cm/s MR Peak grad:    152.3 mmHg MR Mean grad:    84.0 mmHg    SHUNTS MR Vmax:         617.00 cm/s  Systemic Diam: 1.90 cm MR Vmean:        420.0 cm/s MR PISA:         2.26 cm MR PISA Eff ROA: 15 mm MR PISA Radius:  0.60 cm MV E velocity: 52.50 cm/s MV A velocity: 95.90 cm/s MV E/A ratio:  0.55 Mertie Moores MD Electronically signed by Mertie Moores MD Signature Date/Time: 11/11/2021/5:12:34 PM    Final    DG Hip Unilat W or Wo Pelvis 2-3 Views Left  Result Date: 11/11/2021 CLINICAL DATA:  Left hip pain after fall. EXAM: DG HIP (WITH OR WITHOUT PELVIS) 2-3V LEFT COMPARISON:  November 24, 2011. FINDINGS: Status post left total hip arthroplasty. No acute fracture or dislocation is noted. IMPRESSION: No  acute abnormality seen. Electronically Signed   By: Marijo Conception M.D.   On: 11/11/2021 12:01     TODAY-DAY OF DISCHARGE:  Subjective:   Mark Howe today has no headache,no chest abdominal pain,no new weakness tingling or numbness, feels much better wants to  go home today.   Objective:   Blood pressure (!) 153/64, pulse (!) 57, temperature 98.7 F (37.1 C), temperature source Oral, resp. rate 20, height '5\' 9"'$  (1.753 m), weight 90.7 kg, SpO2 98 %.  Intake/Output Summary (Last 24 hours) at 11/17/2021 0929 Last data filed at 11/17/2021 3016 Gross per 24 hour  Intake --  Output 1300 ml  Net -1300 ml   Filed Weights   11/11/21 1107  Weight: 90.7 kg    Exam: Awake Alert, Oriented *3, No new F.N deficits, Normal affect [place].AT,PERRAL Supple Neck,No JVD, No cervical lymphadenopathy appriciated.  Symmetrical Chest wall movement, Good air movement bilaterally, CTAB RRR,No Gallops,Rubs or new Murmurs, No Parasternal Heave +ve B.Sounds, Abd Soft, Non tender, No organomegaly appriciated, No rebound -guarding or rigidity. No Cyanosis, Clubbing or edema, No new Rash or bruise   PERTINENT RADIOLOGIC STUDIES: No results found.   PERTINENT LAB RESULTS: CBC: No results for input(s): WBC, HGB, HCT, PLT in the last 72 hours. CMET CMP     Component Value Date/Time   NA 135 11/13/2021 0213   K 4.1 11/13/2021 0213   CL 106 11/13/2021 0213   CO2 23 11/13/2021 0213   GLUCOSE 100 (H) 11/13/2021 0213   BUN 25 (H) 11/13/2021 0213   CREATININE 1.21 11/13/2021 0213   CALCIUM 8.3 (L) 11/13/2021 0213   PROT 5.8 (L) 11/13/2021 0213   ALBUMIN 2.4 (L) 11/13/2021 0213   AST 58 (H) 11/13/2021 0213   ALT 30 11/13/2021 0213   ALKPHOS 48 11/13/2021 0213   BILITOT 0.8 11/13/2021 0213   GFRNONAA 59 (L) 11/13/2021 0213   GFRAA 68 (L) 11/27/2011 0717    GFR Estimated Creatinine Clearance: 50.6 mL/min (by C-G formula based on SCr of 1.21 mg/dL). No results for input(s): LIPASE, AMYLASE in the last  72 hours. No results for input(s): CKTOTAL, CKMB, CKMBINDEX, TROPONINI in the last 72 hours. Invalid input(s): POCBNP No results for input(s): DDIMER in the last 72 hours. No results for input(s): HGBA1C in the last 72 hours. No results for input(s): CHOL, HDL, LDLCALC, TRIG, CHOLHDL, LDLDIRECT in the last 72 hours. No results for input(s): TSH, T4TOTAL, T3FREE, THYROIDAB in the last 72 hours.  Invalid input(s): FREET3 No results for input(s): VITAMINB12, FOLATE, FERRITIN, TIBC, IRON, RETICCTPCT in the last 72 hours. Coags: No results for input(s): INR in the last 72 hours.  Invalid input(s): PT Microbiology: Recent Results (from the past 240 hour(s))  Resp Panel by RT-PCR (Flu A&B, Covid) Nasopharyngeal Swab     Status: None   Collection Time: 11/11/21 12:00 PM   Specimen: Nasopharyngeal Swab; Nasopharyngeal(NP) swabs in vial transport medium  Result Value Ref Range Status   SARS Coronavirus 2 by RT PCR NEGATIVE NEGATIVE Final    Comment: (NOTE) SARS-CoV-2 target nucleic acids are NOT DETECTED.  The SARS-CoV-2 RNA is generally detectable in upper respiratory specimens during the acute phase of infection. The lowest concentration of SARS-CoV-2 viral copies this assay can detect is 138 copies/mL. A negative result does not preclude SARS-Cov-2 infection and should not be used as the sole basis for treatment or other patient management decisions. A negative result may occur with  improper specimen collection/handling, submission of specimen other than nasopharyngeal swab, presence of viral mutation(s) within the areas targeted by this assay, and inadequate number of viral copies(<138 copies/mL). A negative result must be combined with clinical observations, patient history, and epidemiological information. The expected result is Negative.  Fact Sheet for Patients:  EntrepreneurPulse.com.au  Fact  Sheet for Healthcare Providers:   IncredibleEmployment.be  This test is no t yet approved or cleared by the [place] FDA and  has been authorized for detection and/or diagnosis of SARS-CoV-2 by FDA under an Emergency Use Authorization (EUA). This EUA will remain  in effect (meaning this test can be used) for the duration of the COVID-19 declaration under Section 564(b)(1) of the Act, 21 U.S.C.section 360bbb-3(b)(1), unless the authorization is terminated  or revoked sooner.       Influenza A by PCR NEGATIVE NEGATIVE Final   Influenza B by PCR NEGATIVE NEGATIVE Final    Comment: (NOTE) The Xpert Xpress SARS-CoV-2/FLU/RSV plus assay is intended as an aid in the diagnosis of influenza from Nasopharyngeal swab specimens and should not be used as a sole basis for treatment. Nasal washings and aspirates are unacceptable for Xpert Xpress SARS-CoV-2/FLU/RSV testing.  Fact Sheet for Patients: EntrepreneurPulse.com.au  Fact Sheet for Healthcare Providers: IncredibleEmployment.be  This test is not yet approved or cleared by the [place] FDA and has been authorized for detection and/or diagnosis of SARS-CoV-2 by FDA under an Emergency Use Authorization (EUA). This EUA will remain in effect (meaning this test can be used) for the duration of the COVID-19 declaration under Section 564(b)(1) of the Act, 21 U.S.C. section 360bbb-3(b)(1), unless the authorization is terminated or revoked.  Performed at [facility], [place] [address], [place], [place] [address]   Resp Panel by RT-PCR (Flu A&B, Covid) Nasopharyngeal Swab     Status: None   Collection Time: 11/15/21  3:53 PM   Specimen: Nasopharyngeal Swab; Nasopharyngeal(NP) swabs in vial transport medium  Result Value Ref Range Status   SARS Coronavirus 2 by RT PCR NEGATIVE NEGATIVE Final    Comment: (NOTE) SARS-CoV-2 target nucleic acids are NOT DETECTED.  The SARS-CoV-2 RNA is generally detectable in  upper respiratory specimens during the acute phase of infection. The lowest concentration of SARS-CoV-2 viral copies this assay can detect is 138 copies/mL. A negative result does not preclude SARS-Cov-2 infection and should not be used as the sole basis for treatment or other patient management decisions. A negative result may occur with  improper specimen collection/handling, submission of specimen other than nasopharyngeal swab, presence of viral mutation(s) within the areas targeted by this assay, and inadequate number of viral copies(<138 copies/mL). A negative result must be combined with clinical observations, patient history, and epidemiological information. The expected result is Negative.  Fact Sheet for Patients:  EntrepreneurPulse.com.au  Fact Sheet for Healthcare Providers:  IncredibleEmployment.be  This test is no t yet approved or cleared by the [place] FDA and  has been authorized for detection and/or diagnosis of SARS-CoV-2 by FDA under an Emergency Use Authorization (EUA). This EUA will remain  in effect (meaning this test can be used) for the duration of the COVID-19 declaration under Section 564(b)(1) of the Act, 21 U.S.C.section 360bbb-3(b)(1), unless the authorization is terminated  or revoked sooner.       Influenza A by PCR NEGATIVE NEGATIVE Final   Influenza B by PCR NEGATIVE NEGATIVE Final    Comment: (NOTE) The Xpert Xpress SARS-CoV-2/FLU/RSV plus assay is intended as an aid in the diagnosis of influenza from Nasopharyngeal swab specimens and should not be used as a sole basis for treatment. Nasal washings and aspirates are unacceptable for Xpert Xpress SARS-CoV-2/FLU/RSV testing.  Fact Sheet for Patients: EntrepreneurPulse.com.au  Fact Sheet for Healthcare Providers: IncredibleEmployment.be  This test is not yet approved or cleared by the [place]  FDA and has been  authorized for detection and/or diagnosis of SARS-CoV-2 by FDA under an Emergency Use Authorization (EUA). This EUA will remain in effect (meaning this test can be used) for the duration of the COVID-19 declaration under Section 564(b)(1) of the Act, 21 U.S.C. section 360bbb-3(b)(1), unless the authorization is terminated or revoked.  Performed at [facility], [place] [address] [place], [place] [address]     FURTHER DISCHARGE INSTRUCTIONS:  Get Medicines reviewed and adjusted: Please take all your medications with you for your next visit with your Primary MD  Laboratory/radiological data: Please request your Primary MD to go over all hospital tests and procedure/radiological results at the follow up, please ask your Primary MD to get all Hospital records sent to his/her office.  In some cases, they will be blood work, cultures and biopsy results pending at the time of your discharge. Please request that your primary care M.D. goes through all the records of your hospital data and follows up on these results.  Also Note the following: If you experience worsening of your admission symptoms, develop shortness of breath, life threatening emergency, suicidal or homicidal thoughts you must seek medical attention immediately by calling 911 or calling your MD immediately  if symptoms less severe.  You must read complete instructions/literature along with all the possible adverse reactions/side effects for all the Medicines you take and that have been prescribed to you. Take any new Medicines after you have completely understood and accpet all the possible adverse reactions/side effects.   Do not drive when taking Pain medications or sleeping medications (Benzodaizepines)  Do not take more than prescribed Pain, Sleep and Anxiety Medications. It is not advisable to combine anxiety,sleep and pain medications without talking with your primary care practitioner  Special Instructions: If you  have smoked or chewed Tobacco  in the last 2 yrs please stop smoking, stop any regular Alcohol  and or any Recreational drug use.  Wear Seat belts while driving.  Please note: You were cared for by a hospitalist during your hospital stay. Once you are discharged, your primary care physician will handle any further medical issues. Please note that NO REFILLS for any discharge medications will be authorized once you are discharged, as it is imperative that you return to your primary care physician (or establish a relationship with a primary care physician if you do not have one) for your post hospital discharge needs so that they can reassess your need for medications and monitor your lab values.  Total Time spent coordinating discharge including counseling, education and face to face time equals greater than 30 minutes.  SignedOren Binet 11/17/2021 9:29 AM

## 2021-11-17 NOTE — TOC Transition Note (Signed)
Transition of Care (TOC) - CM/SW Discharge Note ? ? ?Patient Details  ?Name: Mark Howe ?MRN: [id number] ?Date of Birth: Jan 11, 1935 ? ?Transition of Care (TOC) CM/SW Contact:  ?Benard Halsted, LCSW ?Phone Number: ?11/17/2021, 1:07 PM ? ? ?Clinical Narrative:    ?Patient will DC to: [facility] ?Anticipated DC date: 11/17/21 ?Family notified: Sister, [name] ?Transport by: Corey Jarry ? ? ?Per MD patient ready for DC to [facility]. RN to call report prior to discharge [phone]). RN, patient, patient's family, and facility notified of DC. Discharge Summary and FL2 sent to facility. DC packet on chart. Ambulance transport requested for patient.  ? ?CSW will sign off for now as social work intervention is no longer needed. Please consult [place] again if new needs arise. ? ? ? ? ?Final next level of care: [facility] ?Barriers to Discharge: Barriers Resolved ? ? ?Patient Goals and CMS Choice ?Patient states their goals for this hospitalization and ongoing recovery are:: Rehab ?CMS Medicare.gov Compare Post Acute Care list provided to:: Patient ?Choice offered to / list presented to : Patient, Sibling ? ?Discharge Placement ?  ?Existing PASRR number confirmed : 11/17/21          ?Patient chooses bed at: [facility] ?Patient to be transferred to facility by: [name] ?Name of family member notified: Sister ?Patient and family notified of of transfer: 11/17/21 ? ?Discharge Plan and Services ?In-house Referral: Clinical Social Work ?  ?Post Acute Care Choice: [facility]          ?  ?  ?  ?  ?  ?  ?  ?  ?  ?  ? ?Social Determinants of Health (SDOH) Interventions ?  ? ? ?Readmission Risk Interventions ?No flowsheet data found. ? ? ? ? ?

## 2021-11-18 ENCOUNTER — Encounter: Payer: Self-pay | Admitting: Adult Health

## 2021-11-18 ENCOUNTER — Non-Acute Institutional Stay (SKILLED_NURSING_FACILITY): Payer: Medicare Other | Admitting: Adult Health

## 2021-11-18 DIAGNOSIS — N179 Acute kidney failure, unspecified: Secondary | ICD-10-CM

## 2021-11-18 DIAGNOSIS — M6282 Rhabdomyolysis: Secondary | ICD-10-CM | POA: Diagnosis not present

## 2021-11-18 DIAGNOSIS — R7401 Elevation of levels of liver transaminase levels: Secondary | ICD-10-CM

## 2021-11-18 DIAGNOSIS — N400 Enlarged prostate without lower urinary tract symptoms: Secondary | ICD-10-CM

## 2021-11-18 DIAGNOSIS — R5381 Other malaise: Secondary | ICD-10-CM

## 2021-11-18 DIAGNOSIS — T796XXS Traumatic ischemia of muscle, sequela: Secondary | ICD-10-CM

## 2021-11-18 NOTE — Progress Notes (Signed)
Location:  [facility] Room Number: 215-A Place of Service:  SNF (31) Provider:  Durenda Age, DNP, FNP-BC  Patient Care Team: Mark Smoker, MD as PCP - General (Family Medicine)  Extended Emergency Contact Information Primary Emergency Contact: Mark Howe Mobile Phone: [phone] Relation: Sister  Code Status:  FULL CODE  Goals of care: Advanced Directive information Advanced Directives 11/18/2021  Does Patient Have a Medical Advance Directive? No  Type of Advance Directive -  Copy of Progress in Chart? -  Would patient like information on creating a medical advance directive? No - Patient declined     Chief Complaint  Patient presents with   Hospitalization Follow-up    Hospital Follow Up.    HPI:  Pt is a 87 y.o. male who was admitted to [facility] on 11/17/21 post hospital admission 11/11/21 to 11/17/2021.  He has a PMH of BPH, IDA, hyperlipidemia, CHF and history of hematuria followed by urology.  He was found down covered in urine/feces by sister who did not hear from him for a week.  He was found to have AKI with rhabdomyolysis.  MRI brain showed no acute intracranial abnormality.  X-ray of left hip showed no acute abnormality.  EEG showed no seizures.  He was given IV fluids and CK levels had normalized.  He was seen in his room today with sister at bedside. He stated that he feels weak and dizzy when standing.  Past Medical History:  Diagnosis Date   Arthritis    L hip & R knee- OA   Cancer ([place])    basal cell CA- face   Shortness of breath    Past Surgical History:  Procedure Laterality Date   APPENDECTOMY     [facility]- 2011   EYE SURGERY     cataracts removed, laser (bilateral)   NASAL SINUS SURGERY     [name]- 1980's    TOTAL HIP ARTHROPLASTY  11/24/2011   Procedure: TOTAL HIP ARTHROPLASTY;  Surgeon: Ninetta Lights, MD;  Location: [facility];  Service:  Orthopedics;  Laterality: Left;   variose     varicose vein surgery    No Known Allergies  Outpatient Encounter Medications as of 11/18/2021  Medication Sig   feeding supplement (ENSURE ENLIVE / ENSURE PLUS) LIQD Take 237 mLs by mouth 2 (two) times daily between meals.   nystatin (MYCOSTATIN/NYSTOP) powder Apply topically 3 (three) times daily for 5 days.   Tamsulosin HCl (FLOMAX) 0.4 MG CAPS Take 0.4 mg by mouth daily.   white petrolatum (VASELINE) OINT Apply 1 application. topically as needed for dry skin.   No facility-administered encounter medications on file as of 11/18/2021.    Review of Systems  Constitutional:  Negative for activity change, appetite change and fever.  HENT:  Negative for sore throat.   Eyes: Negative.   Cardiovascular:  Negative for chest pain and leg swelling.  Gastrointestinal:  Negative for abdominal distention, diarrhea and vomiting.  Genitourinary:  Negative for dysuria, frequency and urgency.  Skin:  Negative for color change.  Neurological:  Negative for dizziness and headaches.  Psychiatric/Behavioral:  Negative for behavioral problems and sleep disturbance. The patient is not nervous/anxious.       Immunization History  Administered Date(s) Administered   Influenza, High Dose Seasonal PF 05/25/2018   Pertinent  Health Maintenance Due  Topic Date Due   INFLUENZA VACCINE  04/13/2021   Fall Risk 11/15/2021 11/16/2021 11/16/2021 11/17/2021  11/17/2021  Patient Fall Risk Level High fall risk High fall risk High fall risk High fall risk High fall risk     Vitals:   11/18/21 1123  BP: 116/60  Pulse: 60  Resp: 20  Temp: (!) 97.5 F (36.4 C)  Weight: 164 lb (74.4 kg)  Height: '5\' 9"'$  (1.753 m)   Body mass index is 24.22 kg/m.  Physical Exam Constitutional:      Appearance: Normal appearance.  HENT:     Head: Normocephalic and atraumatic.     Mouth/Throat:     Mouth: Mucous membranes are moist.  Eyes:     Conjunctiva/sclera: Conjunctivae normal.   Cardiovascular:     Rate and Rhythm: Normal rate and regular rhythm.     Pulses: Normal pulses.     Heart sounds: Normal heart sounds.  Pulmonary:     Effort: Pulmonary effort is normal.     Breath sounds: Normal breath sounds.  Abdominal:     General: Bowel sounds are normal.     Palpations: Abdomen is soft.  Musculoskeletal:        General: No swelling. Normal range of motion.     Cervical back: Normal range of motion.  Skin:    General: Skin is warm and dry.     Comments: Chronic nonhealing wound on left hip and open wound on left lateral back.  Neurological:     Mental Status: He is alert and oriented to person, place, and time. Mental status is at baseline.  Psychiatric:        Mood and Affect: Mood normal.        Behavior: Behavior normal.        Thought Content: Thought content normal.        Judgment: Judgment normal.     Labs reviewed: Recent Labs    11/11/21 1115 11/12/21 1045 11/13/21 0213  NA 137 140 135  K 4.5 4.2 4.1  CL 102 108 106  CO2 '22 24 23  '$ GLUCOSE 91 91 100*  BUN 38* 32* 25*  CREATININE 1.55* 1.24 1.21  CALCIUM 9.1 8.6* 8.3*  MG 2.3  --   --    Recent Labs    11/11/21 1115 11/12/21 1045 11/13/21 0213  AST 110* 71* 58*  ALT 34 31 30  ALKPHOS 58 50 48  BILITOT 1.7* 0.9 0.8  PROT 7.0 6.0* 5.8*  ALBUMIN 2.8* 2.4* 2.4*   Recent Labs    11/11/21 1115 11/12/21 1045 11/13/21 0213  WBC 10.8* 8.9 7.2  NEUTROABS 8.4*  --   --   HGB 12.3* 10.9* 10.2*  HCT 36.8* 34.7* 31.0*  MCV 93.9 98.9 94.8  PLT 270 233 239   Lab Results  Component Value Date   TSH 3.939 11/11/2021   No results found for: HGBA1C No results found for: CHOL, HDL, LDLCALC, LDLDIRECT, TRIG, CHOLHDL  Significant Diagnostic Results in last 30 days:  DG Chest 1 View  Result Date: 11/11/2021 CLINICAL DATA:  Unwitnessed fall. EXAM: CHEST  1 VIEW COMPARISON:  November 25, 2011. FINDINGS: Stable cardiomediastinal silhouette. Elevated right hemidiaphragm is noted. Both lungs  are clear. The visualized skeletal structures are unremarkable. IMPRESSION: No active disease. Electronically Signed   By: Marijo Conception M.D.   On: 11/11/2021 11:59   CT Head Wo Contrast  Result Date: 11/11/2021 CLINICAL DATA:  Altered mental status EXAM: CT HEAD WITHOUT CONTRAST TECHNIQUE: Contiguous axial images were obtained from the base of the skull through the vertex without intravenous  contrast. RADIATION DOSE REDUCTION: This exam was performed according to the departmental dose-optimization program which includes automated exposure control, adjustment of the mA and/or kV according to patient size and/or use of iterative reconstruction technique. COMPARISON:  None. FINDINGS: Brain: No acute intracranial findings are seen. There are no signs of bleeding. Cortical sulci are prominent. Vascular: Unremarkable. Skull: Unremarkable. Sinuses/Orbits: There is mucosal thickening in the ethmoid sinus. Other: None IMPRESSION: No acute intracranial findings are seen.  Atrophy. Chronic ethmoid sinusitis. Electronically Signed   By: Elmer Picker M.D.   On: 11/11/2021 12:06   MR BRAIN WO CONTRAST  Result Date: 11/12/2021 CLINICAL DATA:  Syncope EXAM: MRI HEAD WITHOUT CONTRAST TECHNIQUE: Multiplanar, multiecho pulse sequences of the brain and surrounding structures were obtained without intravenous contrast. COMPARISON:  None. FINDINGS: Brain: No acute infarct, mass effect or extra-axial collection. No acute or chronic hemorrhage. There is multifocal hyperintense T2-weighted signal within the white matter. Generalized volume loss without a clear lobar predilection. The midline structures are normal. Vascular: Major flow voids are preserved. Skull and upper cervical spine: Normal calvarium and skull base. Visualized upper cervical spine and soft tissues are normal. Sinuses/Orbits:No paranasal sinus fluid levels or advanced mucosal thickening. No mastoid or middle ear effusion. Normal orbits. IMPRESSION: 1. No  acute intracranial abnormality. 2. Generalized volume loss and findings of chronic small vessel ischemia. Electronically Signed   By: Ulyses Jarred M.D.   On: 11/12/2021 01:16   EEG adult  Result Date: 11/12/2021 Lora Havens, MD     11/12/2021  3:25 PM Patient Name: Mark Howe MRN: [id number] Epilepsy Attending: Lora Havens Referring Physician/Provider: Jonetta Osgood, MD Date: 11/12/2021 Duration: 27.03 mins Patient history: 87 year old male found down with altered mental status.  EEG to evaluate for seizure. Level of alertness: Awake, asleep AEDs during EEG study: None Technical aspects: This EEG study was done with scalp electrodes positioned according to the 10-20 International system of electrode placement. Electrical activity was acquired at a sampling rate of '500Hz'$  and reviewed with a high frequency filter of '70Hz'$  and a low frequency filter of '1Hz'$ . EEG data were recorded continuously and digitally stored. Description: The posterior dominant rhythm consists of 7.5 Hz activity of moderate voltage (25-35 uV) seen predominantly in posterior head regions, symmetric and reactive to eye opening and eye closing. Sleep was characterized by vertex waves, sleep spindles (12 to 14 Hz), maximal frontocentral region. EEG showed intermittent generalized 5 to 6 Hz theta slowing. Hyperventilation and photic stimulation were not performed.   ABNORMALITY - Intermittent slow, generalized IMPRESSION: This study is suggestive of mild diffuse encephalopathy, nonspecific etiology. No seizures or epileptiform discharges were seen throughout the recording. Lora Havens   ECHOCARDIOGRAM COMPLETE  Result Date: 11/11/2021    ECHOCARDIOGRAM REPORT   Patient Name:   ARMANI BRAR Date of Exam: 11/11/2021 Medical Rec #:  [id number]     Height:       69.0 in Accession #:    [id number]    Weight:       200.0 lb Date of Birth:  1935-07-21      BSA:          2.066 m Patient Age:    51 years      BP:           179/87 mmHg  Patient Gender: M             HR:           75  bpm. Exam Location:  Inpatient Procedure: 2D Echo Indications:    syncope  History:        Patient has prior history of Echocardiogram examinations, most                 recent 02/23/2021. Signs/Symptoms:Altered Mental Status; Risk                 Factors:Hypertension and Dyslipidemia.  Sonographer:    Johny Chess RDCS Referring Phys: [id number] Orma Flaming  Sonographer Comments: Image acquisition challenging due to uncooperative patient. IMPRESSIONS  1. Left ventricular ejection fraction, by estimation, is 55 to 60%. The left ventricle has normal function. The left ventricle has no regional wall motion abnormalities. There is moderate left ventricular hypertrophy. Left ventricular diastolic parameters are consistent with Grade I diastolic dysfunction (impaired relaxation).  2. Right ventricular systolic function is normal. The right ventricular size is normal. There is moderately elevated pulmonary artery systolic pressure. The estimated right ventricular systolic pressure is 74.0 mmHg.  3. Left atrial size was mildly dilated.  4. The mitral valve is grossly normal. Mild to moderate mitral valve regurgitation.  5. The aortic valve is calcified. There is mild calcification of the aortic valve. There is mild thickening of the aortic valve. Aortic valve regurgitation is mild. FINDINGS  Left Ventricle: Left ventricular ejection fraction, by estimation, is 55 to 60%. The left ventricle has normal function. The left ventricle has no regional wall motion abnormalities. The left ventricular internal cavity size was normal in size. There is  moderate left ventricular hypertrophy. Left ventricular diastolic parameters are consistent with Grade I diastolic dysfunction (impaired relaxation). Right Ventricle: The right ventricular size is normal. Right vetricular wall thickness was not well visualized. Right ventricular systolic function is normal. There is moderately elevated  pulmonary artery systolic pressure. The tricuspid regurgitant velocity is 3.31 m/s, and with an assumed right atrial pressure of 3 mmHg, the estimated right ventricular systolic pressure is 81.4 mmHg. Left Atrium: Left atrial size was mildly dilated. Right Atrium: Right atrial size was normal in size. Pericardium: There is no evidence of pericardial effusion. Mitral Valve: The mitral valve is grossly normal. Mild to moderate mitral valve regurgitation. Tricuspid Valve: The tricuspid valve is grossly normal. Tricuspid valve regurgitation is mild. Aortic Valve: The aortic valve is calcified. There is mild calcification of the aortic valve. There is mild thickening of the aortic valve. There is mild to moderate aortic valve annular calcification. Aortic valve regurgitation is mild. Aortic regurgitation PHT measures 547 msec. Pulmonic Valve: The pulmonic valve was normal in structure. Pulmonic valve regurgitation is trivial. Aorta: The aortic root and ascending aorta are structurally normal, with no evidence of dilitation. IAS/Shunts: The atrial septum is grossly normal.  LEFT VENTRICLE PLAX 2D LVIDd:         4.80 cm      Diastology LVIDs:         3.40 cm      LV e' medial:    5.33 cm/s LV PW:         1.20 cm      LV E/e' medial:  9.8 LV IVS:        1.40 cm      LV e' lateral:   5.33 cm/s LVOT diam:     1.90 cm      LV E/e' lateral: 9.8 LVOT Area:     2.84 cm  LV Volumes (MOD) LV vol d, MOD A4C: 116.0 ml LV vol s, MOD  A4C: 62.8 ml LV SV MOD A4C:     116.0 ml RIGHT VENTRICLE             IVC RV S prime:     17.30 cm/s  IVC diam: 2.20 cm TAPSE (M-mode): 2.5 cm LEFT ATRIUM              Index        RIGHT ATRIUM           Index LA diam:        3.70 cm  1.79 cm/m   RA Area:     12.70 cm LA Vol (A2C):   118.0 ml 57.12 ml/m  RA Volume:   22.00 ml  10.65 ml/m LA Vol (A4C):   76.6 ml  37.08 ml/m LA Biplane Vol: 97.4 ml  47.15 ml/m  AORTIC VALVE AI PHT:      547 msec  AORTA Ao Root diam: 3.10 cm Ao Asc diam:  3.30 cm MITRAL  VALVE                  TRICUSPID VALVE MV Area (PHT): 3.72 cm       TR Peak grad:   43.8 mmHg MV Decel Time: 204 msec       TR Vmax:        331.00 cm/s MR Peak grad:    152.3 mmHg MR Mean grad:    84.0 mmHg    SHUNTS MR Vmax:         617.00 cm/s  Systemic Diam: 1.90 cm MR Vmean:        420.0 cm/s MR PISA:         2.26 cm MR PISA Eff ROA: 15 mm MR PISA Radius:  0.60 cm MV E velocity: 52.50 cm/s MV A velocity: 95.90 cm/s MV E/A ratio:  0.55 Mertie Moores MD Electronically signed by Mertie Moores MD Signature Date/Time: 11/11/2021/5:12:34 PM    Final    DG Hip Unilat W or Wo Pelvis 2-3 Views Left  Result Date: 11/11/2021 CLINICAL DATA:  Left hip pain after fall. EXAM: DG HIP (WITH OR WITHOUT PELVIS) 2-3V LEFT COMPARISON:  November 24, 2011. FINDINGS: Status post left total hip arthroplasty. No acute fracture or dislocation is noted. IMPRESSION: No acute abnormality seen. Electronically Signed   By: Marijo Conception M.D.   On: 11/11/2021 12:01    Assessment/Plan  1. Non-traumatic rhabdomyolysis -   S/P fall -    Was given IV fluids and CK levels normalized  2. AKI (acute kidney injury) [facility] Lab Results  Component Value Date   NA 135 11/13/2021   K 4.1 11/13/2021   CO2 23 11/13/2021   GLUCOSE 100 (H) 11/13/2021   BUN 25 (H) 11/13/2021   CREATININE 1.21 11/13/2021   CALCIUM 8.3 (L) 11/13/2021   GFRNONAA 59 (L) 11/13/2021   -   will monitor  3. Benign prostatic hyperplasia without lower urinary tract symptoms -    Continue tamsulosin 0.4 mg 1 capsule daily -    Follow-up with urology, Dr. Jeffie Pollock  4. Elevated AST (SGOT) Lab Results  Component Value Date   ALT 30 11/13/2021   AST 58 (H) 11/13/2021   ALKPHOS 48 11/13/2021   BILITOT 0.8 11/13/2021   -  improving  5. Physical deconditioning -    will get orthostatic BPs -   For PT and OT, for therapeutic strengthening exercises    Family/ staff Communication: Discussed plan of care with resident and charge  nurse  Labs/tests ordered:    CBC and BMP in 1 week    Durenda Age, DNP, MSN, FNP-BC [facility] [phone] (Monday-Friday 8:00 a.m. - 5:00 p.m.) [phone] (after hours)

## 2021-11-19 ENCOUNTER — Non-Acute Institutional Stay (SKILLED_NURSING_FACILITY): Payer: Medicare Other | Admitting: Internal Medicine

## 2021-11-19 ENCOUNTER — Encounter: Payer: Self-pay | Admitting: Internal Medicine

## 2021-11-19 DIAGNOSIS — N179 Acute kidney failure, unspecified: Secondary | ICD-10-CM

## 2021-11-19 DIAGNOSIS — R29818 Other symptoms and signs involving the nervous system: Secondary | ICD-10-CM | POA: Insufficient documentation

## 2021-11-19 DIAGNOSIS — T796XXD Traumatic ischemia of muscle, subsequent encounter: Secondary | ICD-10-CM | POA: Diagnosis not present

## 2021-11-19 DIAGNOSIS — R4189 Other symptoms and signs involving cognitive functions and awareness: Secondary | ICD-10-CM | POA: Insufficient documentation

## 2021-11-19 DIAGNOSIS — T148XXD Other injury of unspecified body region, subsequent encounter: Secondary | ICD-10-CM

## 2021-11-19 DIAGNOSIS — G9340 Encephalopathy, unspecified: Secondary | ICD-10-CM | POA: Diagnosis not present

## 2021-11-19 DIAGNOSIS — D509 Iron deficiency anemia, unspecified: Secondary | ICD-10-CM

## 2021-11-19 DIAGNOSIS — R7401 Elevation of levels of liver transaminase levels: Secondary | ICD-10-CM

## 2021-11-19 DIAGNOSIS — I951 Orthostatic hypotension: Secondary | ICD-10-CM

## 2021-11-19 DIAGNOSIS — E440 Moderate protein-calorie malnutrition: Secondary | ICD-10-CM

## 2021-11-19 DIAGNOSIS — E46 Unspecified protein-calorie malnutrition: Secondary | ICD-10-CM | POA: Insufficient documentation

## 2021-11-19 NOTE — Assessment & Plan Note (Signed)
Wound care nurse to assess.  Orthopedic follow-up for possible debridement. ?

## 2021-11-19 NOTE — Assessment & Plan Note (Signed)
CK was 1915 at presentation; post IV resuscitation CK high normal at 381.  He is not on a statin. ?

## 2021-11-19 NOTE — Assessment & Plan Note (Signed)
Current total protein 6.0 and albumin 2.4.  Nutrition consult at SNF. ?

## 2021-11-19 NOTE — Progress Notes (Signed)
? ?NURSING HOME LOCATION:  [place] ?ROOM NUMBER:  215 ? ?CODE STATUS:  Full Code ? ?PCP:  Sela Hilding MD ? ?This is a comprehensive admission note to this SNFperformed on this date less than 30 days from date of admission. ?Included are preadmission medical/surgical history; reconciled medication list; family history; social history and comprehensive review of systems.  ?Corrections and additions to the records were documented. Comprehensive physical exam was also performed. Additionally a clinical summary was entered for each active diagnosis pertinent to this admission in the Problem List to enhance continuity of care. ? ?HPI: Patient was hospitalized 3/1 - 11/17/2021 after being found down covered in urine and feces for unknown period of time.  By history the family had not heard from him for 1 week.  Clinically acute metabolic encephalopathy was present.  AKI, transaminitis and rhabdomyolysis were documented.  CNS imaging revealed no intracranial abnormality and EEG revealed no seizure activity. Left hip imaging revealed no fracture. ?Course was complicated by conjunctivitis for which he received erythromycin ointment x5 days. ?With fluid resuscitation; rhabdomyolysis and AKI resolved. ?The patient had been living by himself but according to family there has been progressive functional decline over several months.  Patient had been resistant to SNF placement in the past.  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 ?PT/OT recommended SNF placement for rehab. ? ?Past medical and surgical history: Includes BPH, history of iron deficiency anemia, diastolic dysfunction, osteoarthritis, and history of basal cell skin cancer. ?Significant surgeries and procedures include THA and varicose vein surgery. ? ?Social history: As noted he lives by himself and has no spouse or children.  Next of kin is his sister [name].  He is a nondrinker;ex smoker. ? ?Family  history: Noncontributory due to advanced age. ?  ?Review of systems: Clinical neurocognitive deficits made validity of responses questionable as BIMS score 11/15. ?PT/OT states that he is noncompliant with their instructions as they are working with him and also exhibited inappropriate behavior.  For example he stopped and asked to raise his leg while he was using the parallel bars to help assist ambulation.  Additionally he was not picking up his feet ,but exhibiting a shuffling gait. ?He states that he has had chronic dizziness for at least 3-4 weeks PTA.  Apparently he fell with such at a local restaurant 3-4 weeks PTA.  He believes he had minor injury to his hip &  knee and also struck his head.  He did not seek medical assessment at that time.   ?He also describes chronic tinnitus for over 10 years.  He describes vertiginous symptoms with the room spinning upon arising for at least a month. ?Severe dysphagia is a chronic problem.  He describes chronic anxiety & depression.  He also has nocturia 5-6 times per night and questioned stopping Flomax.  He also describes intermittent dysuria. ?He describes weakness in the LLE since his prior left hip surgery.  Also he describes draining from the wound at the left hip.  He denies any associated purulent drainage or fever. ? ?Constitutional: No fever, significant weight change  ?Eyes: No residual redness, discharge, pain, vision change ?ENT/mouth: No nasal congestion, purulent discharge, earache, change in hearing, sore throat  ?Cardiovascular: No chest pain, palpitations, paroxysmal nocturnal dyspnea, claudication, edema  ?Respiratory: No cough, sputum production, hemoptysis, DOE, significant snoring, apnea Gastrointestinal: No heartburn, abdominal pain, nausea /vomiting, rectal bleeding, melena, change in bowels ?Genitourinary: No hematuria, pyuria, incontinence ?Dermatologic: No rash,  pruritus, change in appearance of skin ?Neurologic: No headache, syncope, seizures,  numbness, tingling ?Endocrine: No change in hair/skin/nails, excessive thirst, excessive hunger, excessive urination  ?Hematologic/lymphatic: No significant bruising, lymphadenopathy, abnormal bleeding ?Allergy/immunology: No itchy/watery eyes, significant sneezing, urticaria, angioedema ? ?Physical exam: Blood pressure sitting in the wheelchair was 110/53.  Standing with 2 person assist , pressure was 87/48.  There was no significant change in the pulse as sitting pulse was 53 and standing was 57. ?Pertinent or positive findings: Hair is disheveled and beard and mustache are unkempt.  Pattern alopecia is present.  There is faint hyperpigmentation over the right temple.  Speech is somewhat garbled.  Pupils are pinpoint.  He is wearing only the upper plate.  The left nasolabial fold is slightly decreased compared to the right.  He is edentulous.  With extraocular motion there is unsustained minor nystagmus with right lateral gaze.  The left tympanic membrane cannot be visualized due to cerumen impaction.  There is some increased wax on the right as well. The right TM was partially visualized and is dull without erythema or bulge. ?He has diffuse rhonchi in a homogenous pattern bilaterally.  Slight bradycardia is present.  He has a honking systolic murmur at the base and loudest at the apex with increased second heart sound.  Pedal pulses are decreased but palpable.  Dorsalis pedis pulses are clinically stronger than posterior tibial pulses.  He has 1/2+ edema at the upper edge of the TED hose.  Strength to opposition appears slightly stronger in the right upper extremity compared to the left.  Overall strength in the lower extremities is fair.  There is atrophy suggested of the thigh muscles.  He has isolated enlargement of the PIP joints.  He has bilateral flexion contractures of the fifth fingers.  Fingernails of RUE reveal onycholysis and discoloration as well as deformities diffusely.  The left hip is dressed.   Upon removal of the dressing there is a subcutaneous deficit with suggestion of cicatrixing type appearance.  There is a central small nodule with a central deficit which drains serous fluid. ? ?General appearance: no acute distress, increased work of breathing is present.   ?Lymphatic: No lymphadenopathy about the head, neck, axilla. ?Eyes: No conjunctival inflammation or lid edema is present. There is no scleral icterus. ?Ears:  External ear exam shows no significant lesions or deformities.   ?Nose:  External nasal examination shows no deformity or inflammation. Nasal mucosa are pink and moist without lesions, exudates ?Neck:  No thyromegaly, masses, tenderness noted.    ?Heart:  No gallop, click, rub.  ?Lungs: without wheezes,rales, rubs. ?Abdomen: Bowel sounds are normal.  Abdomen is soft and nontender with no organomegaly, hernias, masses. ?GU: Deferred  ?Extremities:  No cyanosis, clubbing. ?Neurologic exam: Balance, Rhomberg, finger to nose testing could not be completed due to clinical state ?Skin: Warm & dry w/o tenting. ?No significant rash. ? ?See clinical summary under each active problem in the Problem List with associated updated therapeutic plan ? ?

## 2021-11-19 NOTE — Assessment & Plan Note (Signed)
Admission H/H12.3/36.8.  Post IV resuscitation H/H10.2/31.  B12 level normal. ?

## 2021-11-19 NOTE — Assessment & Plan Note (Addendum)
AKI in the context of rhabdomyolysis.  Admission creatinine 1.55 with GFR 44 indicating CKD stage IIIb.  Post resuscitation with fluids; creatinine 1.21 and GFR 59 indicating CKD high stage IIIa. ?

## 2021-11-19 NOTE — Assessment & Plan Note (Addendum)
He is wearing TED hose.  Isometric exercises discussed with him.  Compliance is doubtful based on probable neurocognitive deficits. ?Observe off Flomax; therapeutic trial with midodrine 5 mg twice daily. ?

## 2021-11-19 NOTE — Patient Instructions (Signed)
See assessment and plan under each diagnosis in the problem list and acutely for this visit 

## 2021-11-19 NOTE — Assessment & Plan Note (Addendum)
His living environment and noncompliance unfortunately suggest a very poor long-term prognosis with likelihood of recurrent musculoskeletal or CNS injury due to falls.  All attempts will be made to mitigate his postural hypotension component. ?Although clinically there has been clinical improvement; prognosis is poor based on his baseline status.  Competency should be assessed. ?

## 2021-11-27 ENCOUNTER — Non-Acute Institutional Stay (SKILLED_NURSING_FACILITY): Payer: Medicare Other | Admitting: Adult Health

## 2021-11-27 ENCOUNTER — Encounter: Payer: Self-pay | Admitting: Adult Health

## 2021-11-27 DIAGNOSIS — H6123 Impacted cerumen, bilateral: Secondary | ICD-10-CM | POA: Diagnosis not present

## 2021-11-27 DIAGNOSIS — N400 Enlarged prostate without lower urinary tract symptoms: Secondary | ICD-10-CM

## 2021-11-27 DIAGNOSIS — R5381 Other malaise: Secondary | ICD-10-CM

## 2021-11-27 DIAGNOSIS — I951 Orthostatic hypotension: Secondary | ICD-10-CM

## 2021-11-27 NOTE — Progress Notes (Signed)
Location:  [facility] Room Number: 215 A Place of Service:  SNF (31) Provider:  Kenard Gower, DNP, FNP-BC  Patient Care Team: Shon Hale, MD as PCP - General (Family Medicine)  Extended Emergency Contact Information Primary Emergency Contact: Parke Simmers [place] Mobile Phone: [phone] Relation: Sister  Code Status:  FULL CODE  Goals of care: Advanced Directive information Advanced Directives 11/27/2021  Does Patient Have a Medical Advance Directive? No  Type of Advance Directive -  Copy of Healthcare Power of Attorney in Chart? -  Would patient like information on creating a medical advance directive? -     Chief Complaint  Patient presents with   Acute Visit    Short term rehab    HPI:  Pt is a 87 y.o. male seen today for short-term rehabilitation visit. Resident is currently having PT, OT and ST. He complained of feeling both ears to have earwax. Otoscopy showed bilateral ears with moderate dry earwax. SBPs ranging from 125 to 143, with outlier 154. He takes Midodrine 5 mg BID for hypotension. He is currently off Flomax due to hypotension. He is able to urinate without difficulty.  Past Medical History:  Diagnosis Date   Arthritis    L hip & R knee- OA   Cancer (HCC)    basal cell CA- face   Shortness of breath    Past Surgical History:  Procedure Laterality Date   APPENDECTOMY      [facility]- 2011   EYE SURGERY     cataracts removed, laser (bilateral)   NASAL SINUS SURGERY     [facility]- 1980's    TOTAL HIP ARTHROPLASTY  11/24/2011   Procedure: TOTAL HIP ARTHROPLASTY;  Surgeon: Loreta Ave, MD;  Location: [facility] OR;  Service: Orthopedics;  Laterality: Left;   variose     varicose vein surgery    No Known Allergies  Outpatient Encounter Medications as of 11/27/2021  Medication Sig   feeding supplement (ENSURE ENLIVE / ENSURE PLUS) LIQD Take 237 mLs by mouth 2 (two) times daily between meals.   midodrine (PROAMATINE) 5 MG tablet Take 5 mg by mouth 2 (two) times daily.   white petrolatum (VASELINE) OINT Apply 1 application. topically as needed for dry skin.   Tamsulosin HCl (FLOMAX) 0.4 MG CAPS Take 0.4 mg by mouth daily. (Patient not taking: Reported on 11/27/2021)   No facility-administered encounter medications on file as of 11/27/2021.    Review of Systems  Constitutional:  Negative for activity change, appetite change and fever.  HENT:  Negative for sore throat.        Feels bilateral ear fullness.  Eyes: Negative.   Cardiovascular:  Negative for chest pain and leg swelling.  Gastrointestinal:  Negative for abdominal distention, diarrhea and vomiting.  Genitourinary:  Negative for dysuria, frequency and urgency.  Skin:  Negative for color change.  Neurological:  Negative for dizziness and headaches.  Psychiatric/Behavioral:  Negative for behavioral problems and sleep disturbance. The patient is not nervous/anxious.       Immunization History  Administered Date(s) Administered   Influenza, High Dose Seasonal PF 05/25/2018   Pertinent  Health Maintenance Due  Topic Date Due   INFLUENZA VACCINE  04/13/2021   Fall Risk 11/15/2021 11/16/2021 11/16/2021 11/17/2021 11/17/2021  Patient Fall Risk Level High fall risk High fall risk High fall risk High fall risk High fall risk     Vitals:   11/27/21 1034  BP: 132/67  Pulse: (!) 56  Resp: 20  Temp: (!) 97.3 F (36.3 C)   Weight: 167 lb (75.8 kg)  Height: 5\' 9"  (1.753 m)   Body mass index is 24.66 kg/m.  Physical Exam Constitutional:      General: He is not in acute distress.    Appearance: Normal appearance.  HENT:     Head: Normocephalic and atraumatic.     Mouth/Throat:     Mouth: Mucous membranes are moist.  Eyes:     Conjunctiva/sclera: Conjunctivae normal.  Cardiovascular:     Rate and Rhythm: Normal rate and regular rhythm.     Pulses: Normal pulses.     Heart sounds: Normal heart sounds.  Pulmonary:     Effort: Pulmonary effort is normal.     Breath sounds: Normal breath sounds.  Abdominal:     General: Bowel sounds are normal.     Palpations: Abdomen is soft.  Musculoskeletal:        General: No swelling.     Cervical back: Normal range of motion.  Skin:    General: Skin is warm and dry.     Comments: Left hip chronic wound with dressing.  Neurological:  Mental Status: He is alert. Mental status is at baseline.  Psychiatric:        Mood and Affect: Mood normal.       Labs reviewed: Recent Labs    11/11/21 1115 11/12/21 1045 11/13/21 0213  NA 137 140 135  K 4.5 4.2 4.1  CL 102 108 106  CO2 22 24 23   GLUCOSE 91 91 100*  BUN 38* 32* 25*  CREATININE 1.55* 1.24 1.21  CALCIUM 9.1 8.6* 8.3*  MG 2.3  --   --    Recent Labs    11/11/21 1115 11/12/21 1045 11/13/21 0213  AST 110* 71* 58*  ALT 34 31 30  ALKPHOS 58 50 48  BILITOT 1.7* 0.9 0.8  PROT 7.0 6.0* 5.8*  ALBUMIN 2.8* 2.4* 2.4*   Recent Labs    11/11/21 1115 11/12/21 1045 11/13/21 0213  WBC 10.8* 8.9 7.2  NEUTROABS 8.4*  --   --   HGB 12.3* 10.9* 10.2*  HCT 36.8* 34.7* 31.0*  MCV 93.9 98.9 94.8  PLT 270 233 239   Lab Results  Component Value Date   TSH 3.939 11/11/2021   No results found for: HGBA1C No results found for: CHOL, HDL, LDLCALC, LDLDIRECT, TRIG, CHOLHDL  Significant Diagnostic Results in last 30 days:  DG Chest 1 View  Result Date: 11/11/2021 CLINICAL DATA:  Unwitnessed fall.  EXAM: CHEST  1 VIEW COMPARISON:  November 25, 2011. FINDINGS: Stable cardiomediastinal silhouette. Elevated right hemidiaphragm is noted. Both lungs are clear. The visualized skeletal structures are unremarkable. IMPRESSION: No active disease. Electronically Signed   By: Lupita Raider M.D.   On: 11/11/2021 11:59   CT Head Wo Contrast  Result Date: 11/11/2021 CLINICAL DATA:  Altered mental status EXAM: CT HEAD WITHOUT CONTRAST TECHNIQUE: Contiguous axial images were obtained from the base of the skull through the vertex without intravenous contrast. RADIATION DOSE REDUCTION: This exam was performed according to the departmental dose-optimization program which includes automated exposure control, adjustment of the mA and/or kV according to patient size and/or use of iterative reconstruction technique. COMPARISON:  None. FINDINGS: Brain: No acute intracranial findings are seen. There are no signs of bleeding. Cortical sulci are prominent. Vascular: Unremarkable. Skull: Unremarkable. Sinuses/Orbits: There is mucosal thickening in the ethmoid sinus. Other: None IMPRESSION: No acute intracranial findings are seen.  Atrophy. Chronic ethmoid sinusitis. Electronically Signed   By: Ernie Avena M.D.   On: 11/11/2021 12:06   MR BRAIN WO CONTRAST  Result Date: 11/12/2021 CLINICAL DATA:  Syncope EXAM: MRI HEAD WITHOUT CONTRAST TECHNIQUE: Multiplanar, multiecho pulse sequences of the brain and surrounding structures were obtained without intravenous contrast. COMPARISON:  None. FINDINGS: Brain: No acute infarct, mass effect or extra-axial collection. No acute or chronic hemorrhage. There is multifocal hyperintense T2-weighted signal within the white matter. Generalized volume loss without a clear lobar predilection. The midline structures are normal. Vascular: Major flow voids are preserved. Skull and upper cervical spine: Normal calvarium and skull base. Visualized upper cervical spine and soft tissues are normal.  Sinuses/Orbits:No paranasal sinus fluid levels or advanced mucosal thickening. No mastoid or middle ear effusion. Normal orbits. IMPRESSION: 1. No acute intracranial abnormality. 2. Generalized volume loss and findings of chronic small vessel ischemia. Electronically Signed   By: Deatra Robinson M.D.   On: 11/12/2021 01:16   EEG adult  Result Date: 11/12/2021 Charlsie Quest, MD     11/12/2021  3:25 PM Patient Name: Mark Howe MRN: [id number] Epilepsy Attending: Charlsie Quest  Referring Physician/Provider: Maretta Bees, MD Date: 11/12/2021 Duration: 27.03 mins Patient history: 87 year old male found down with altered mental status.  EEG to evaluate for seizure. Level of alertness: Awake, asleep AEDs during EEG study: None Technical aspects: This EEG study was done with scalp electrodes positioned according to the 10-20 International system of electrode placement. Electrical activity was acquired at a sampling rate of 500Hz  and reviewed with a high frequency filter of 70Hz  and a low frequency filter of 1Hz . EEG data were recorded continuously and digitally stored. Description: The posterior dominant rhythm consists of 7.5 Hz activity of moderate voltage (25-35 uV) seen predominantly in posterior head regions, symmetric and reactive to eye opening and eye closing. Sleep was characterized by vertex waves, sleep spindles (12 to 14 Hz), maximal frontocentral region. EEG showed intermittent generalized 5 to 6 Hz theta slowing. Hyperventilation and photic stimulation were not performed.   ABNORMALITY - Intermittent slow, generalized IMPRESSION: This study is suggestive of mild diffuse encephalopathy, nonspecific etiology. No seizures or epileptiform discharges were seen throughout the recording. Charlsie Quest   ECHOCARDIOGRAM COMPLETE  Result Date: 11/11/2021    ECHOCARDIOGRAM REPORT   Patient Name:   Mark Howe Date of Exam: 11/11/2021 Medical Rec #:  [id number]     Height:       69.0 in Accession #:     [id number]    Weight:       200.0 lb Date of Birth:  Sep 27, 1934      BSA:          2.066 m Patient Age:    86 years      BP:           179/87 mmHg Patient Gender: M             HR:           75 bpm. Exam Location:  Inpatient Procedure: 2D Echo Indications:    syncope  History:        Patient has prior history of Echocardiogram examinations, most                 recent 02/23/2021. Signs/Symptoms:Altered Mental Status; Risk                 Factors:Hypertension and Dyslipidemia.  Sonographer:    Delcie Roch RDCS Referring Phys: [id number] Orland Mustard  Sonographer Comments: Image acquisition challenging due to uncooperative patient. IMPRESSIONS  1. Left ventricular ejection fraction, by estimation, is 55 to 60%. The left ventricle has normal function. The left ventricle has no regional wall motion abnormalities. There is moderate left ventricular hypertrophy. Left ventricular diastolic parameters are consistent with Grade I diastolic dysfunction (impaired relaxation).  2. Right ventricular systolic function is normal. The right ventricular size is normal. There is moderately elevated pulmonary artery systolic pressure. The estimated right ventricular systolic pressure is 46.8 mmHg.  3. Left atrial size was mildly dilated.  4. The mitral valve is grossly normal. Mild to moderate mitral valve regurgitation.  5. The aortic valve is calcified. There is mild calcification of the aortic valve. There is mild thickening of the aortic valve. Aortic valve regurgitation is mild. FINDINGS  Left Ventricle: Left ventricular ejection fraction, by estimation, is 55 to 60%. The left ventricle has normal function. The left ventricle has no regional wall motion abnormalities. The left ventricular internal cavity size was normal in size. There is  moderate left ventricular hypertrophy. Left ventricular diastolic parameters are consistent with Grade I diastolic dysfunction (  impaired relaxation). Right Ventricle: The right ventricular  size is normal. Right vetricular wall thickness was not well visualized. Right ventricular systolic function is normal. There is moderately elevated pulmonary artery systolic pressure. The tricuspid regurgitant velocity is 3.31 m/s, and with an assumed right atrial pressure of 3 mmHg, the estimated right ventricular systolic pressure is 46.8 mmHg. Left Atrium: Left atrial size was mildly dilated. Right Atrium: Right atrial size was normal in size. Pericardium: There is no evidence of pericardial effusion. Mitral Valve: The mitral valve is grossly normal. Mild to moderate mitral valve regurgitation. Tricuspid Valve: The tricuspid valve is grossly normal. Tricuspid valve regurgitation is mild. Aortic Valve: The aortic valve is calcified. There is mild calcification of the aortic valve. There is mild thickening of the aortic valve. There is mild to moderate aortic valve annular calcification. Aortic valve regurgitation is mild. Aortic regurgitation PHT measures 547 msec. Pulmonic Valve: The pulmonic valve was normal in structure. Pulmonic valve regurgitation is trivial. Aorta: The aortic root and ascending aorta are structurally normal, with no evidence of dilitation. IAS/Shunts: The atrial septum is grossly normal.  LEFT VENTRICLE PLAX 2D LVIDd:         4.80 cm      Diastology LVIDs:         3.40 cm      LV e' medial:    5.33 cm/s LV PW:         1.20 cm      LV E/e' medial:  9.8 LV IVS:        1.40 cm      LV e' lateral:   5.33 cm/s LVOT diam:     1.90 cm      LV E/e' lateral: 9.8 LVOT Area:     2.84 cm  LV Volumes (MOD) LV vol d, MOD A4C: 116.0 ml LV vol s, MOD A4C: 62.8 ml LV SV MOD A4C:     116.0 ml RIGHT VENTRICLE             IVC RV S prime:     17.30 cm/s  IVC diam: 2.20 cm TAPSE (M-mode): 2.5 cm LEFT ATRIUM              Index        RIGHT ATRIUM           Index LA diam:        3.70 cm  1.79 cm/m   RA Area:     12.70 cm LA Vol (A2C):   118.0 ml 57.12 ml/m  RA Volume:   22.00 ml  10.65 ml/m LA Vol (A4C):    76.6 ml  37.08 ml/m LA Biplane Vol: 97.4 ml  47.15 ml/m  AORTIC VALVE AI PHT:      547 msec  AORTA Ao Root diam: 3.10 cm Ao Asc diam:  3.30 cm MITRAL VALVE                  TRICUSPID VALVE MV Area (PHT): 3.72 cm       TR Peak grad:   43.8 mmHg MV Decel Time: 204 msec       TR Vmax:        331.00 cm/s MR Peak grad:    152.3 mmHg MR Mean grad:    84.0 mmHg    SHUNTS MR Vmax:         617.00 cm/s  Systemic Diam: 1.90 cm MR Vmean:        420.0 cm/s MR  PISA:         2.26 cm MR PISA Eff ROA: 15 mm MR PISA Radius:  0.60 cm MV E velocity: 52.50 cm/s MV A velocity: 95.90 cm/s MV E/A ratio:  0.55 Kristeen Miss MD Electronically signed by Kristeen Miss MD Signature Date/Time: 11/11/2021/5:12:34 PM    Final    DG Hip Unilat W or Wo Pelvis 2-3 Views Left  Result Date: 11/11/2021 CLINICAL DATA:  Left hip pain after fall. EXAM: DG HIP (WITH OR WITHOUT PELVIS) 2-3V LEFT COMPARISON:  November 24, 2011. FINDINGS: Status post left total hip arthroplasty. No acute fracture or dislocation is noted. IMPRESSION: No acute abnormality seen. Electronically Signed   By: Lupita Raider M.D.   On: 11/11/2021 12:01    Assessment/Plan  1. Impacted cerumen of both ears -  has moderate bilateral ears earwax  -   will start Debrox otic gtts instill 6 gtts to bilateral ears BID X 4 days then flush on 5th day  2. Postural hypotension -   BPs stable, continue Midodrine -  monitor BPs  3. Benign prostatic hyperplasia without lower urinary tract symptoms -  off Flomax due to hypotension -  monitor urine output  4. Physical deconditioning -  continue PT and OT for therapeutic strengthening exercises -  fall precautions    Family/ staff Communication: Discussed plan of care with resident and charge nurse.  Labs/tests ordered:  None    Kenard Gower, DNP, MSN, FNP-BC [facility] [phone] (Monday-Friday 8:00 a.m. - 5:00 p.m.) [phone] (after hours)

## 2021-11-30 ENCOUNTER — Non-Acute Institutional Stay (SKILLED_NURSING_FACILITY): Payer: Medicare Other | Admitting: Adult Health

## 2021-11-30 ENCOUNTER — Encounter: Payer: Self-pay | Admitting: Adult Health

## 2021-11-30 DIAGNOSIS — I951 Orthostatic hypotension: Secondary | ICD-10-CM

## 2021-11-30 DIAGNOSIS — F01B0 Vascular dementia, moderate, without behavioral disturbance, psychotic disturbance, mood disturbance, and anxiety: Secondary | ICD-10-CM | POA: Diagnosis not present

## 2021-11-30 DIAGNOSIS — R5381 Other malaise: Secondary | ICD-10-CM

## 2021-11-30 NOTE — Progress Notes (Signed)
Location:  [facility] Room Number: 215-A Place of Service:  SNF (31) Provider:  Kenard Gower, DNP, FNP-BC  Patient Care Team: Shon Hale, MD as PCP - General (Family Medicine)  Extended Emergency Contact Information Primary Emergency Contact: Parke Simmers [place] Mobile Phone: [phone] Relation: Sister  Code Status:  Full Code  Goals of care: Advanced Directive information Advanced Directives 11/30/2021  Does Patient Have a Medical Advance Directive? No  Type of Advance Directive -  Copy of Healthcare Power of Attorney in Chart? -  Would patient like information on creating a medical advance directive? No - Patient declined     Chief Complaint  Patient presents with   Acute Visit    Refusal of medication     HPI:  Pt is a 87 y.o. male seen today for refusal of medication. Staff reported that he has been refusing Midodrine for his hypotension. SBPs ranging from 120 to 140. He is currently having PT and OT for physical deconditioning post hospital admission 11/11/21 to 11/17/21. He was found down on the floor at his house for several days and suffered from rhabdomyolysis and AKI. He recently scored 13/30 on SLUMS ([employer] Mental Status) examination. A score of 1-20 is in a dementia range.   Past Medical History:  Diagnosis Date   Arthritis    L hip & R knee- OA   Cancer (HCC)    basal cell CA- face   Shortness of breath    Past Surgical History:  Procedure Laterality Date   APPENDECTOMY     [facility]- 2011   EYE SURGERY     cataracts removed, laser (bilateral)   NASAL SINUS SURGERY     [facility]- [facility]'s    TOTAL HIP ARTHROPLASTY  11/24/2011   Procedure: TOTAL HIP ARTHROPLASTY;  Surgeon: Loreta Ave, MD;  Location: [facility] OR;  Service: Orthopedics;  Laterality: Left;   variose     varicose vein surgery    No Known Allergies  Outpatient Encounter Medications as of 11/30/2021  Medication Sig    bisacodyl (DULCOLAX) 10 MG suppository If not relieved by MOM, give 10 mg Bisacodyl suppositiory rectally X 1 dose in 24 hours as needed   carbamide peroxide (EAR DROPS) 6.5 % OTIC solution INSTILL 6 DROPS IN BOTH EARS TWICE A DAY FOR 4 DAYS. ON 5TH DAY FLUSH IN BOTH EARS TWICE A DAY FOR 1 DAY ONLY/IMPACTED CERUMEN   feeding supplement (ENSURE ENLIVE / ENSURE PLUS) LIQD Take 237 mLs by mouth 2 (two) times daily between meals.   Magnesium Hydroxide (MILK OF MAGNESIA PO) Take by mouth. If no BM in 3 days, give 30 cc Milk of Magnesium p.o. x 1 dose in 24 hours as needed (Do not use standing constipation orders for residents with renal failure CFR less than 30. Contact MD for orders) (Physician Order)   midodrine (PROAMATINE) 5 MG tablet Take 5 mg by mouth 2 (two) times daily.   Sodium Phosphates (RA SALINE ENEMA RE) If not relieved by Biscodyl suppository, give disposable Saline Enema rectally X 1 dose/24 hrs as needed   white petrolatum (VASELINE) OINT Apply 1 application. topically as needed for dry skin.   [DISCONTINUED] Tamsulosin HCl (FLOMAX) 0.4 MG CAPS Take 0.4 mg by mouth daily. (Patient not taking: Reported on 11/27/2021)   No facility-administered encounter medications on file as of 11/30/2021.    Review of Systems  Constitutional:  Negative for activity change, appetite change and fever.  HENT:  Negative for sore throat.   Eyes: Negative.   Cardiovascular:  Negative for chest pain and leg swelling.  Gastrointestinal:  Negative for abdominal distention, diarrhea and vomiting.  Genitourinary:  Negative for dysuria, frequency and urgency.  Skin:  Negative for color change.  Neurological:  Negative for dizziness and headaches.  Psychiatric/Behavioral:  Negative for behavioral problems and sleep disturbance. The patient is not nervous/anxious.       Immunization History  Administered Date(s) Administered   Influenza, High Dose Seasonal PF 05/25/2018   PFIZER(Purple Top)SARS-COV-2  Vaccination 09/26/2019, 10/18/2019, 06/10/2020   Tdap 04/13/2017   Pertinent  Health Maintenance Due  Topic Date Due   INFLUENZA VACCINE  12/11/2021 (Originally 04/13/2021)   Fall Risk 11/15/2021 11/16/2021 11/16/2021 11/17/2021 11/17/2021  Patient Fall Risk Level High fall risk High fall risk High fall risk High fall risk High fall risk     Vitals:   11/30/21 1552  BP: 137/66  Pulse: 69  Resp: 19  Temp: (!) 97.2 F (36.2 C)  SpO2: 100%  Weight: 167 lb (75.8 kg)  Height: 5\' 9"  (1.753 m)   Body mass index is 24.66 kg/m.  Physical Exam Constitutional:      Appearance: Normal appearance.  HENT:     Head: Normocephalic and atraumatic.     Mouth/Throat:     Mouth: Mucous membranes are moist.  Eyes:     Conjunctiva/sclera: Conjunctivae normal.  Cardiovascular:     Rate and Rhythm: Normal rate and regular rhythm.     Pulses: Normal pulses.     Heart sounds: Normal heart sounds.  Pulmonary:     Effort: Pulmonary effort is normal.     Breath sounds: Normal breath sounds.  Abdominal:     General: Bowel sounds are normal.     Palpations: Abdomen is soft.  Musculoskeletal:        General: No swelling.     Cervical back: Normal range of motion.  Skin:    General: Skin is warm and dry.  Neurological:     Mental Status: He is alert. Mental status is at baseline.  Psychiatric:        Mood and Affect: Mood normal.        Behavior: Behavior normal.       Labs reviewed: Recent Labs    11/11/21 1115 11/12/21 1045 11/13/21 0213  NA 137 140 135  K 4.5 4.2 4.1  CL 102 108 106  CO2 22 24 23   GLUCOSE 91 91 100*  BUN 38* 32* 25*  CREATININE 1.55* 1.24 1.21  CALCIUM 9.1 8.6* 8.3*  MG 2.3  --   --    Recent Labs    11/11/21 1115 11/12/21 1045 11/13/21 0213  AST 110* 71* 58*  ALT 34 31 30  ALKPHOS 58 50 48  BILITOT 1.7* 0.9 0.8  PROT 7.0 6.0* 5.8*  ALBUMIN 2.8* 2.4* 2.4*   Recent Labs    11/11/21 1115 11/12/21 1045 11/13/21 0213  WBC 10.8* 8.9 7.2  NEUTROABS 8.4*   --   --   HGB 12.3* 10.9* 10.2*  HCT 36.8* 34.7* 31.0*  MCV 93.9 98.9 94.8  PLT 270 233 239   Lab Results  Component Value Date   TSH 3.939 11/11/2021   No results found for: HGBA1C No results found for: CHOL, HDL, LDLCALC, LDLDIRECT, TRIG, CHOLHDL  Significant Diagnostic Results in last 30 days:  DG Chest 1 View  Result Date: 11/11/2021 CLINICAL DATA:  Unwitnessed fall. EXAM: CHEST  1 VIEW COMPARISON:  November 25, 2011. FINDINGS: Stable cardiomediastinal silhouette. Elevated right hemidiaphragm is noted. Both lungs are clear. The visualized skeletal structures are unremarkable. IMPRESSION: No active disease. Electronically Signed   By: Lupita Raider M.D.   On: 11/11/2021 11:59   CT Head Wo Contrast  Result Date: 11/11/2021 CLINICAL DATA:  Altered mental status EXAM: CT HEAD WITHOUT CONTRAST TECHNIQUE: Contiguous axial images were obtained from the base of the skull through the vertex without intravenous contrast. RADIATION DOSE REDUCTION: This exam was performed according to the departmental dose-optimization program which includes automated exposure control, adjustment of the mA and/or kV according to patient size and/or use of iterative reconstruction technique. COMPARISON:  None. FINDINGS: Brain: No acute intracranial findings are seen. There are no signs of bleeding. Cortical sulci are prominent. Vascular: Unremarkable. Skull: Unremarkable. Sinuses/Orbits: There is mucosal thickening in the ethmoid sinus. Other: None IMPRESSION: No acute intracranial findings are seen.  Atrophy. Chronic ethmoid sinusitis. Electronically Signed   By: Ernie Avena M.D.   On: 11/11/2021 12:06   MR BRAIN WO CONTRAST  Result Date: 11/12/2021 CLINICAL DATA:  Syncope EXAM: MRI HEAD WITHOUT CONTRAST TECHNIQUE: Multiplanar, multiecho pulse sequences of the brain and surrounding structures were obtained without intravenous contrast. COMPARISON:  None. FINDINGS: Brain: No acute infarct, mass effect or  extra-axial collection. No acute or chronic hemorrhage. There is multifocal hyperintense T2-weighted signal within the white matter. Generalized volume loss without a clear lobar predilection. The midline structures are normal. Vascular: Major flow voids are preserved. Skull and upper cervical spine: Normal calvarium and skull base. Visualized upper cervical spine and soft tissues are normal. Sinuses/Orbits:No paranasal sinus fluid levels or advanced mucosal thickening. No mastoid or middle ear effusion. Normal orbits. IMPRESSION: 1. No acute intracranial abnormality. 2. Generalized volume loss and findings of chronic small vessel ischemia. Electronically Signed   By: Deatra Robinson M.D.   On: 11/12/2021 01:16   EEG adult  Result Date: 11/12/2021 Charlsie Quest, MD     11/12/2021  3:25 PM Patient Name: Mark Howe MRN: [id number] Epilepsy Attending: Charlsie Quest Referring Physician/Provider: Maretta Bees, MD Date: 11/12/2021 Duration: 27.03 mins Patient history: 87 year old male found down with altered mental status.  EEG to evaluate for seizure. Level of alertness: Awake, asleep AEDs during EEG study: None Technical aspects: This EEG study was done with scalp electrodes positioned according to the 10-20 International system of electrode placement. Electrical activity was acquired at a sampling rate of 500Hz  and reviewed with a high frequency filter of 70Hz  and a low frequency filter of 1Hz . EEG data were recorded continuously and digitally stored. Description: The posterior dominant rhythm consists of 7.5 Hz activity of moderate voltage (25-35 uV) seen predominantly in posterior head regions, symmetric and reactive to eye opening and eye closing. Sleep was characterized by vertex waves, sleep spindles (12 to 14 Hz), maximal frontocentral region. EEG showed intermittent generalized 5 to 6 Hz theta slowing. Hyperventilation and photic stimulation were not performed.   ABNORMALITY - Intermittent slow,  generalized IMPRESSION: This study is suggestive of mild diffuse encephalopathy, nonspecific etiology. No seizures or epileptiform discharges were seen throughout the recording. Charlsie Quest   ECHOCARDIOGRAM COMPLETE  Result Date: 11/11/2021    ECHOCARDIOGRAM REPORT   Patient Name:   MOHMMED DHARIA Date of Exam: 11/11/2021 Medical Rec #:  [id number]     Height:       69.0 in Accession #:    [id number]  Weight:       200.0 lb Date of Birth:  02-20-1935      BSA:          2.066 m Patient Age:    86 years      BP:           179/87 mmHg Patient Gender: M             HR:           75 bpm. Exam Location:  Inpatient Procedure: 2D Echo Indications:    syncope  History:        Patient has prior history of Echocardiogram examinations, most                 recent 02/23/2021. Signs/Symptoms:Altered Mental Status; Risk                 Factors:Hypertension and Dyslipidemia.  Sonographer:    Delcie Roch RDCS Referring Phys: [id number] Orland Mustard  Sonographer Comments: Image acquisition challenging due to uncooperative patient. IMPRESSIONS  1. Left ventricular ejection fraction, by estimation, is 55 to 60%. The left ventricle has normal function. The left ventricle has no regional wall motion abnormalities. There is moderate left ventricular hypertrophy. Left ventricular diastolic parameters are consistent with Grade I diastolic dysfunction (impaired relaxation).  2. Right ventricular systolic function is normal. The right ventricular size is normal. There is moderately elevated pulmonary artery systolic pressure. The estimated right ventricular systolic pressure is 46.8 mmHg.  3. Left atrial size was mildly dilated.  4. The mitral valve is grossly normal. Mild to moderate mitral valve regurgitation.  5. The aortic valve is calcified. There is mild calcification of the aortic valve. There is mild thickening of the aortic valve. Aortic valve regurgitation is mild. FINDINGS  Left Ventricle: Left ventricular ejection  fraction, by estimation, is 55 to 60%. The left ventricle has normal function. The left ventricle has no regional wall motion abnormalities. The left ventricular internal cavity size was normal in size. There is  moderate left ventricular hypertrophy. Left ventricular diastolic parameters are consistent with Grade I diastolic dysfunction (impaired relaxation). Right Ventricle: The right ventricular size is normal. Right vetricular wall thickness was not well visualized. Right ventricular systolic function is normal. There is moderately elevated pulmonary artery systolic pressure. The tricuspid regurgitant velocity is 3.31 m/s, and with an assumed right atrial pressure of 3 mmHg, the estimated right ventricular systolic pressure is 46.8 mmHg. Left Atrium: Left atrial size was mildly dilated. Right Atrium: Right atrial size was normal in size. Pericardium: There is no evidence of pericardial effusion. Mitral Valve: The mitral valve is grossly normal. Mild to moderate mitral valve regurgitation. Tricuspid Valve: The tricuspid valve is grossly normal. Tricuspid valve regurgitation is mild. Aortic Valve: The aortic valve is calcified. There is mild calcification of the aortic valve. There is mild thickening of the aortic valve. There is mild to moderate aortic valve annular calcification. Aortic valve regurgitation is mild. Aortic regurgitation PHT measures 547 msec. Pulmonic Valve: The pulmonic valve was normal in structure. Pulmonic valve regurgitation is trivial. Aorta: The aortic root and ascending aorta are structurally normal, with no evidence of dilitation. IAS/Shunts: The atrial septum is grossly normal.  LEFT VENTRICLE PLAX 2D LVIDd:         4.80 cm      Diastology LVIDs:         3.40 cm      LV e' medial:    5.33 cm/s LV PW:  1.20 cm      LV E/e' medial:  9.8 LV IVS:        1.40 cm      LV e' lateral:   5.33 cm/s LVOT diam:     1.90 cm      LV E/e' lateral: 9.8 LVOT Area:     2.84 cm  LV Volumes (MOD)  LV vol d, MOD A4C: 116.0 ml LV vol s, MOD A4C: 62.8 ml LV SV MOD A4C:     116.0 ml RIGHT VENTRICLE             IVC RV S prime:     17.30 cm/s  IVC diam: 2.20 cm TAPSE (M-mode): 2.5 cm LEFT ATRIUM              Index        RIGHT ATRIUM           Index LA diam:        3.70 cm  1.79 cm/m   RA Area:     12.70 cm LA Vol (A2C):   118.0 ml 57.12 ml/m  RA Volume:   22.00 ml  10.65 ml/m LA Vol (A4C):   76.6 ml  37.08 ml/m LA Biplane Vol: 97.4 ml  47.15 ml/m  AORTIC VALVE AI PHT:      547 msec  AORTA Ao Root diam: 3.10 cm Ao Asc diam:  3.30 cm MITRAL VALVE                  TRICUSPID VALVE MV Area (PHT): 3.72 cm       TR Peak grad:   43.8 mmHg MV Decel Time: 204 msec       TR Vmax:        331.00 cm/s MR Peak grad:    152.3 mmHg MR Mean grad:    84.0 mmHg    SHUNTS MR Vmax:         617.00 cm/s  Systemic Diam: 1.90 cm MR Vmean:        420.0 cm/s MR PISA:         2.26 cm MR PISA Eff ROA: 15 mm MR PISA Radius:  0.60 cm MV E velocity: 52.50 cm/s MV A velocity: 95.90 cm/s MV E/A ratio:  0.55 Kristeen Miss MD Electronically signed by Kristeen Miss MD Signature Date/Time: 11/11/2021/5:12:34 PM    Final    DG Hip Unilat W or Wo Pelvis 2-3 Views Left  Result Date: 11/11/2021 CLINICAL DATA:  Left hip pain after fall. EXAM: DG HIP (WITH OR WITHOUT PELVIS) 2-3V LEFT COMPARISON:  November 24, 2011. FINDINGS: Status post left total hip arthroplasty. No acute fracture or dislocation is noted. IMPRESSION: No acute abnormality seen. Electronically Signed   By: Lupita Raider M.D.   On: 11/11/2021 12:01    Assessment/Plan  1. Postural hypotension -  BPs within normal range, will change Midodrine 5 mg 1 tab BID to PRN  2. Physical deconditioning -   continue PT and OT for therapeutic strengthening exercises  3. Moderate vascular dementia without behavioral disturbance, psychotic disturbance, mood disturbance, or anxiety -  continue ST for cognitive skills    Family/ staff Communication: Discussed plan of care with resident  and charge nurse.  Labs/tests ordered: None    Kenard Gower, DNP, MSN, FNP-BC [facility] [phone] (Monday-Friday 8:00 a.m. - 5:00 p.m.) [phone] (after hours)

## 2021-12-01 ENCOUNTER — Encounter: Payer: Self-pay | Admitting: Internal Medicine

## 2021-12-09 ENCOUNTER — Encounter: Payer: Self-pay | Admitting: Adult Health

## 2021-12-09 ENCOUNTER — Non-Acute Institutional Stay (SKILLED_NURSING_FACILITY): Payer: Medicare Other | Admitting: Adult Health

## 2021-12-09 DIAGNOSIS — F01B2 Vascular dementia, moderate, with psychotic disturbance: Secondary | ICD-10-CM

## 2021-12-09 DIAGNOSIS — F29 Unspecified psychosis not due to a substance or known physiological condition: Secondary | ICD-10-CM

## 2021-12-09 DIAGNOSIS — N400 Enlarged prostate without lower urinary tract symptoms: Secondary | ICD-10-CM

## 2021-12-09 NOTE — Progress Notes (Signed)
? ?Location:  [facility] Room Number: 622 A ?Place of Service:  SNF (31) ?Provider:  Durenda Age, DNP, FNP-BC ? ?Patient Care Team: ?[name], MD as PCP - General (Family Medicine) ? ?Extended Emergency Contact Information ?Primary Emergency Contact: Gregory,Judy ? [place] ?Mobile Phone: [phone] ?Relation: Sister ? ?Code Status:  Full Code ? ?Goals of care: Advanced Directive information ? ?  12/09/2021  ?  3:59 PM  ?Advanced Directives  ?Does Patient Have a Medical Advance Directive? No  ?Would patient like information on creating a medical advance directive? No - Patient declined  ? ? ? ?Chief Complaint  ?Patient presents with  ? Acute Visit  ?  agitation/confusion   ? ? ?HPI:  ?Pt is a 87 y.o. male  who was noted to have agitation/argumentative. Discussed his diagnosis of having vascular dementia. SLUMS ([place] mental status) examination score was 13/30, ranging in dementia.  MRI of the brain done on 11/11/21 showed generalized volume loss and findings of chronic small vessel ischemia.  He argued about not having dementia and that he cannot function on his own at home.  Sister has been taking care of his financial affairs.  Sister reported that he sits on his dirty clothes while on the wheelchair and uses them again. Staff reported that he wouldn't let them help him with his clothing.  ? ? ?Past Medical History:  ?Diagnosis Date  ? Arthritis   ? L hip & R knee- OA  ? Cancer [facility])   ? basal cell CA- face  ? Shortness of breath   ? ?Past Surgical History:  ?Procedure Laterality Date  ? APPENDECTOMY    ? [facility]- 2011  ? EYE SURGERY    ? cataracts removed, laser (bilateral)  ? NASAL SINUS SURGERY    ? [facility]- 1980's   ? TOTAL HIP ARTHROPLASTY  11/24/2011  ? Procedure: TOTAL HIP ARTHROPLASTY;  Surgeon: Ninetta Lights, MD;  Location: [place];  Service: Orthopedics;  Laterality: Left;  ? variose    ? varicose vein surgery  ? ? ?No Known  Allergies ? ?Outpatient Encounter Medications as of 12/09/2021  ?Medication Sig  ? bisacodyl (DULCOLAX) 10 MG suppository If not relieved by MOM, give 10 mg Bisacodyl suppositiory rectally X 1 dose in 24 hours as needed  ? carbamide peroxide (EAR DROPS) 6.5 % OTIC solution INSTILL 6 DROPS IN BOTH EARS TWICE A DAY FOR 4 DAYS. ON 5TH DAY FLUSH IN BOTH EARS TWICE A DAY FOR 1 DAY ONLY/IMPACTED CERUMEN  ? feeding supplement (ENSURE ENLIVE / ENSURE PLUS) LIQD Take 237 mLs by mouth 2 (two) times daily between meals.  ? Magnesium Hydroxide (MILK OF MAGNESIA PO) Take by mouth. If no BM in 3 days, give 30 cc Milk of Magnesium p.o. x 1 dose in 24 hours as needed (Do not use standing constipation orders for residents with renal failure CFR less than 30. Contact MD for orders) (Physician Order)  ? midodrine (PROAMATINE) 5 MG tablet Take 5 mg by mouth 2 (two) times daily.  ? NON FORMULARY Magic Cup twice daily  ? Sodium Phosphates (RA SALINE ENEMA RE) If not relieved by Biscodyl suppository, give disposable Saline Enema rectally X 1 dose/24 hrs as needed  ? white petrolatum (VASELINE) OINT Apply 1 application. topically as needed for dry skin.  ? ?No facility-administered encounter medications on file as of 12/09/2021.  ? ? ?Review of Systems  ?Constitutional:  Negative for activity change, appetite  change and fever.  ?HENT:  Negative for sore throat.   ?Eyes: Negative.   ?Cardiovascular:  Negative for chest pain and leg swelling.  ?Gastrointestinal:  Negative for abdominal distention, diarrhea and vomiting.  ?Genitourinary:  Negative for dysuria, frequency and urgency.  ?Skin:  Negative for color change.  ?Neurological:  Negative for dizziness and headaches.  ?Psychiatric/Behavioral:  Positive for agitation. Negative for behavioral problems and sleep disturbance. The patient is not nervous/anxious.    ? ? ? ?Immunization History  ?Administered Date(s) Administered  ? Influenza Split 09/28/2004, 06/25/2013, 05/12/2016, 06/30/2017,  07/01/2017, 05/25/2018, 09/02/2018, 06/05/2019, 09/05/2020  ? Influenza, High Dose Seasonal PF 05/25/2018  ? Influenza,inj,quad, With Preservative 06/20/2014, 06/23/2015  ? Influenza-Unspecified 07/04/2021  ? PFIZER(Purple Top)SARS-COV-2 Vaccination 09/26/2019, 10/18/2019, 06/10/2020  ? Pneumococcal Conjugate-13 06/20/2014  ? Pneumococcal Polysaccharide-23 04/18/2006  ? Td 03/05/2002  ? Tdap 04/13/2017  ? Zoster, Live 10/08/2009  ? ?Pertinent  Health Maintenance Due  ?Topic Date Due  ? INFLUENZA VACCINE  Completed  ? ? ?  11/15/2021  ?  8:00 PM 11/16/2021  ?  8:00 AM 11/16/2021  ?  8:00 PM 11/17/2021  ?  9:05 AM 11/17/2021  ?  7:00 PM  ?Fall Risk  ?Patient Fall Risk Level High fall risk High fall risk High fall risk High fall risk High fall risk  ? ? ? ?Vitals:  ? 12/09/21 1544  ?BP: 126/72  ?Pulse: (!) 56  ?Resp: 19  ?Temp: (!) 97.3 ?F (36.3 ?C)  ?SpO2: 100%  ?Weight: 166 lb 6.4 oz (75.5 kg)  ?Height: '5\' 9"'$  (1.753 m)  ? ?Body mass index is 24.57 kg/m?. ? ?Physical Exam ?Constitutional:   ?   General: He is not in acute distress. ?   Appearance: Normal appearance.  ?HENT:  ?   Head: Normocephalic and atraumatic.  ?   Mouth/Throat:  ?   Mouth: Mucous membranes are moist.  ?Eyes:  ?   Conjunctiva/sclera: Conjunctivae normal.  ?Cardiovascular:  ?   Rate and Rhythm: Normal rate and regular rhythm.  ?   Pulses: Normal pulses.  ?   Heart sounds: Normal heart sounds.  ?Pulmonary:  ?   Effort: Pulmonary effort is normal.  ?   Breath sounds: Normal breath sounds.  ?Abdominal:  ?   General: Bowel sounds are normal.  ?   Palpations: Abdomen is soft.  ?Musculoskeletal:     ?   General: No swelling.  ?   Cervical back: Normal range of motion.  ?Skin: ?   General: Skin is warm and dry.  ?Neurological:  ?   General: No focal deficit present.  ?   Mental Status: He is alert. Mental status is at baseline. He is disoriented.  ?Psychiatric:     ?   Mood and Affect: Mood normal.  ?   Comments: Agitated ?  ?  ? ? ? ?Labs reviewed: ?Recent Labs  ?   11/11/21 ?1115 11/12/21 ?1045 11/13/21 ?0213  ?NA 137 140 135  ?K 4.5 4.2 4.1  ?CL 102 108 106  ?CO2 '22 24 23  '$ ?GLUCOSE 91 91 100*  ?BUN 38* 32* 25*  ?CREATININE 1.55* 1.24 1.21  ?CALCIUM 9.1 8.6* 8.3*  ?MG 2.3  --   --   ? ?Recent Labs  ?  11/11/21 ?1115 11/12/21 ?1045 11/13/21 ?0213  ?AST 110* 71* 58*  ?ALT 34 31 30  ?ALKPHOS 58 50 48  ?BILITOT 1.7* 0.9 0.8  ?PROT 7.0 6.0* 5.8*  ?ALBUMIN 2.8* 2.4* 2.4*  ? ?Recent  Labs  ?  11/11/21 ?1115 11/12/21 ?1045 11/13/21 ?0213  ?WBC 10.8* 8.9 7.2  ?NEUTROABS 8.4*  --   --   ?HGB 12.3* 10.9* 10.2*  ?HCT 36.8* 34.7* 31.0*  ?MCV 93.9 98.9 94.8  ?PLT 270 233 239  ? ?Lab Results  ?Component Value Date  ? TSH 3.939 11/11/2021  ? ?No results found for: HGBA1C ?No results found for: CHOL, HDL, LDLCALC, LDLDIRECT, TRIG, CHOLHDL ? ?Significant Diagnostic Results in last 30 days:  ?DG Chest 1 View ? ?Result Date: 11/11/2021 ?CLINICAL DATA:  Unwitnessed fall. EXAM: CHEST  1 VIEW COMPARISON:  November 25, 2011. FINDINGS: Stable cardiomediastinal silhouette. Elevated right hemidiaphragm is noted. Both lungs are clear. The visualized skeletal structures are unremarkable. IMPRESSION: No active disease. Electronically Signed   By: Marijo Conception M.D.   On: 11/11/2021 11:59  ? ?CT Head Wo Contrast ? ?Result Date: 11/11/2021 ?CLINICAL DATA:  Altered mental status EXAM: CT HEAD WITHOUT CONTRAST TECHNIQUE: Contiguous axial images were obtained from the base of the skull through the vertex without intravenous contrast. RADIATION DOSE REDUCTION: This exam was performed according to the departmental dose-optimization program which includes automated exposure control, adjustment of the mA and/or kV according to patient size and/or use of iterative reconstruction technique. COMPARISON:  None. FINDINGS: Brain: No acute intracranial findings are seen. There are no signs of bleeding. Cortical sulci are prominent. Vascular: Unremarkable. Skull: Unremarkable. Sinuses/Orbits: There is mucosal thickening in the  ethmoid sinus. Other: None IMPRESSION: No acute intracranial findings are seen.  Atrophy. Chronic ethmoid sinusitis. Electronically Signed   By: Elmer Picker M.D.   On: 11/11/2021 12:06  ? ?MR BRAIN WO CON

## 2021-12-11 ENCOUNTER — Encounter: Payer: Self-pay | Admitting: Adult Health

## 2021-12-11 ENCOUNTER — Non-Acute Institutional Stay (SKILLED_NURSING_FACILITY): Payer: Medicare Other | Admitting: Adult Health

## 2021-12-11 DIAGNOSIS — G8929 Other chronic pain: Secondary | ICD-10-CM

## 2021-12-11 DIAGNOSIS — M545 Low back pain, unspecified: Secondary | ICD-10-CM | POA: Diagnosis not present

## 2021-12-11 DIAGNOSIS — F29 Unspecified psychosis not due to a substance or known physiological condition: Secondary | ICD-10-CM

## 2021-12-11 DIAGNOSIS — F01B2 Vascular dementia, moderate, with psychotic disturbance: Secondary | ICD-10-CM

## 2021-12-11 NOTE — Progress Notes (Signed)
? ?Location:  [facility] ?Nursing Home Room Number: 215-A ?Place of Service:  SNF (31) ?Provider:  Durenda Age, DNP, FNP-BC ? ?Patient Care Team: ?[name], MD as PCP - General (Family Medicine) ? ?Extended Emergency Contact Information ?Primary Emergency Contact: Gregory,Judy ? [place] ?Mobile Phone: [phone] ?Relation: Sister ? ?Code Status:  Full Code ? ?Goals of care: Advanced Directive information ? ?  12/11/2021  ?  2:59 PM  ?Advanced Directives  ?Does Patient Have a Medical Advance Directive? No  ?Would patient like information on creating a medical advance directive? No - Patient declined  ? ? ? ?Chief Complaint  ?Patient presents with  ? Acute Visit  ?  Lower back pain  ? ? ?HPI:  ?Pt is a 87 y.o. male seen today for an acute visit regarding lower back pain. He is currently having PT, OT and ST. He was admitted to [facility]. He has a chronic wound on his left hip. X-ray of left hip showed no acute abnormality. Sister reported that he gets so agitated and argumentative when she visits him. He was recently started on Seroquel 12.5 mg daily for psychosis. ? ? ?Past Medical History:  ?Diagnosis Date  ? Arthritis   ? L hip & R knee- OA  ? Cancer [facility])   ? basal cell CA- face  ? Shortness of breath   ? ?Past Surgical History:  ?Procedure Laterality Date  ? APPENDECTOMY    ? [facility]- 2011  ? EYE SURGERY    ? cataracts removed, laser (bilateral)  ? NASAL SINUS SURGERY    ? [facility]- 1980's   ? TOTAL HIP ARTHROPLASTY  11/24/2011  ? Procedure: TOTAL HIP ARTHROPLASTY;  Surgeon: Ninetta Lights, MD;  Location: [facility];  Service: Orthopedics;  Laterality: Left;  ? variose    ? varicose vein surgery  ? ? ?No Known Allergies ? ?Outpatient Encounter Medications as of 12/11/2021  ?Medication Sig  ? bisacodyl (DULCOLAX) 10 MG suppository If not relieved by MOM, give 10 mg Bisacodyl suppositiory rectally X 1 dose in 24 hours as needed  ? carbamide peroxide (EAR  DROPS) 6.5 % OTIC solution INSTILL 6 DROPS IN BOTH EARS TWICE A DAY FOR 4 DAYS. ON 5TH DAY FLUSH IN BOTH EARS TWICE A DAY FOR 1 DAY ONLY/IMPACTED CERUMEN  ? donepezil (ARICEPT) 5 MG tablet Take 5 mg by mouth at bedtime.  ? feeding supplement (ENSURE ENLIVE / ENSURE PLUS) LIQD Take 237 mLs by mouth 2 (two) times daily between meals.  ? Magnesium Hydroxide (MILK OF MAGNESIA PO) Take by mouth. If no BM in 3 days, give 30 cc Milk of Magnesium p.o. x 1 dose in 24 hours as needed (Do not use standing constipation orders for residents with renal failure CFR less than 30. Contact MD for orders) (Physician Order)  ? midodrine (PROAMATINE) 5 MG tablet Take 5 mg by mouth 2 (two) times daily.  ? NON FORMULARY Diet:Mechanical Soft double portions ?Magic Cup twice daily  ? QUEtiapine (SEROQUEL) 25 MG tablet Take 12.5 mg by mouth daily. for psychosis  ? Sodium Phosphates (RA SALINE ENEMA RE) If not relieved by Biscodyl suppository, give disposable Saline Enema rectally X 1 dose/24 hrs as needed  ? tamsulosin (FLOMAX) 0.4 MG CAPS capsule Take 0.4 mg by mouth daily.  ? white petrolatum (VASELINE) OINT Apply 1 application. topically as needed for dry skin.  ? ?No facility-administered encounter medications on file as of 12/11/2021.  ? ? ?Review  of Systems  ?Constitutional:  Negative for activity change, appetite change and fever.  ?HENT:  Negative for sore throat.   ?Eyes: Negative.   ?Cardiovascular:  Negative for chest pain and leg swelling.  ?Gastrointestinal:  Negative for abdominal distention, diarrhea and vomiting.  ?Genitourinary:  Negative for dysuria, frequency and urgency.  ?Musculoskeletal:  Positive for back pain.  ?Skin:  Positive for wound. Negative for color change.  ?Neurological:  Negative for dizziness and headaches.  ?Psychiatric/Behavioral:  Positive for agitation and behavioral problems. Negative for sleep disturbance. The patient is not nervous/anxious.    ? ? ? ?Immunization History  ?Administered Date(s)  Administered  ? Influenza Split 09/28/2004, 06/25/2013, 05/12/2016, 06/30/2017, 07/01/2017, 05/25/2018, 09/02/2018, 06/05/2019, 09/05/2020  ? Influenza, High Dose Seasonal PF 05/25/2018  ? Influenza,inj,quad, With Preservative 06/20/2014, 06/23/2015  ? Influenza-Unspecified 07/04/2021  ? PFIZER(Purple Top)SARS-COV-2 Vaccination 09/26/2019, 10/18/2019, 06/10/2020  ? Pneumococcal Conjugate-13 06/20/2014  ? Pneumococcal Polysaccharide-23 04/18/2006  ? Td 03/05/2002  ? Tdap 04/13/2017  ? Zoster, Live 10/08/2009  ? ?Pertinent  Health Maintenance Due  ?Topic Date Due  ? INFLUENZA VACCINE  Completed  ? ? ?  11/15/2021  ?  8:00 PM 11/16/2021  ?  8:00 AM 11/16/2021  ?  8:00 PM 11/17/2021  ?  9:05 AM 11/17/2021  ?  7:00 PM  ?Fall Risk  ?Patient Fall Risk Level High fall risk High fall risk High fall risk High fall risk High fall risk  ? ? ? ?Vitals:  ? 12/11/21 1453  ?BP: 110/60  ?Pulse: (!) 58  ?Resp: 18  ?Temp: (!) 97.3 ?F (36.3 ?C)  ?SpO2: 100%  ?Weight: 166 lb 6.4 oz (75.5 kg)  ?Height: '5\' 9"'$  (1.753 m)  ? ?Body mass index is 24.57 kg/m?. ? ?Physical Exam ?Constitutional:   ?   General: He is not in acute distress. ?   Appearance: Normal appearance.  ?HENT:  ?   Head: Normocephalic and atraumatic.  ?   Mouth/Throat:  ?   Mouth: Mucous membranes are moist.  ?Eyes:  ?   Conjunctiva/sclera: Conjunctivae normal.  ?Cardiovascular:  ?   Rate and Rhythm: Normal rate and regular rhythm.  ?   Pulses: Normal pulses.  ?   Heart sounds: Normal heart sounds.  ?Pulmonary:  ?   Effort: Pulmonary effort is normal.  ?   Breath sounds: Normal breath sounds.  ?Abdominal:  ?   General: Bowel sounds are normal.  ?   Palpations: Abdomen is soft.  ?Musculoskeletal:     ?   General: No swelling. Normal range of motion.  ?   Cervical back: Normal range of motion.  ?Skin: ?   General: Skin is warm and dry.  ?   Comments: Left hip wound is dry, no erythema.  ?Neurological:  ?   Mental Status: He is alert and oriented to person, place, and time. Mental status  is at baseline.  ?Psychiatric:     ?   Mood and Affect: Mood normal.     ?   Thought Content: Thought content normal.  ?   Comments: Agitated  ?  ? ?Labs reviewed: ?Recent Labs  ?  11/11/21 ?1115 11/12/21 ?1045 11/13/21 ?0213  ?NA 137 140 135  ?K 4.5 4.2 4.1  ?CL 102 108 106  ?CO2 '22 24 23  '$ ?GLUCOSE 91 91 100*  ?BUN 38* 32* 25*  ?CREATININE 1.55* 1.24 1.21  ?CALCIUM 9.1 8.6* 8.3*  ?MG 2.3  --   --   ? ?Recent Labs  ?  11/11/21 ?1115 11/12/21 ?1045 11/13/21 ?0213  ?AST 110* 71* 58*  ?ALT 34 31 30  ?ALKPHOS 58 50 48  ?BILITOT 1.7* 0.9 0.8  ?PROT 7.0 6.0* 5.8*  ?ALBUMIN 2.8* 2.4* 2.4*  ? ?Recent Labs  ?  11/11/21 ?1115 11/12/21 ?1045 11/13/21 ?0213  ?WBC 10.8* 8.9 7.2  ?NEUTROABS 8.4*  --   --   ?HGB 12.3* 10.9* 10.2*  ?HCT 36.8* 34.7* 31.0*  ?MCV 93.9 98.9 94.8  ?PLT 270 233 239  ? ?Lab Results  ?Component Value Date  ? TSH 3.939 11/11/2021  ? ?No results found for: HGBA1C ?No results found for: CHOL, HDL, LDLCALC, LDLDIRECT, TRIG, CHOLHDL ? ?Significant Diagnostic Results in last 30 days:  ?MR BRAIN WO CONTRAST ? ?Result Date: 11/12/2021 ?CLINICAL DATA:  Syncope EXAM: MRI HEAD WITHOUT CONTRAST TECHNIQUE: Multiplanar, multiecho pulse sequences of the brain and surrounding structures were obtained without intravenous contrast. COMPARISON:  None. FINDINGS: Brain: No acute infarct, mass effect or extra-axial collection. No acute or chronic hemorrhage. There is multifocal hyperintense T2-weighted signal within the white matter. Generalized volume loss without a clear lobar predilection. The midline structures are normal. Vascular: Major flow voids are preserved. Skull and upper cervical spine: Normal calvarium and skull base. Visualized upper cervical spine and soft tissues are normal. Sinuses/Orbits:No paranasal sinus fluid levels or advanced mucosal thickening. No mastoid or middle ear effusion. Normal orbits. IMPRESSION: 1. No acute intracranial abnormality. 2. Generalized volume loss and findings of chronic small vessel  ischemia. Electronically Signed   By: Ulyses Jarred M.D.   On: 11/12/2021 01:16  ? ?EEG adult ? ?Result Date: 11/12/2021 ?Lora Havens, MD     11/12/2021  3:25 PM Patient Name: Mark Howe MRN: [id number]

## 2021-12-14 DIAGNOSIS — M6282 Rhabdomyolysis: Secondary | ICD-10-CM | POA: Diagnosis not present

## 2021-12-14 DIAGNOSIS — R2681 Unsteadiness on feet: Secondary | ICD-10-CM | POA: Diagnosis not present

## 2021-12-14 DIAGNOSIS — Z741 Need for assistance with personal care: Secondary | ICD-10-CM | POA: Diagnosis not present

## 2021-12-14 DIAGNOSIS — M6259 Muscle wasting and atrophy, not elsewhere classified, multiple sites: Secondary | ICD-10-CM | POA: Diagnosis not present

## 2021-12-14 DIAGNOSIS — M6281 Muscle weakness (generalized): Secondary | ICD-10-CM | POA: Diagnosis not present

## 2021-12-15 DIAGNOSIS — M6259 Muscle wasting and atrophy, not elsewhere classified, multiple sites: Secondary | ICD-10-CM | POA: Diagnosis not present

## 2021-12-15 DIAGNOSIS — R2681 Unsteadiness on feet: Secondary | ICD-10-CM | POA: Diagnosis not present

## 2021-12-15 DIAGNOSIS — I10 Essential (primary) hypertension: Secondary | ICD-10-CM | POA: Diagnosis not present

## 2021-12-15 DIAGNOSIS — Z741 Need for assistance with personal care: Secondary | ICD-10-CM | POA: Diagnosis not present

## 2021-12-15 DIAGNOSIS — M6281 Muscle weakness (generalized): Secondary | ICD-10-CM | POA: Diagnosis not present

## 2021-12-15 DIAGNOSIS — M6282 Rhabdomyolysis: Secondary | ICD-10-CM | POA: Diagnosis not present

## 2021-12-15 LAB — BASIC METABOLIC PANEL
BUN: 28 — AB (ref 4–21)
CO2: 22 (ref 13–22)
Chloride: 99 (ref 99–108)
Creatinine: 1 (ref 0.6–1.3)
Glucose: 93
Potassium: 5.1 mEq/L (ref 3.5–5.1)
Sodium: 135 — AB (ref 137–147)

## 2021-12-15 LAB — CBC AND DIFFERENTIAL
HCT: 32 — AB (ref 41–53)
Hemoglobin: 10.8 — AB (ref 13.5–17.5)
Platelets: 213 10*3/uL (ref 150–400)
WBC: 6.8

## 2021-12-15 LAB — COMPREHENSIVE METABOLIC PANEL: Calcium: 9.2 (ref 8.7–10.7)

## 2021-12-15 LAB — CBC: RBC: 3.41 — AB (ref 3.87–5.11)

## 2021-12-16 DIAGNOSIS — Z741 Need for assistance with personal care: Secondary | ICD-10-CM | POA: Diagnosis not present

## 2021-12-16 DIAGNOSIS — M6259 Muscle wasting and atrophy, not elsewhere classified, multiple sites: Secondary | ICD-10-CM | POA: Diagnosis not present

## 2021-12-16 DIAGNOSIS — M6282 Rhabdomyolysis: Secondary | ICD-10-CM | POA: Diagnosis not present

## 2021-12-16 DIAGNOSIS — M6281 Muscle weakness (generalized): Secondary | ICD-10-CM | POA: Diagnosis not present

## 2021-12-16 DIAGNOSIS — R2681 Unsteadiness on feet: Secondary | ICD-10-CM | POA: Diagnosis not present

## 2021-12-17 DIAGNOSIS — M6281 Muscle weakness (generalized): Secondary | ICD-10-CM | POA: Diagnosis not present

## 2021-12-17 DIAGNOSIS — M6282 Rhabdomyolysis: Secondary | ICD-10-CM | POA: Diagnosis not present

## 2021-12-17 DIAGNOSIS — Z741 Need for assistance with personal care: Secondary | ICD-10-CM | POA: Diagnosis not present

## 2021-12-17 DIAGNOSIS — M6259 Muscle wasting and atrophy, not elsewhere classified, multiple sites: Secondary | ICD-10-CM | POA: Diagnosis not present

## 2021-12-17 DIAGNOSIS — R2681 Unsteadiness on feet: Secondary | ICD-10-CM | POA: Diagnosis not present

## 2021-12-18 DIAGNOSIS — R2681 Unsteadiness on feet: Secondary | ICD-10-CM | POA: Diagnosis not present

## 2021-12-18 DIAGNOSIS — M6282 Rhabdomyolysis: Secondary | ICD-10-CM | POA: Diagnosis not present

## 2021-12-18 DIAGNOSIS — Z741 Need for assistance with personal care: Secondary | ICD-10-CM | POA: Diagnosis not present

## 2021-12-18 DIAGNOSIS — M6281 Muscle weakness (generalized): Secondary | ICD-10-CM | POA: Diagnosis not present

## 2021-12-18 DIAGNOSIS — M6259 Muscle wasting and atrophy, not elsewhere classified, multiple sites: Secondary | ICD-10-CM | POA: Diagnosis not present

## 2021-12-21 DIAGNOSIS — R2681 Unsteadiness on feet: Secondary | ICD-10-CM | POA: Diagnosis not present

## 2021-12-21 DIAGNOSIS — Z741 Need for assistance with personal care: Secondary | ICD-10-CM | POA: Diagnosis not present

## 2021-12-21 DIAGNOSIS — M6282 Rhabdomyolysis: Secondary | ICD-10-CM | POA: Diagnosis not present

## 2021-12-21 DIAGNOSIS — M6281 Muscle weakness (generalized): Secondary | ICD-10-CM | POA: Diagnosis not present

## 2021-12-21 DIAGNOSIS — M6259 Muscle wasting and atrophy, not elsewhere classified, multiple sites: Secondary | ICD-10-CM | POA: Diagnosis not present

## 2021-12-22 DIAGNOSIS — R2681 Unsteadiness on feet: Secondary | ICD-10-CM | POA: Diagnosis not present

## 2021-12-22 DIAGNOSIS — M6282 Rhabdomyolysis: Secondary | ICD-10-CM | POA: Diagnosis not present

## 2021-12-22 DIAGNOSIS — M6281 Muscle weakness (generalized): Secondary | ICD-10-CM | POA: Diagnosis not present

## 2021-12-22 DIAGNOSIS — M6259 Muscle wasting and atrophy, not elsewhere classified, multiple sites: Secondary | ICD-10-CM | POA: Diagnosis not present

## 2021-12-22 DIAGNOSIS — Z741 Need for assistance with personal care: Secondary | ICD-10-CM | POA: Diagnosis not present

## 2021-12-23 DIAGNOSIS — F03B0 Unspecified dementia, moderate, without behavioral disturbance, psychotic disturbance, mood disturbance, and anxiety: Secondary | ICD-10-CM | POA: Diagnosis not present

## 2021-12-23 DIAGNOSIS — Z741 Need for assistance with personal care: Secondary | ICD-10-CM | POA: Diagnosis not present

## 2021-12-23 DIAGNOSIS — M6259 Muscle wasting and atrophy, not elsewhere classified, multiple sites: Secondary | ICD-10-CM | POA: Diagnosis not present

## 2021-12-23 DIAGNOSIS — F413 Other mixed anxiety disorders: Secondary | ICD-10-CM | POA: Diagnosis not present

## 2021-12-23 DIAGNOSIS — I10 Essential (primary) hypertension: Secondary | ICD-10-CM | POA: Diagnosis not present

## 2021-12-23 DIAGNOSIS — M6282 Rhabdomyolysis: Secondary | ICD-10-CM | POA: Diagnosis not present

## 2021-12-23 DIAGNOSIS — R2681 Unsteadiness on feet: Secondary | ICD-10-CM | POA: Diagnosis not present

## 2021-12-23 DIAGNOSIS — M6281 Muscle weakness (generalized): Secondary | ICD-10-CM | POA: Diagnosis not present

## 2021-12-24 DIAGNOSIS — M6259 Muscle wasting and atrophy, not elsewhere classified, multiple sites: Secondary | ICD-10-CM | POA: Diagnosis not present

## 2021-12-24 DIAGNOSIS — R2681 Unsteadiness on feet: Secondary | ICD-10-CM | POA: Diagnosis not present

## 2021-12-24 DIAGNOSIS — M6282 Rhabdomyolysis: Secondary | ICD-10-CM | POA: Diagnosis not present

## 2021-12-24 DIAGNOSIS — M6281 Muscle weakness (generalized): Secondary | ICD-10-CM | POA: Diagnosis not present

## 2021-12-24 DIAGNOSIS — Z741 Need for assistance with personal care: Secondary | ICD-10-CM | POA: Diagnosis not present

## 2021-12-25 DIAGNOSIS — M6259 Muscle wasting and atrophy, not elsewhere classified, multiple sites: Secondary | ICD-10-CM | POA: Diagnosis not present

## 2021-12-25 DIAGNOSIS — R2681 Unsteadiness on feet: Secondary | ICD-10-CM | POA: Diagnosis not present

## 2021-12-25 DIAGNOSIS — I10 Essential (primary) hypertension: Secondary | ICD-10-CM | POA: Diagnosis not present

## 2021-12-25 DIAGNOSIS — Z741 Need for assistance with personal care: Secondary | ICD-10-CM | POA: Diagnosis not present

## 2021-12-25 DIAGNOSIS — M6282 Rhabdomyolysis: Secondary | ICD-10-CM | POA: Diagnosis not present

## 2021-12-25 DIAGNOSIS — M6281 Muscle weakness (generalized): Secondary | ICD-10-CM | POA: Diagnosis not present

## 2021-12-25 LAB — BASIC METABOLIC PANEL
BUN: 34 — AB (ref 4–21)
CO2: 25 — AB (ref 13–22)
Chloride: 102 (ref 99–108)
Creatinine: 1.3 (ref 0.6–1.3)
Glucose: 117
Potassium: 5.4 mEq/L — AB (ref 3.5–5.1)
Sodium: 138 (ref 137–147)

## 2021-12-25 LAB — COMPREHENSIVE METABOLIC PANEL: Calcium: 9 (ref 8.7–10.7)

## 2021-12-28 ENCOUNTER — Non-Acute Institutional Stay (SKILLED_NURSING_FACILITY): Payer: Medicare Other | Admitting: Adult Health

## 2021-12-28 ENCOUNTER — Encounter: Payer: Self-pay | Admitting: Adult Health

## 2021-12-28 DIAGNOSIS — E875 Hyperkalemia: Secondary | ICD-10-CM | POA: Diagnosis not present

## 2021-12-28 DIAGNOSIS — I10 Essential (primary) hypertension: Secondary | ICD-10-CM | POA: Diagnosis not present

## 2021-12-28 DIAGNOSIS — N1831 Chronic kidney disease, stage 3a: Secondary | ICD-10-CM | POA: Diagnosis not present

## 2021-12-28 DIAGNOSIS — N400 Enlarged prostate without lower urinary tract symptoms: Secondary | ICD-10-CM | POA: Diagnosis not present

## 2021-12-28 LAB — BASIC METABOLIC PANEL
BUN: 39 — AB (ref 4–21)
CO2: 23 — AB (ref 13–22)
Chloride: 103 (ref 99–108)
Creatinine: 1.3 (ref 0.6–1.3)
Glucose: 119
Potassium: 5.3 mEq/L — AB (ref 3.5–5.1)
Sodium: 138 (ref 137–147)

## 2021-12-28 LAB — COMPREHENSIVE METABOLIC PANEL: Calcium: 8.8 (ref 8.7–10.7)

## 2021-12-28 NOTE — Progress Notes (Signed)
? ?Location:  [facility] ?Nursing Home Room Number: 314-B ?Place of Service:  SNF (31) ?Provider:  Durenda Age, DNP, FNP-BC ? ?Patient Care Team: ?Mark Smoker, MD as PCP - General (Family Medicine) ? ?Extended Emergency Contact Information ?Primary Emergency Contact: Mark [place],Mark ? [place] ?Mobile Phone: [phone] ?Relation: Sister ? ?Code Status:   Full Code ? ?Goals of care: Advanced Directive information ? ?  12/11/2021  ?  2:59 PM  ?Advanced Directives  ?Does Patient Have a Medical Advance Directive? No  ?Would patient like information on creating a medical advance directive? No - Patient declined  ? ? ? ?Chief Complaint  ?Patient presents with  ? Acute Visit  ?  Hyperkalemia  ? ? ?HPI:  ?Pt is a 87 y.o. [place] seen today for an acute visit regarding elevated potassium, 5.3.  He has a PMH of BPH, IDA, hyperlipidemia, CHF and history of hematuria followed by urology.  He has GFR of 48.06, ranging in CKD stage IIIA.  He takes tamsulosin 0.4 mg 1 capsule daily for BPH. ? ?Past Medical History:  ?Diagnosis Date  ? Arthritis   ? L hip & R knee- OA  ? Cancer [facility])   ? basal cell CA- face  ? Shortness of breath   ? ?Past Surgical History:  ?Procedure Laterality Date  ? APPENDECTOMY    ? [facility]- 2011  ? EYE SURGERY    ? cataracts removed, laser (bilateral)  ? NASAL SINUS SURGERY    ? [facility]- 1980's   ? TOTAL HIP ARTHROPLASTY  11/24/2011  ? Procedure: TOTAL HIP ARTHROPLASTY;  Surgeon: Ninetta Lights, MD;  Location: [place];  Service: Orthopedics;  Laterality: Left;  ? variose    ? varicose vein surgery  ? ? ?No Known Allergies ? ?Outpatient Encounter Medications as of 12/28/2021  ?Medication Sig  ? bisacodyl (DULCOLAX) 10 MG suppository If not relieved by MOM, give 10 mg Bisacodyl suppositiory rectally X 1 dose in 24 hours as needed  ? citalopram (CELEXA) 10 MG tablet Take 10 mg by mouth daily. FOR ANXIETY  ? citalopram (CELEXA) 20 MG tablet Take 20 mg by mouth daily. FOR DEPRESSION/  ANXEITY  ? feeding supplement (ENSURE ENLIVE / ENSURE PLUS) LIQD Take 237 mLs by mouth 2 (two) times daily between meals.  ? Magnesium Hydroxide (MILK OF MAGNESIA PO) Take by mouth. If no BM in 3 days, give 30 cc Milk of Magnesium p.o. x 1 dose in 24 hours as needed (Do not use standing constipation orders for residents with renal failure CFR less than 30. Contact MD for orders) (Physician Order)  ? memantine (NAMENDA) 10 MG tablet Take 10 mg by mouth daily. FOR DEMENTIA  ? midodrine (PROAMATINE) 5 MG tablet Take 5 mg by mouth 2 (two) times daily.  ? NON FORMULARY Diet:Mechanical Soft double portions ?Magic Cup twice daily  ? QUEtiapine (SEROQUEL) 25 MG tablet Take 12.5 mg by mouth daily. for psychosis  ? Sodium Phosphates (RA SALINE ENEMA RE) If not relieved by Biscodyl suppository, give disposable Saline Enema rectally X 1 dose/24 hrs as needed  ? tamsulosin (FLOMAX) 0.4 MG CAPS capsule Take 0.4 mg by mouth daily.  ? white petrolatum (VASELINE) OINT Apply 1 application. topically as needed for dry skin.  ? carbamide peroxide (EAR DROPS) 6.5 % OTIC solution INSTILL 6 DROPS IN BOTH EARS TWICE A DAY FOR 4 DAYS. ON 5TH DAY FLUSH IN BOTH EARS TWICE A DAY FOR 1 DAY ONLY/IMPACTED CERUMEN  ? donepezil (  ARICEPT) 5 MG tablet Take 5 mg by mouth at bedtime.  ? ?No facility-administered encounter medications on file as of 12/28/2021.  ? ? ?Review of Systems  ?Constitutional:  Negative for activity change, appetite change and fever.  ?HENT:  Negative for sore throat.   ?Eyes: Negative.   ?Cardiovascular:  Negative for chest pain and leg swelling.  ?Gastrointestinal:  Negative for abdominal distention, diarrhea and vomiting.  ?Genitourinary:  Negative for dysuria, frequency and urgency.  ?Skin:  Negative for color change.  ?Neurological:  Negative for dizziness and headaches.  ?Psychiatric/Behavioral:  Negative for behavioral problems and sleep disturbance. The patient is not nervous/anxious.    ? ? ? ?Immunization History   ?Administered Date(s) Administered  ? Influenza Split 09/28/2004, 06/25/2013, 05/12/2016, 06/30/2017, 07/01/2017, 05/25/2018, 09/02/2018, 06/05/2019, 09/05/2020  ? Influenza, High Dose Seasonal PF 05/25/2018  ? Influenza,inj,quad, With Preservative 06/20/2014, 06/23/2015  ? Influenza-Unspecified 07/04/2021  ? PFIZER(Purple Top)SARS-COV-2 Vaccination 09/26/2019, 10/18/2019, 06/10/2020  ? Pneumococcal Conjugate-13 06/20/2014  ? Pneumococcal Polysaccharide-23 04/18/2006  ? Td 03/05/2002  ? Tdap 04/13/2017  ? Zoster, Live 10/08/2009  ? ?Pertinent  Health Maintenance Due  ?Topic Date Due  ? INFLUENZA VACCINE  04/13/2022  ? ? ?  11/15/2021  ?  8:00 PM 11/16/2021  ?  8:00 AM 11/16/2021  ?  8:00 PM 11/17/2021  ?  9:05 AM 11/17/2021  ?  7:00 PM  ?Fall Risk  ?Patient Fall Risk Level High fall risk High fall risk High fall risk High fall risk High fall risk  ? ? ? ?Vitals:  ? 12/28/21 1247  ?BP: (!) 105/57  ?Pulse: (!) 56  ?Resp: 20  ?Temp: 97.9 ?F (36.6 ?C)  ?SpO2: 100%  ?Weight: 172 lb 9.6 oz (78.3 kg)  ?Height: '5\' 9"'$  (1.753 m)  ? ?Body mass index is 25.49 kg/m?. ? ?Physical Exam ?Constitutional:   ?   General: He is not in acute distress. ?   Appearance: Normal appearance.  ?HENT:  ?   Head: Normocephalic and atraumatic.  ?   Mouth/Throat:  ?   Mouth: Mucous membranes are moist.  ?Eyes:  ?   Conjunctiva/sclera: Conjunctivae normal.  ?Cardiovascular:  ?   Rate and Rhythm: Normal rate and regular rhythm.  ?   Pulses: Normal pulses.  ?   Heart sounds: Normal heart sounds.  ?Pulmonary:  ?   Effort: Pulmonary effort is normal.  ?   Breath sounds: Normal breath sounds.  ?Abdominal:  ?   General: Bowel sounds are normal.  ?   Palpations: Abdomen is soft.  ?Musculoskeletal:     ?   General: No swelling.  ?   Cervical back: Normal range of motion.  ?Skin: ?   General: Skin is warm and dry.  ?Neurological:  ?   Mental Status: He is alert. Mental status is at baseline. He is disoriented.  ?   Comments: Alert to self, disoriented to time and  place.  ?Psychiatric:     ?   Mood and Affect: Mood normal.     ?   Behavior: Behavior normal.     ?   Thought Content: Thought content normal.     ?   Judgment: Judgment normal.  ?  ? ? ? ?Labs reviewed: ?Recent Labs  ?  11/11/21 ?1115 11/12/21 ?1045 11/13/21 ?0213 12/15/21 ?0000 12/25/21 ?0000  ?NA 137 140 135 135* 138  ?K 4.5 4.2 4.1 5.1 5.4*  ?CL 102 108 106 99 102  ?CO2 '22 24 23 22 '$ 25*  ?  GLUCOSE 91 91 100*  --   --   ?BUN 38* 32* 25* 28* 34*  ?CREATININE 1.55* 1.24 1.21 1.0 1.3  ?CALCIUM 9.1 8.6* 8.3* 9.2 9.0  ?MG 2.3  --   --   --   --   ? ?Recent Labs  ?  11/11/21 ?1115 11/12/21 ?1045 11/13/21 ?0213  ?AST 110* 71* 58*  ?ALT 34 31 30  ?ALKPHOS 58 50 48  ?BILITOT 1.7* 0.9 0.8  ?PROT 7.0 6.0* 5.8*  ?ALBUMIN 2.8* 2.4* 2.4*  ? ?Recent Labs  ?  11/11/21 ?1115 11/12/21 ?1045 11/13/21 ?0213 12/15/21 ?0000  ?WBC 10.8* 8.9 7.2 6.8  ?NEUTROABS 8.4*  --   --   --   ?HGB 12.3* 10.9* 10.2* 10.8*  ?HCT 36.8* 34.7* 31.0* 32*  ?MCV 93.9 98.9 94.8  --   ?PLT 270 233 239 213  ? ?Lab Results  ?Component Value Date  ? TSH 3.939 11/11/2021  ? ?No results found for: HGBA1C ?No results found for: CHOL, HDL, LDLCALC, LDLDIRECT, TRIG, CHOLHDL ? ?Significant Diagnostic Results in last 30 days:  ?No results found. ? ?Assessment/Plan ? ?1. Hyperkalemia ?-  K- 5.3, will give Kayexalate 15 g / 60 mL orally x1 ? ?2. Stage 3a chronic kidney disease ([place]) ?-  will monitor ? ?3. Benign prostatic hyperplasia without lower urinary tract symptoms ?-Continue tamsulosin ? ? ? ?Family/ staff Communication: Discussed plan of care with resident and charge nurse. ? ?Labs/tests ordered:   BMP on 12/29/2021 ? ? ? ?Durenda Age, DNP, MSN, FNP-BC ?[facility] ?[phone] (Monday-Friday 8:00 a.m. - 5:00 p.m.) ?[phone] (after hours) ? ?

## 2021-12-29 DIAGNOSIS — I10 Essential (primary) hypertension: Secondary | ICD-10-CM | POA: Diagnosis not present

## 2021-12-29 LAB — BASIC METABOLIC PANEL
BUN: 28 — AB (ref 4–21)
CO2: 26 — AB (ref 13–22)
Chloride: 103 (ref 99–108)
Creatinine: 1.3 (ref 0.6–1.3)
Glucose: 100
Potassium: 4.7 mEq/L (ref 3.5–5.1)
Sodium: 136 — AB (ref 137–147)

## 2021-12-29 LAB — COMPREHENSIVE METABOLIC PANEL: Calcium: 8.6 — AB (ref 8.7–10.7)

## 2022-01-21 ENCOUNTER — Encounter: Payer: Self-pay | Admitting: Internal Medicine

## 2022-01-21 ENCOUNTER — Non-Acute Institutional Stay (SKILLED_NURSING_FACILITY): Payer: Medicare Other | Admitting: Internal Medicine

## 2022-01-21 DIAGNOSIS — L299 Pruritus, unspecified: Secondary | ICD-10-CM | POA: Diagnosis not present

## 2022-01-21 DIAGNOSIS — R4189 Other symptoms and signs involving cognitive functions and awareness: Secondary | ICD-10-CM | POA: Diagnosis not present

## 2022-01-21 DIAGNOSIS — D509 Iron deficiency anemia, unspecified: Secondary | ICD-10-CM | POA: Diagnosis not present

## 2022-01-21 DIAGNOSIS — R29818 Other symptoms and signs involving the nervous system: Secondary | ICD-10-CM

## 2022-01-21 NOTE — Assessment & Plan Note (Signed)
Today he confabulated about needing more exercise and water when I attempted to evaluate the pruritus reported by staff.  His other focus was on his iron levels. ?

## 2022-01-21 NOTE — Assessment & Plan Note (Signed)
Fexofenadine 180 mg daily as needed was ordered.  No exam was completed as he declined to return to his room for such. ?

## 2022-01-21 NOTE — Patient Instructions (Signed)
See assessment and plan under each diagnosis in the problem list and acutely for this visit 

## 2022-01-21 NOTE — Assessment & Plan Note (Signed)
CBC and differential and iron panel will be performed. ?

## 2022-01-21 NOTE — Progress Notes (Signed)
? ?  NURSING HOME LOCATION:  [place] ?ROOM NUMBER:  [id number] ? ?CODE STATUS:  Full Code ? ?PCP:  Sela Hilding MD ? ?This is a nursing facility follow up visit for specific acute issue of pruritis. ? ?Interim medical record and care since last SNF visit was updated with review of diagnostic studies and change in clinical status since last visit were documented. ? ?HPI: Staff stated he was complaining of itching and scratching his thighs. I had difficulty finding him as he is very mobile in his wheelchair throughout the facility. ?When I began to question him about the itching he deferred an exam, declining to go back to his room.  He confabulated and focused on "needing more water and exercise".  He also expressed concern about having iron deficiency anemia.  There is no iron level in the chart.  His most recent CBC was 12/15/2021 with H/H of 10.8/32.  The only differential on record has 0 eosinophils in the differential. ?He gives a long history of "allergies" and previously saw Dr. Allyson Sabal, Dermatologist.  He also was seen at [facility]  When asked what diagnosis was made he could not give me a specific answer.  He stated that the [facility] told him " follow Dr. Ledell Peoples advice".  When asked what his recommendations were , the answer was "use suntan lotion". ?He validates itchy watery eyes and sneezing.  He denies any urticaria or angioedema. ? ?Physical exam: As noted he declined to return to his room for exam.  He was in the dining room for an activity session.  Hair is disheveled.  He is profoundly hard of hearing.Nares dry w/o clinical rhinitis. Voice is garbled and somewhat slurred.  There is isolated deformities of the fingernails.  He has faint bruising over the dorsum of the left hand which he expressed was an allergic manifestation. ? ?See summary under each active problem in the Problem List with associated updated therapeutic plan ? ? ?

## 2022-01-22 DIAGNOSIS — D509 Iron deficiency anemia, unspecified: Secondary | ICD-10-CM | POA: Diagnosis not present

## 2022-03-04 DIAGNOSIS — F03B0 Unspecified dementia, moderate, without behavioral disturbance, psychotic disturbance, mood disturbance, and anxiety: Secondary | ICD-10-CM | POA: Diagnosis not present

## 2022-03-04 DIAGNOSIS — F413 Other mixed anxiety disorders: Secondary | ICD-10-CM | POA: Diagnosis not present

## 2022-03-15 DIAGNOSIS — T8189XA Other complications of procedures, not elsewhere classified, initial encounter: Secondary | ICD-10-CM | POA: Diagnosis not present

## 2022-03-26 DIAGNOSIS — R000 Tachycardia, unspecified: Secondary | ICD-10-CM | POA: Diagnosis not present

## 2022-04-01 DIAGNOSIS — F03B0 Unspecified dementia, moderate, without behavioral disturbance, psychotic disturbance, mood disturbance, and anxiety: Secondary | ICD-10-CM | POA: Diagnosis not present

## 2022-04-01 DIAGNOSIS — F39 Unspecified mood [affective] disorder: Secondary | ICD-10-CM | POA: Diagnosis not present

## 2022-04-01 DIAGNOSIS — F413 Other mixed anxiety disorders: Secondary | ICD-10-CM | POA: Diagnosis not present

## 2022-04-09 DIAGNOSIS — F03B0 Unspecified dementia, moderate, without behavioral disturbance, psychotic disturbance, mood disturbance, and anxiety: Secondary | ICD-10-CM | POA: Diagnosis not present

## 2022-04-09 DIAGNOSIS — F413 Other mixed anxiety disorders: Secondary | ICD-10-CM | POA: Diagnosis not present

## 2022-04-15 DIAGNOSIS — F413 Other mixed anxiety disorders: Secondary | ICD-10-CM | POA: Diagnosis not present

## 2022-04-15 DIAGNOSIS — F03B0 Unspecified dementia, moderate, without behavioral disturbance, psychotic disturbance, mood disturbance, and anxiety: Secondary | ICD-10-CM | POA: Diagnosis not present

## 2022-04-15 DIAGNOSIS — F39 Unspecified mood [affective] disorder: Secondary | ICD-10-CM | POA: Diagnosis not present

## 2022-04-17 DIAGNOSIS — R2689 Other abnormalities of gait and mobility: Secondary | ICD-10-CM | POA: Diagnosis not present

## 2022-04-17 DIAGNOSIS — M6281 Muscle weakness (generalized): Secondary | ICD-10-CM | POA: Diagnosis not present

## 2022-04-17 DIAGNOSIS — R262 Difficulty in walking, not elsewhere classified: Secondary | ICD-10-CM | POA: Diagnosis not present

## 2022-04-19 DIAGNOSIS — M6281 Muscle weakness (generalized): Secondary | ICD-10-CM | POA: Diagnosis not present

## 2022-04-19 DIAGNOSIS — R262 Difficulty in walking, not elsewhere classified: Secondary | ICD-10-CM | POA: Diagnosis not present

## 2022-04-19 DIAGNOSIS — R2689 Other abnormalities of gait and mobility: Secondary | ICD-10-CM | POA: Diagnosis not present

## 2022-04-21 DIAGNOSIS — R2689 Other abnormalities of gait and mobility: Secondary | ICD-10-CM | POA: Diagnosis not present

## 2022-04-21 DIAGNOSIS — M6281 Muscle weakness (generalized): Secondary | ICD-10-CM | POA: Diagnosis not present

## 2022-04-21 DIAGNOSIS — R262 Difficulty in walking, not elsewhere classified: Secondary | ICD-10-CM | POA: Diagnosis not present

## 2022-04-26 DIAGNOSIS — R2689 Other abnormalities of gait and mobility: Secondary | ICD-10-CM | POA: Diagnosis not present

## 2022-04-26 DIAGNOSIS — R262 Difficulty in walking, not elsewhere classified: Secondary | ICD-10-CM | POA: Diagnosis not present

## 2022-04-26 DIAGNOSIS — M6281 Muscle weakness (generalized): Secondary | ICD-10-CM | POA: Diagnosis not present

## 2022-04-28 DIAGNOSIS — R2689 Other abnormalities of gait and mobility: Secondary | ICD-10-CM | POA: Diagnosis not present

## 2022-04-28 DIAGNOSIS — R262 Difficulty in walking, not elsewhere classified: Secondary | ICD-10-CM | POA: Diagnosis not present

## 2022-04-28 DIAGNOSIS — M6281 Muscle weakness (generalized): Secondary | ICD-10-CM | POA: Diagnosis not present

## 2022-04-29 DIAGNOSIS — F03B0 Unspecified dementia, moderate, without behavioral disturbance, psychotic disturbance, mood disturbance, and anxiety: Secondary | ICD-10-CM | POA: Diagnosis not present

## 2022-04-29 DIAGNOSIS — F39 Unspecified mood [affective] disorder: Secondary | ICD-10-CM | POA: Diagnosis not present

## 2022-04-29 DIAGNOSIS — F413 Other mixed anxiety disorders: Secondary | ICD-10-CM | POA: Diagnosis not present

## 2022-05-04 DIAGNOSIS — M6281 Muscle weakness (generalized): Secondary | ICD-10-CM | POA: Diagnosis not present

## 2022-05-04 DIAGNOSIS — R262 Difficulty in walking, not elsewhere classified: Secondary | ICD-10-CM | POA: Diagnosis not present

## 2022-05-04 DIAGNOSIS — R2689 Other abnormalities of gait and mobility: Secondary | ICD-10-CM | POA: Diagnosis not present

## 2022-05-05 DIAGNOSIS — R2689 Other abnormalities of gait and mobility: Secondary | ICD-10-CM | POA: Diagnosis not present

## 2022-05-05 DIAGNOSIS — R262 Difficulty in walking, not elsewhere classified: Secondary | ICD-10-CM | POA: Diagnosis not present

## 2022-05-05 DIAGNOSIS — M6281 Muscle weakness (generalized): Secondary | ICD-10-CM | POA: Diagnosis not present

## 2022-05-06 DIAGNOSIS — M6281 Muscle weakness (generalized): Secondary | ICD-10-CM | POA: Diagnosis not present

## 2022-05-06 DIAGNOSIS — R262 Difficulty in walking, not elsewhere classified: Secondary | ICD-10-CM | POA: Diagnosis not present

## 2022-05-06 DIAGNOSIS — R2689 Other abnormalities of gait and mobility: Secondary | ICD-10-CM | POA: Diagnosis not present

## 2022-05-10 DIAGNOSIS — T8189XA Other complications of procedures, not elsewhere classified, initial encounter: Secondary | ICD-10-CM | POA: Diagnosis not present

## 2022-05-10 DIAGNOSIS — M6281 Muscle weakness (generalized): Secondary | ICD-10-CM | POA: Diagnosis not present

## 2022-05-10 DIAGNOSIS — R2689 Other abnormalities of gait and mobility: Secondary | ICD-10-CM | POA: Diagnosis not present

## 2022-05-10 DIAGNOSIS — R262 Difficulty in walking, not elsewhere classified: Secondary | ICD-10-CM | POA: Diagnosis not present

## 2022-05-12 DIAGNOSIS — M6281 Muscle weakness (generalized): Secondary | ICD-10-CM | POA: Diagnosis not present

## 2022-05-12 DIAGNOSIS — R262 Difficulty in walking, not elsewhere classified: Secondary | ICD-10-CM | POA: Diagnosis not present

## 2022-05-12 DIAGNOSIS — R2689 Other abnormalities of gait and mobility: Secondary | ICD-10-CM | POA: Diagnosis not present

## 2022-05-13 DIAGNOSIS — R2689 Other abnormalities of gait and mobility: Secondary | ICD-10-CM | POA: Diagnosis not present

## 2022-05-13 DIAGNOSIS — R262 Difficulty in walking, not elsewhere classified: Secondary | ICD-10-CM | POA: Diagnosis not present

## 2022-05-13 DIAGNOSIS — M6281 Muscle weakness (generalized): Secondary | ICD-10-CM | POA: Diagnosis not present

## 2022-05-17 DIAGNOSIS — T8189XA Other complications of procedures, not elsewhere classified, initial encounter: Secondary | ICD-10-CM | POA: Diagnosis not present

## 2022-05-18 DIAGNOSIS — R262 Difficulty in walking, not elsewhere classified: Secondary | ICD-10-CM | POA: Diagnosis not present

## 2022-05-18 DIAGNOSIS — M6281 Muscle weakness (generalized): Secondary | ICD-10-CM | POA: Diagnosis not present

## 2022-05-18 DIAGNOSIS — M6282 Rhabdomyolysis: Secondary | ICD-10-CM | POA: Diagnosis not present

## 2022-05-18 DIAGNOSIS — R2689 Other abnormalities of gait and mobility: Secondary | ICD-10-CM | POA: Diagnosis not present

## 2022-05-19 DIAGNOSIS — R2689 Other abnormalities of gait and mobility: Secondary | ICD-10-CM | POA: Diagnosis not present

## 2022-05-19 DIAGNOSIS — M6282 Rhabdomyolysis: Secondary | ICD-10-CM | POA: Diagnosis not present

## 2022-05-19 DIAGNOSIS — M6281 Muscle weakness (generalized): Secondary | ICD-10-CM | POA: Diagnosis not present

## 2022-05-19 DIAGNOSIS — R262 Difficulty in walking, not elsewhere classified: Secondary | ICD-10-CM | POA: Diagnosis not present

## 2022-05-20 DIAGNOSIS — R2689 Other abnormalities of gait and mobility: Secondary | ICD-10-CM | POA: Diagnosis not present

## 2022-05-20 DIAGNOSIS — M6281 Muscle weakness (generalized): Secondary | ICD-10-CM | POA: Diagnosis not present

## 2022-05-20 DIAGNOSIS — R262 Difficulty in walking, not elsewhere classified: Secondary | ICD-10-CM | POA: Diagnosis not present

## 2022-05-20 DIAGNOSIS — M6282 Rhabdomyolysis: Secondary | ICD-10-CM | POA: Diagnosis not present

## 2022-05-24 DIAGNOSIS — T8189XA Other complications of procedures, not elsewhere classified, initial encounter: Secondary | ICD-10-CM | POA: Diagnosis not present

## 2022-05-25 DIAGNOSIS — R262 Difficulty in walking, not elsewhere classified: Secondary | ICD-10-CM | POA: Diagnosis not present

## 2022-05-25 DIAGNOSIS — M6281 Muscle weakness (generalized): Secondary | ICD-10-CM | POA: Diagnosis not present

## 2022-05-25 DIAGNOSIS — M6282 Rhabdomyolysis: Secondary | ICD-10-CM | POA: Diagnosis not present

## 2022-05-25 DIAGNOSIS — R2689 Other abnormalities of gait and mobility: Secondary | ICD-10-CM | POA: Diagnosis not present

## 2022-05-26 DIAGNOSIS — R262 Difficulty in walking, not elsewhere classified: Secondary | ICD-10-CM | POA: Diagnosis not present

## 2022-05-26 DIAGNOSIS — R2689 Other abnormalities of gait and mobility: Secondary | ICD-10-CM | POA: Diagnosis not present

## 2022-05-26 DIAGNOSIS — M6282 Rhabdomyolysis: Secondary | ICD-10-CM | POA: Diagnosis not present

## 2022-05-26 DIAGNOSIS — M6281 Muscle weakness (generalized): Secondary | ICD-10-CM | POA: Diagnosis not present

## 2022-05-27 DIAGNOSIS — M6281 Muscle weakness (generalized): Secondary | ICD-10-CM | POA: Diagnosis not present

## 2022-05-27 DIAGNOSIS — R262 Difficulty in walking, not elsewhere classified: Secondary | ICD-10-CM | POA: Diagnosis not present

## 2022-05-27 DIAGNOSIS — R2689 Other abnormalities of gait and mobility: Secondary | ICD-10-CM | POA: Diagnosis not present

## 2022-05-27 DIAGNOSIS — M6282 Rhabdomyolysis: Secondary | ICD-10-CM | POA: Diagnosis not present

## 2022-06-01 DIAGNOSIS — R262 Difficulty in walking, not elsewhere classified: Secondary | ICD-10-CM | POA: Diagnosis not present

## 2022-06-01 DIAGNOSIS — M6282 Rhabdomyolysis: Secondary | ICD-10-CM | POA: Diagnosis not present

## 2022-06-01 DIAGNOSIS — M6281 Muscle weakness (generalized): Secondary | ICD-10-CM | POA: Diagnosis not present

## 2022-06-01 DIAGNOSIS — R2689 Other abnormalities of gait and mobility: Secondary | ICD-10-CM | POA: Diagnosis not present

## 2022-06-02 DIAGNOSIS — M6281 Muscle weakness (generalized): Secondary | ICD-10-CM | POA: Diagnosis not present

## 2022-06-02 DIAGNOSIS — R2689 Other abnormalities of gait and mobility: Secondary | ICD-10-CM | POA: Diagnosis not present

## 2022-06-02 DIAGNOSIS — M6282 Rhabdomyolysis: Secondary | ICD-10-CM | POA: Diagnosis not present

## 2022-06-02 DIAGNOSIS — R262 Difficulty in walking, not elsewhere classified: Secondary | ICD-10-CM | POA: Diagnosis not present

## 2022-06-03 DIAGNOSIS — M6282 Rhabdomyolysis: Secondary | ICD-10-CM | POA: Diagnosis not present

## 2022-06-03 DIAGNOSIS — M6281 Muscle weakness (generalized): Secondary | ICD-10-CM | POA: Diagnosis not present

## 2022-06-03 DIAGNOSIS — R262 Difficulty in walking, not elsewhere classified: Secondary | ICD-10-CM | POA: Diagnosis not present

## 2022-06-03 DIAGNOSIS — R2689 Other abnormalities of gait and mobility: Secondary | ICD-10-CM | POA: Diagnosis not present

## 2022-06-04 DIAGNOSIS — T8189XA Other complications of procedures, not elsewhere classified, initial encounter: Secondary | ICD-10-CM | POA: Diagnosis not present

## 2022-06-04 DIAGNOSIS — M25552 Pain in left hip: Secondary | ICD-10-CM | POA: Diagnosis not present

## 2022-06-07 DIAGNOSIS — F0150 Vascular dementia without behavioral disturbance: Secondary | ICD-10-CM | POA: Diagnosis not present

## 2022-06-07 DIAGNOSIS — R1312 Dysphagia, oropharyngeal phase: Secondary | ICD-10-CM | POA: Diagnosis not present

## 2022-06-07 DIAGNOSIS — S71002D Unspecified open wound, left hip, subsequent encounter: Secondary | ICD-10-CM | POA: Diagnosis not present

## 2022-06-08 DIAGNOSIS — M6281 Muscle weakness (generalized): Secondary | ICD-10-CM | POA: Diagnosis not present

## 2022-06-08 DIAGNOSIS — R2689 Other abnormalities of gait and mobility: Secondary | ICD-10-CM | POA: Diagnosis not present

## 2022-06-08 DIAGNOSIS — R262 Difficulty in walking, not elsewhere classified: Secondary | ICD-10-CM | POA: Diagnosis not present

## 2022-06-08 DIAGNOSIS — M6282 Rhabdomyolysis: Secondary | ICD-10-CM | POA: Diagnosis not present

## 2022-06-09 DIAGNOSIS — R262 Difficulty in walking, not elsewhere classified: Secondary | ICD-10-CM | POA: Diagnosis not present

## 2022-06-09 DIAGNOSIS — M6282 Rhabdomyolysis: Secondary | ICD-10-CM | POA: Diagnosis not present

## 2022-06-09 DIAGNOSIS — R2689 Other abnormalities of gait and mobility: Secondary | ICD-10-CM | POA: Diagnosis not present

## 2022-06-09 DIAGNOSIS — M6281 Muscle weakness (generalized): Secondary | ICD-10-CM | POA: Diagnosis not present

## 2022-06-11 DIAGNOSIS — R2689 Other abnormalities of gait and mobility: Secondary | ICD-10-CM | POA: Diagnosis not present

## 2022-06-11 DIAGNOSIS — M6281 Muscle weakness (generalized): Secondary | ICD-10-CM | POA: Diagnosis not present

## 2022-06-11 DIAGNOSIS — R262 Difficulty in walking, not elsewhere classified: Secondary | ICD-10-CM | POA: Diagnosis not present

## 2022-06-11 DIAGNOSIS — M6282 Rhabdomyolysis: Secondary | ICD-10-CM | POA: Diagnosis not present

## 2022-06-15 DIAGNOSIS — R2689 Other abnormalities of gait and mobility: Secondary | ICD-10-CM | POA: Diagnosis not present

## 2022-06-15 DIAGNOSIS — R262 Difficulty in walking, not elsewhere classified: Secondary | ICD-10-CM | POA: Diagnosis not present

## 2022-06-15 DIAGNOSIS — M6281 Muscle weakness (generalized): Secondary | ICD-10-CM | POA: Diagnosis not present

## 2022-06-17 DIAGNOSIS — R262 Difficulty in walking, not elsewhere classified: Secondary | ICD-10-CM | POA: Diagnosis not present

## 2022-06-17 DIAGNOSIS — R2689 Other abnormalities of gait and mobility: Secondary | ICD-10-CM | POA: Diagnosis not present

## 2022-06-17 DIAGNOSIS — M6281 Muscle weakness (generalized): Secondary | ICD-10-CM | POA: Diagnosis not present

## 2022-06-18 DIAGNOSIS — R2689 Other abnormalities of gait and mobility: Secondary | ICD-10-CM | POA: Diagnosis not present

## 2022-06-18 DIAGNOSIS — M6281 Muscle weakness (generalized): Secondary | ICD-10-CM | POA: Diagnosis not present

## 2022-06-18 DIAGNOSIS — R262 Difficulty in walking, not elsewhere classified: Secondary | ICD-10-CM | POA: Diagnosis not present

## 2022-06-21 DIAGNOSIS — R2689 Other abnormalities of gait and mobility: Secondary | ICD-10-CM | POA: Diagnosis not present

## 2022-06-21 DIAGNOSIS — R262 Difficulty in walking, not elsewhere classified: Secondary | ICD-10-CM | POA: Diagnosis not present

## 2022-06-21 DIAGNOSIS — M6281 Muscle weakness (generalized): Secondary | ICD-10-CM | POA: Diagnosis not present

## 2022-06-22 DIAGNOSIS — M6281 Muscle weakness (generalized): Secondary | ICD-10-CM | POA: Diagnosis not present

## 2022-06-22 DIAGNOSIS — R2689 Other abnormalities of gait and mobility: Secondary | ICD-10-CM | POA: Diagnosis not present

## 2022-06-22 DIAGNOSIS — R262 Difficulty in walking, not elsewhere classified: Secondary | ICD-10-CM | POA: Diagnosis not present

## 2022-06-23 DIAGNOSIS — M6281 Muscle weakness (generalized): Secondary | ICD-10-CM | POA: Diagnosis not present

## 2022-06-23 DIAGNOSIS — R262 Difficulty in walking, not elsewhere classified: Secondary | ICD-10-CM | POA: Diagnosis not present

## 2022-06-23 DIAGNOSIS — R2689 Other abnormalities of gait and mobility: Secondary | ICD-10-CM | POA: Diagnosis not present

## 2022-06-28 DIAGNOSIS — R2689 Other abnormalities of gait and mobility: Secondary | ICD-10-CM | POA: Diagnosis not present

## 2022-06-28 DIAGNOSIS — R262 Difficulty in walking, not elsewhere classified: Secondary | ICD-10-CM | POA: Diagnosis not present

## 2022-06-28 DIAGNOSIS — M6281 Muscle weakness (generalized): Secondary | ICD-10-CM | POA: Diagnosis not present

## 2022-06-29 DIAGNOSIS — R2689 Other abnormalities of gait and mobility: Secondary | ICD-10-CM | POA: Diagnosis not present

## 2022-06-29 DIAGNOSIS — L98492 Non-pressure chronic ulcer of skin of other sites with fat layer exposed: Secondary | ICD-10-CM | POA: Diagnosis not present

## 2022-06-29 DIAGNOSIS — R262 Difficulty in walking, not elsewhere classified: Secondary | ICD-10-CM | POA: Diagnosis not present

## 2022-06-29 DIAGNOSIS — M6281 Muscle weakness (generalized): Secondary | ICD-10-CM | POA: Diagnosis not present

## 2022-06-30 DIAGNOSIS — R2689 Other abnormalities of gait and mobility: Secondary | ICD-10-CM | POA: Diagnosis not present

## 2022-06-30 DIAGNOSIS — R262 Difficulty in walking, not elsewhere classified: Secondary | ICD-10-CM | POA: Diagnosis not present

## 2022-06-30 DIAGNOSIS — M25552 Pain in left hip: Secondary | ICD-10-CM | POA: Diagnosis not present

## 2022-06-30 DIAGNOSIS — M6281 Muscle weakness (generalized): Secondary | ICD-10-CM | POA: Diagnosis not present

## 2022-07-03 DIAGNOSIS — R2689 Other abnormalities of gait and mobility: Secondary | ICD-10-CM | POA: Diagnosis not present

## 2022-07-03 DIAGNOSIS — R262 Difficulty in walking, not elsewhere classified: Secondary | ICD-10-CM | POA: Diagnosis not present

## 2022-07-03 DIAGNOSIS — M6281 Muscle weakness (generalized): Secondary | ICD-10-CM | POA: Diagnosis not present

## 2022-07-05 DIAGNOSIS — M6281 Muscle weakness (generalized): Secondary | ICD-10-CM | POA: Diagnosis not present

## 2022-07-05 DIAGNOSIS — R2689 Other abnormalities of gait and mobility: Secondary | ICD-10-CM | POA: Diagnosis not present

## 2022-07-05 DIAGNOSIS — R262 Difficulty in walking, not elsewhere classified: Secondary | ICD-10-CM | POA: Diagnosis not present

## 2022-07-08 DIAGNOSIS — R2689 Other abnormalities of gait and mobility: Secondary | ICD-10-CM | POA: Diagnosis not present

## 2022-07-08 DIAGNOSIS — R262 Difficulty in walking, not elsewhere classified: Secondary | ICD-10-CM | POA: Diagnosis not present

## 2022-07-08 DIAGNOSIS — M6281 Muscle weakness (generalized): Secondary | ICD-10-CM | POA: Diagnosis not present

## 2022-07-13 DIAGNOSIS — R2689 Other abnormalities of gait and mobility: Secondary | ICD-10-CM | POA: Diagnosis not present

## 2022-07-13 DIAGNOSIS — R262 Difficulty in walking, not elsewhere classified: Secondary | ICD-10-CM | POA: Diagnosis not present

## 2022-07-13 DIAGNOSIS — M6281 Muscle weakness (generalized): Secondary | ICD-10-CM | POA: Diagnosis not present

## 2022-07-14 DIAGNOSIS — I10 Essential (primary) hypertension: Secondary | ICD-10-CM | POA: Diagnosis not present

## 2022-07-14 DIAGNOSIS — R2689 Other abnormalities of gait and mobility: Secondary | ICD-10-CM | POA: Diagnosis not present

## 2022-07-14 DIAGNOSIS — R609 Edema, unspecified: Secondary | ICD-10-CM | POA: Diagnosis not present

## 2022-07-14 DIAGNOSIS — S71002D Unspecified open wound, left hip, subsequent encounter: Secondary | ICD-10-CM | POA: Diagnosis not present

## 2022-07-14 DIAGNOSIS — G9341 Metabolic encephalopathy: Secondary | ICD-10-CM | POA: Diagnosis not present

## 2022-07-14 DIAGNOSIS — M6281 Muscle weakness (generalized): Secondary | ICD-10-CM | POA: Diagnosis not present

## 2022-07-14 DIAGNOSIS — R262 Difficulty in walking, not elsewhere classified: Secondary | ICD-10-CM | POA: Diagnosis not present

## 2022-07-14 DIAGNOSIS — M6282 Rhabdomyolysis: Secondary | ICD-10-CM | POA: Diagnosis not present

## 2022-07-15 DIAGNOSIS — E7220 Disorder of urea cycle metabolism, unspecified: Secondary | ICD-10-CM | POA: Diagnosis not present

## 2022-07-16 DIAGNOSIS — R262 Difficulty in walking, not elsewhere classified: Secondary | ICD-10-CM | POA: Diagnosis not present

## 2022-07-16 DIAGNOSIS — R2689 Other abnormalities of gait and mobility: Secondary | ICD-10-CM | POA: Diagnosis not present

## 2022-07-16 DIAGNOSIS — M6282 Rhabdomyolysis: Secondary | ICD-10-CM | POA: Diagnosis not present

## 2022-07-16 DIAGNOSIS — M6281 Muscle weakness (generalized): Secondary | ICD-10-CM | POA: Diagnosis not present

## 2022-07-21 DIAGNOSIS — R410 Disorientation, unspecified: Secondary | ICD-10-CM | POA: Diagnosis not present

## 2022-07-21 DIAGNOSIS — R059 Cough, unspecified: Secondary | ICD-10-CM | POA: Diagnosis not present

## 2022-07-21 DIAGNOSIS — J1281 Pneumonia due to SARS-associated coronavirus: Secondary | ICD-10-CM | POA: Diagnosis not present

## 2022-07-21 DIAGNOSIS — U071 COVID-19: Secondary | ICD-10-CM | POA: Diagnosis not present

## 2022-07-22 DIAGNOSIS — G8929 Other chronic pain: Secondary | ICD-10-CM | POA: Diagnosis not present

## 2022-07-22 DIAGNOSIS — I10 Essential (primary) hypertension: Secondary | ICD-10-CM | POA: Diagnosis not present

## 2022-07-22 DIAGNOSIS — R410 Disorientation, unspecified: Secondary | ICD-10-CM | POA: Diagnosis not present

## 2022-07-23 ENCOUNTER — Emergency Department (HOSPITAL_COMMUNITY): Payer: Medicare Other

## 2022-07-23 ENCOUNTER — Encounter (HOSPITAL_COMMUNITY): Payer: Self-pay | Admitting: Emergency Medicine

## 2022-07-23 ENCOUNTER — Inpatient Hospital Stay (HOSPITAL_COMMUNITY)
Admission: EM | Admit: 2022-07-23 | Discharge: 2022-07-29 | DRG: 862 | Disposition: A | Discharge status: Skilled Nursing Facility | Payer: Medicare Other | Source: Skilled Nursing Facility | Attending: Internal Medicine | Admitting: Internal Medicine

## 2022-07-23 ENCOUNTER — Other Ambulatory Visit: Payer: Self-pay

## 2022-07-23 DIAGNOSIS — N189 Chronic kidney disease, unspecified: Secondary | ICD-10-CM | POA: Diagnosis present

## 2022-07-23 DIAGNOSIS — I5032 Chronic diastolic (congestive) heart failure: Secondary | ICD-10-CM | POA: Diagnosis not present

## 2022-07-23 DIAGNOSIS — Y831 Surgical operation with implant of artificial internal device as the cause of abnormal reaction of the patient, or of later complication, without mention of misadventure at the time of the procedure: Secondary | ICD-10-CM | POA: Diagnosis present

## 2022-07-23 DIAGNOSIS — J9811 Atelectasis: Secondary | ICD-10-CM | POA: Diagnosis not present

## 2022-07-23 DIAGNOSIS — L03119 Cellulitis of unspecified part of limb: Secondary | ICD-10-CM | POA: Diagnosis not present

## 2022-07-23 DIAGNOSIS — N179 Acute kidney failure, unspecified: Secondary | ICD-10-CM | POA: Diagnosis not present

## 2022-07-23 DIAGNOSIS — L89159 Pressure ulcer of sacral region, unspecified stage: Secondary | ICD-10-CM

## 2022-07-23 DIAGNOSIS — Z28310 Unvaccinated for covid-19: Secondary | ICD-10-CM | POA: Diagnosis not present

## 2022-07-23 DIAGNOSIS — I130 Hypertensive heart and chronic kidney disease with heart failure and stage 1 through stage 4 chronic kidney disease, or unspecified chronic kidney disease: Secondary | ICD-10-CM | POA: Diagnosis not present

## 2022-07-23 DIAGNOSIS — Y838 Other surgical procedures as the cause of abnormal reaction of the patient, or of later complication, without mention of misadventure at the time of the procedure: Secondary | ICD-10-CM | POA: Diagnosis present

## 2022-07-23 DIAGNOSIS — Z781 Physical restraint status: Secondary | ICD-10-CM

## 2022-07-23 DIAGNOSIS — R4182 Altered mental status, unspecified: Secondary | ICD-10-CM | POA: Diagnosis not present

## 2022-07-23 DIAGNOSIS — T8141XA Infection following a procedure, superficial incisional surgical site, initial encounter: Secondary | ICD-10-CM | POA: Diagnosis not present

## 2022-07-23 DIAGNOSIS — E869 Volume depletion, unspecified: Secondary | ICD-10-CM | POA: Diagnosis present

## 2022-07-23 DIAGNOSIS — S71002A Unspecified open wound, left hip, initial encounter: Secondary | ICD-10-CM | POA: Diagnosis not present

## 2022-07-23 DIAGNOSIS — N1831 Chronic kidney disease, stage 3a: Secondary | ICD-10-CM | POA: Diagnosis present

## 2022-07-23 DIAGNOSIS — J1282 Pneumonia due to coronavirus disease 2019: Secondary | ICD-10-CM | POA: Diagnosis present

## 2022-07-23 DIAGNOSIS — T8452XA Infection and inflammatory reaction due to internal left hip prosthesis, initial encounter: Secondary | ICD-10-CM | POA: Diagnosis not present

## 2022-07-23 DIAGNOSIS — F03918 Unspecified dementia, unspecified severity, with other behavioral disturbance: Secondary | ICD-10-CM | POA: Diagnosis not present

## 2022-07-23 DIAGNOSIS — G9341 Metabolic encephalopathy: Secondary | ICD-10-CM | POA: Diagnosis not present

## 2022-07-23 DIAGNOSIS — Z79899 Other long term (current) drug therapy: Secondary | ICD-10-CM | POA: Diagnosis not present

## 2022-07-23 DIAGNOSIS — U071 COVID-19: Secondary | ICD-10-CM | POA: Diagnosis not present

## 2022-07-23 DIAGNOSIS — R350 Frequency of micturition: Secondary | ICD-10-CM | POA: Diagnosis not present

## 2022-07-23 DIAGNOSIS — N400 Enlarged prostate without lower urinary tract symptoms: Secondary | ICD-10-CM | POA: Diagnosis present

## 2022-07-23 DIAGNOSIS — G928 Other toxic encephalopathy: Secondary | ICD-10-CM | POA: Diagnosis present

## 2022-07-23 DIAGNOSIS — L03115 Cellulitis of right lower limb: Secondary | ICD-10-CM | POA: Diagnosis present

## 2022-07-23 DIAGNOSIS — R410 Disorientation, unspecified: Secondary | ICD-10-CM | POA: Diagnosis not present

## 2022-07-23 DIAGNOSIS — E785 Hyperlipidemia, unspecified: Secondary | ICD-10-CM | POA: Diagnosis not present

## 2022-07-23 DIAGNOSIS — N401 Enlarged prostate with lower urinary tract symptoms: Secondary | ICD-10-CM | POA: Diagnosis not present

## 2022-07-23 DIAGNOSIS — Z96642 Presence of left artificial hip joint: Secondary | ICD-10-CM | POA: Diagnosis not present

## 2022-07-23 DIAGNOSIS — T8149XA Infection following a procedure, other surgical site, initial encounter: Secondary | ICD-10-CM | POA: Diagnosis not present

## 2022-07-23 DIAGNOSIS — G9340 Encephalopathy, unspecified: Secondary | ICD-10-CM | POA: Diagnosis not present

## 2022-07-23 DIAGNOSIS — M1711 Unilateral primary osteoarthritis, right knee: Secondary | ICD-10-CM | POA: Diagnosis present

## 2022-07-23 DIAGNOSIS — Z471 Aftercare following joint replacement surgery: Secondary | ICD-10-CM | POA: Diagnosis not present

## 2022-07-23 DIAGNOSIS — R700 Elevated erythrocyte sedimentation rate: Secondary | ICD-10-CM | POA: Diagnosis present

## 2022-07-23 DIAGNOSIS — Z85828 Personal history of other malignant neoplasm of skin: Secondary | ICD-10-CM

## 2022-07-23 DIAGNOSIS — T68XXXA Hypothermia, initial encounter: Secondary | ICD-10-CM | POA: Diagnosis not present

## 2022-07-23 DIAGNOSIS — R059 Cough, unspecified: Secondary | ICD-10-CM | POA: Diagnosis not present

## 2022-07-23 DIAGNOSIS — M1612 Unilateral primary osteoarthritis, left hip: Secondary | ICD-10-CM | POA: Diagnosis present

## 2022-07-23 DIAGNOSIS — Z87891 Personal history of nicotine dependence: Secondary | ICD-10-CM | POA: Diagnosis not present

## 2022-07-23 DIAGNOSIS — G8929 Other chronic pain: Secondary | ICD-10-CM | POA: Diagnosis not present

## 2022-07-23 DIAGNOSIS — M869 Osteomyelitis, unspecified: Secondary | ICD-10-CM | POA: Diagnosis not present

## 2022-07-23 DIAGNOSIS — F0390 Unspecified dementia without behavioral disturbance: Secondary | ICD-10-CM

## 2022-07-23 DIAGNOSIS — I959 Hypotension, unspecified: Secondary | ICD-10-CM | POA: Diagnosis not present

## 2022-07-23 DIAGNOSIS — R32 Unspecified urinary incontinence: Secondary | ICD-10-CM | POA: Diagnosis present

## 2022-07-23 DIAGNOSIS — J9601 Acute respiratory failure with hypoxia: Secondary | ICD-10-CM | POA: Diagnosis present

## 2022-07-23 DIAGNOSIS — D539 Nutritional anemia, unspecified: Secondary | ICD-10-CM | POA: Diagnosis present

## 2022-07-23 DIAGNOSIS — I10 Essential (primary) hypertension: Secondary | ICD-10-CM | POA: Diagnosis not present

## 2022-07-23 LAB — COMPREHENSIVE METABOLIC PANEL
ALT: 17 U/L (ref 0–44)
AST: 21 U/L (ref 15–41)
Albumin: 2.7 g/dL — ABNORMAL LOW (ref 3.5–5.0)
Alkaline Phosphatase: 45 U/L (ref 38–126)
Anion gap: 10 (ref 5–15)
BUN: 52 mg/dL — ABNORMAL HIGH (ref 8–23)
CO2: 28 mmol/L (ref 22–32)
Calcium: 8.9 mg/dL (ref 8.9–10.3)
Chloride: 100 mmol/L (ref 98–111)
Creatinine, Ser: 1.76 mg/dL — ABNORMAL HIGH (ref 0.61–1.24)
GFR, Estimated: 37 mL/min — ABNORMAL LOW (ref >60)
Glucose, Bld: 89 mg/dL (ref 70–99)
Potassium: 4.9 mmol/L (ref 3.5–5.1)
Sodium: 138 mmol/L (ref 135–145)
Total Bilirubin: 0.7 mg/dL (ref 0.3–1.2)
Total Protein: 7.2 g/dL (ref 6.5–8.1)

## 2022-07-23 LAB — CBC WITH DIFFERENTIAL/PLATELET
Abs Immature Granulocytes: 0.06 10*3/uL (ref 0.00–0.07)
Basophils Absolute: 0.1 10*3/uL (ref 0.0–0.1)
Basophils Relative: 1 %
Eosinophils Absolute: 0.1 10*3/uL (ref 0.0–0.5)
Eosinophils Relative: 1 %
HCT: 32.3 % — ABNORMAL LOW (ref 39.0–52.0)
Hemoglobin: 10.7 g/dL — ABNORMAL LOW (ref 13.0–17.0)
Immature Granulocytes: 1 %
Lymphocytes Relative: 20 %
Lymphs Abs: 1.5 10*3/uL (ref 0.7–4.0)
MCH: 33.3 pg (ref 26.0–34.0)
MCHC: 33.1 g/dL (ref 30.0–36.0)
MCV: 100.6 fL — ABNORMAL HIGH (ref 80.0–100.0)
Monocytes Absolute: 1 10*3/uL (ref 0.1–1.0)
Monocytes Relative: 14 %
Neutro Abs: 4.6 10*3/uL (ref 1.7–7.7)
Neutrophils Relative %: 63 %
Platelets: 185 10*3/uL (ref 150–400)
RBC: 3.21 MIL/uL — ABNORMAL LOW (ref 4.22–5.81)
RDW: 13.1 % (ref 11.5–15.5)
WBC: 7.2 10*3/uL (ref 4.0–10.5)
nRBC: 0 % (ref 0.0–0.2)

## 2022-07-23 LAB — URINALYSIS, ROUTINE W REFLEX MICROSCOPIC
Bilirubin Urine: NEGATIVE
Glucose, UA: NEGATIVE mg/dL
Hgb urine dipstick: NEGATIVE
Ketones, ur: NEGATIVE mg/dL
Leukocytes,Ua: NEGATIVE
Nitrite: NEGATIVE
Protein, ur: NEGATIVE mg/dL
Specific Gravity, Urine: 1.014 (ref 1.005–1.030)
pH: 6 (ref 5.0–8.0)

## 2022-07-23 LAB — RESP PANEL BY RT-PCR (FLU A&B, COVID) ARPGX2
Influenza A by PCR: NEGATIVE
Influenza B by PCR: NEGATIVE
SARS Coronavirus 2 by RT PCR: POSITIVE — AB

## 2022-07-23 LAB — BRAIN NATRIURETIC PEPTIDE: B Natriuretic Peptide: 119.7 pg/mL — ABNORMAL HIGH (ref 0.0–100.0)

## 2022-07-23 LAB — LACTIC ACID, PLASMA: Lactic Acid, Venous: 1.4 mmol/L (ref 0.5–1.9)

## 2022-07-23 LAB — SEDIMENTATION RATE: Sed Rate: 107 mm/hr — ABNORMAL HIGH (ref 0–16)

## 2022-07-23 LAB — C-REACTIVE PROTEIN: CRP: 10.3 mg/dL — ABNORMAL HIGH (ref <1.0)

## 2022-07-23 LAB — TROPONIN I (HIGH SENSITIVITY): Troponin I (High Sensitivity): 15 ng/L (ref <18)

## 2022-07-23 LAB — VALPROIC ACID LEVEL: Valproic Acid Lvl: 28 ug/mL — ABNORMAL LOW (ref 50.0–100.0)

## 2022-07-23 MED ORDER — CITALOPRAM HYDROBROMIDE 20 MG PO TABS
20.0000 mg | ORAL_TABLET | Freq: Every day | ORAL | Status: DC
  Filled 2022-07-23: qty 1

## 2022-07-23 MED ORDER — TAMSULOSIN HCL 0.4 MG PO CAPS
0.4000 mg | ORAL_CAPSULE | Freq: Every day | ORAL | Status: DC
  Administered 2022-07-23 – 2022-07-28 (×4): 0.4 mg via ORAL
  Filled 2022-07-23 (×5): qty 1

## 2022-07-23 MED ORDER — ENOXAPARIN SODIUM 30 MG/0.3ML IJ SOSY
30.0000 mg | PREFILLED_SYRINGE | INTRAMUSCULAR | Status: DC
  Administered 2022-07-23 – 2022-07-24 (×2): 30 mg via SUBCUTANEOUS
  Filled 2022-07-23 (×2): qty 0.3

## 2022-07-23 MED ORDER — OLANZAPINE 10 MG IM SOLR
5.0000 mg | Freq: Once | INTRAMUSCULAR | Status: AC
  Administered 2022-07-23: 5 mg via INTRAMUSCULAR
  Filled 2022-07-23: qty 10

## 2022-07-23 MED ORDER — DIVALPROEX SODIUM 125 MG PO CSDR: 125.0000 mg | DELAYED_RELEASE_CAPSULE | Freq: Three times a day (TID) | ORAL | Status: DC

## 2022-07-23 MED ORDER — SODIUM CHLORIDE 0.9 % IV SOLN: INTRAVENOUS | Status: DC

## 2022-07-23 MED ORDER — ZINC SULFATE 220 (50 ZN) MG PO CAPS
220.0000 mg | ORAL_CAPSULE | Freq: Every day | ORAL | Status: DC
  Filled 2022-07-23 (×2): qty 1

## 2022-07-23 MED ORDER — MEMANTINE HCL 10 MG PO TABS
10.0000 mg | ORAL_TABLET | Freq: Every day | ORAL | Status: DC
  Administered 2022-07-23: 10 mg via ORAL
  Filled 2022-07-23: qty 1

## 2022-07-23 MED ORDER — SODIUM CHLORIDE 0.9 % IV SOLN
2.0000 g | INTRAVENOUS | Status: DC
  Administered 2022-07-23 – 2022-07-28 (×6): 2 g via INTRAVENOUS
  Filled 2022-07-23 (×5): qty 20

## 2022-07-23 MED ORDER — VANCOMYCIN HCL 750 MG/150ML IV SOLN
750.0000 mg | INTRAVENOUS | Status: DC
  Administered 2022-07-25: 750 mg via INTRAVENOUS
  Filled 2022-07-23 (×3): qty 150

## 2022-07-23 MED ORDER — LORAZEPAM 2 MG/ML IJ SOLN
0.5000 mg | Freq: Four times a day (QID) | INTRAMUSCULAR | Status: DC | PRN
  Administered 2022-07-23 – 2022-07-24 (×2): 0.5 mg via INTRAVENOUS
  Filled 2022-07-23 (×2): qty 1

## 2022-07-23 MED ORDER — MOLNUPIRAVIR EUA 200MG CAPSULE
4.0000 | ORAL_CAPSULE | Freq: Two times a day (BID) | ORAL | Status: DC
  Administered 2022-07-23: 800 mg via ORAL
  Filled 2022-07-23: qty 4

## 2022-07-23 MED ORDER — SODIUM CHLORIDE 0.9 % IV SOLN: 1.0000 g | Freq: Once | INTRAVENOUS | Status: DC

## 2022-07-23 MED ORDER — ALBUTEROL SULFATE HFA 108 (90 BASE) MCG/ACT IN AERS
2.0000 | INHALATION_SPRAY | Freq: Four times a day (QID) | RESPIRATORY_TRACT | Status: DC
  Administered 2022-07-23 – 2022-07-24 (×4): 2 via RESPIRATORY_TRACT
  Filled 2022-07-23: qty 6.7

## 2022-07-23 MED ORDER — WHITE PETROLATUM EX OINT: 1.0000 | TOPICAL_OINTMENT | Freq: Every day | CUTANEOUS | Status: DC | PRN

## 2022-07-23 MED ORDER — SODIUM CHLORIDE 0.9% FLUSH
3.0000 mL | Freq: Two times a day (BID) | INTRAVENOUS | Status: DC
  Administered 2022-07-23 – 2022-07-29 (×7): 3 mL via INTRAVENOUS

## 2022-07-23 MED ORDER — VANCOMYCIN HCL 1500 MG/300ML IV SOLN
1500.0000 mg | Freq: Once | INTRAVENOUS | Status: AC
  Administered 2022-07-23: 1500 mg via INTRAVENOUS
  Filled 2022-07-23: qty 300

## 2022-07-23 MED ORDER — VALPROATE SODIUM 100 MG/ML IV SOLN
95.0000 mg | Freq: Four times a day (QID) | INTRAVENOUS | Status: DC
  Filled 2022-07-23 (×6): qty 0.95

## 2022-07-23 MED ORDER — VALPROATE SODIUM 100 MG/ML IV SOLN
190.0000 mg | Freq: Four times a day (QID) | INTRAVENOUS | Status: DC
  Administered 2022-07-23 – 2022-07-26 (×11): 190 mg via INTRAVENOUS
  Filled 2022-07-23 (×15): qty 1.9

## 2022-07-23 MED ORDER — ACETAMINOPHEN 650 MG RE SUPP
650.0000 mg | Freq: Four times a day (QID) | RECTAL | Status: DC | PRN
  Administered 2022-07-24: 650 mg via RECTAL
  Filled 2022-07-23: qty 1

## 2022-07-23 MED ORDER — TRAMADOL HCL 50 MG PO TABS: 50.0000 mg | ORAL_TABLET | Freq: Two times a day (BID) | ORAL | Status: DC | PRN

## 2022-07-23 MED ORDER — IOHEXOL 350 MG/ML SOLN
70.0000 mL | Freq: Once | INTRAVENOUS | Status: AC | PRN
  Administered 2022-07-23: 70 mL via INTRAVENOUS

## 2022-07-23 MED ORDER — HYDROCOD POLI-CHLORPHE POLI ER 10-8 MG/5ML PO SUER: 5.0000 mL | Freq: Two times a day (BID) | ORAL | Status: DC | PRN

## 2022-07-23 MED ORDER — GUAIFENESIN-DM 100-10 MG/5ML PO SYRP: 10.0000 mL | ORAL_SOLUTION | ORAL | Status: DC | PRN

## 2022-07-23 MED ORDER — LACTATED RINGERS IV BOLUS
1000.0000 mL | Freq: Once | INTRAVENOUS | Status: AC
  Administered 2022-07-23: 1000 mL via INTRAVENOUS

## 2022-07-23 MED ORDER — ACETAMINOPHEN 325 MG PO TABS: 650.0000 mg | ORAL_TABLET | Freq: Four times a day (QID) | ORAL | Status: DC | PRN

## 2022-07-23 MED ORDER — VITAMIN C 500 MG PO TABS
500.0000 mg | ORAL_TABLET | Freq: Every day | ORAL | Status: DC
  Filled 2022-07-23 (×2): qty 1

## 2022-07-23 NOTE — ED Notes (Signed)
Patient transported to CT 

## 2022-07-23 NOTE — Progress Notes (Signed)
Pharmacy Antibiotic Note  Mark Howe is a 87 y.o. male admitted on 07/23/2022 presenting from [facility] with hip pain, AMS, concern for hip infection and osteomyelitis workup.  Pharmacy has been consulted for vancomycin dosing.  Gram negative coverage per MD  Plan: Vancomycin 1500 mg IV x 1, then 750 mg IV q 24h (eAUC 465) Monitor renal function, Cx and osteomyelitis workup to narrow Vancomycin levels as needed     Temp (24hrs), Avg:98 F (36.7 C), Min:98 F (36.7 C), Max:98 F (36.7 C)  Recent Labs  Lab 07/23/22 1202  WBC 7.2  CREATININE 1.76*  LATICACIDVEN 1.4    CrCl cannot be calculated (Unknown ideal weight.).    No Known Allergies  Mark Howe, PharmD Clinical Pharmacist ED Pharmacist Phone # [phone] 07/23/2022 3:31 PM

## 2022-07-23 NOTE — Consult Note (Signed)
Reason for Consult:Left hip wound Referring Physician: Georgina Snell Time called: [phone] Time at bedside: Glasco is an 87 y.o. male.  HPI: Odai was brought to the ED from a facility with increased confusion. He was diagnosed with Covid 3d ago. He was noted to have a chronic draining left hip wound. CT was obtained and orthopedic surgery was consulted. He is confused and cannot contribute meaningfully to history. This hip has been draining from at least 2016. It is unclear if he is ambulatory baseline.  Past Medical History:  Diagnosis Date   Arthritis    L hip & R knee- OA   Cancer ([place])    basal cell CA- face   Shortness of breath     Past Surgical History:  Procedure Laterality Date   APPENDECTOMY     [facility]- 2011   EYE SURGERY     cataracts removed, laser (bilateral)   NASAL SINUS SURGERY     [facility]- [facility]'s    TOTAL HIP ARTHROPLASTY  11/24/2011   Procedure: TOTAL HIP ARTHROPLASTY;  Surgeon: Ninetta Lights, MD;  Location: [facility];  Service: Orthopedics;  Laterality: Left;   variose     varicose vein surgery    Family History  Problem Relation Age of Onset   Anesthesia problems Neg Hx     Social History:  reports that he quit smoking about 13 years ago. His smoking use included cigarettes. He has never used smokeless tobacco. He reports that he does not drink alcohol and does not use drugs.  Allergies: No Known Allergies  Medications: I have reviewed the patient's current medications.  Results for orders placed or performed during the hospital encounter of 07/23/22 (from the past 48 hour(s))  Urinalysis, Routine w reflex microscopic Urine, Clean Catch     Status: Abnormal   Collection Time: 07/23/22 11:35 AM  Result Value Ref Range   Color, Urine YELLOW YELLOW   APPearance HAZY (A) CLEAR   Specific Gravity, Urine 1.014 1.005 - 1.030   pH 6.0 5.0 - 8.0   Glucose, UA NEGATIVE NEGATIVE mg/dL   Hgb urine dipstick NEGATIVE NEGATIVE   Bilirubin Urine NEGATIVE  NEGATIVE   Ketones, ur NEGATIVE NEGATIVE mg/dL   Protein, ur NEGATIVE NEGATIVE mg/dL   Nitrite NEGATIVE NEGATIVE   Leukocytes,Ua NEGATIVE NEGATIVE    Comment: Performed at [facility] [address] [place], [place] [address]  Resp Panel by RT-PCR (Flu A&B, Covid) Anterior Nasal Swab     Status: Abnormal   Collection Time: 07/23/22 11:35 AM   Specimen: Anterior Nasal Swab  Result Value Ref Range   SARS Coronavirus 2 by RT PCR POSITIVE (A) NEGATIVE    Comment: (NOTE) SARS-CoV-2 target nucleic acids are DETECTED.  The SARS-CoV-2 RNA is generally detectable in upper respiratory specimens during the acute phase of infection. Positive results are indicative of the presence of the identified virus, but do not rule out bacterial infection or co-infection with other pathogens not detected by the test. Clinical correlation with patient history and other diagnostic information is necessary to determine patient infection status. The expected result is Negative.  Fact Sheet for Patients: EntrepreneurPulse.com.au  Fact Sheet for Healthcare Providers: IncredibleEmployment.be  This test is not yet approved or cleared by the [place] FDA and  has been authorized for detection and/or diagnosis of SARS-CoV-2 by FDA under an Emergency Use Authorization (EUA).  This EUA will remain in effect (meaning this test can be used) for the  duration of  the COVID-19 declaration under Section 564(b)(1) of the A ct, 21 U.S.C. section 360bbb-3(b)(1), unless the authorization is terminated or revoked sooner.     Influenza A by PCR NEGATIVE NEGATIVE   Influenza B by PCR NEGATIVE NEGATIVE    Comment: (NOTE) The Xpert Xpress SARS-CoV-2/FLU/RSV plus assay is intended as an aid in the diagnosis of influenza from Nasopharyngeal swab specimens and should not be used as a sole basis for treatment. Nasal washings and aspirates are unacceptable for Xpert Xpress  SARS-CoV-2/FLU/RSV testing.  Fact Sheet for Patients: EntrepreneurPulse.com.au  Fact Sheet for Healthcare Providers: IncredibleEmployment.be  This test is not yet approved or cleared by the [place] FDA and has been authorized for detection and/or diagnosis of SARS-CoV-2 by FDA under an Emergency Use Authorization (EUA). This EUA will remain in effect (meaning this test can be used) for the duration of the COVID-19 declaration under Section 564(b)(1) of the Act, 21 U.S.C. section 360bbb-3(b)(1), unless the authorization is terminated or revoked.  Performed at [facility], [place] [address] [place], [place] [address]   Comprehensive metabolic panel     Status: Abnormal   Collection Time: 07/23/22 12:02 PM  Result Value Ref Range   Sodium 138 135 - 145 mmol/L   Potassium 4.9 3.5 - 5.1 mmol/L   Chloride 100 98 - 111 mmol/L   CO2 28 22 - 32 mmol/L   Glucose, Bld 89 70 - 99 mg/dL    Comment: Glucose reference range applies only to samples taken after fasting for at least 8 hours.   BUN 52 (H) 8 - 23 mg/dL   Creatinine, Ser 1.76 (H) 0.61 - 1.24 mg/dL   Calcium 8.9 8.9 - 10.3 mg/dL   Total Protein 7.2 6.5 - 8.1 g/dL   Albumin 2.7 (L) 3.5 - 5.0 g/dL   AST 21 15 - 41 U/L   ALT 17 0 - 44 U/L   Alkaline Phosphatase 45 38 - 126 U/L   Total Bilirubin 0.7 0.3 - 1.2 mg/dL   GFR, Estimated 37 (L) >60 mL/min    Comment: (NOTE) Calculated using the CKD-EPI Creatinine Equation (2021)    Anion gap 10 5 - 15    Comment: Performed at [facility] [address], [place], [place] [address]  CBC with Differential     Status: Abnormal   Collection Time: 07/23/22 12:02 PM  Result Value Ref Range   WBC 7.2 4.0 - 10.5 K/uL   RBC 3.21 (L) 4.22 - 5.81 MIL/uL   Hemoglobin 10.7 (L) 13.0 - 17.0 g/dL   HCT 32.3 (L) 39.0 - 52.0 %   MCV 100.6 (H) 80.0 - 100.0 fL   MCH 33.3 26.0 - 34.0 pg   MCHC 33.1 30.0 - 36.0 g/dL   RDW 13.1 11.5 - 15.5 %    Platelets 185 150 - 400 K/uL   nRBC 0.0 0.0 - 0.2 %   Neutrophils Relative % 63 %   Neutro Abs 4.6 1.7 - 7.7 K/uL   Lymphocytes Relative 20 %   Lymphs Abs 1.5 0.7 - 4.0 K/uL   Monocytes Relative 14 %   Monocytes Absolute 1.0 0.1 - 1.0 K/uL   Eosinophils Relative 1 %   Eosinophils Absolute 0.1 0.0 - 0.5 K/uL   Basophils Relative 1 %   Basophils Absolute 0.1 0.0 - 0.1 K/uL   Immature Granulocytes 1 %   Abs Immature Granulocytes 0.06 0.00 - 0.07 K/uL    Comment: Performed at [facility]  [facility], [place] [address] [place], [place] [address]  Lactic acid, plasma     Status: None   Collection Time: 07/23/22 12:02 PM  Result Value Ref Range   Lactic Acid, Venous 1.4 0.5 - 1.9 mmol/L    Comment: Performed at [facility] [address] [place], [place] [address]  Troponin I (High Sensitivity)     Status: None   Collection Time: 07/23/22 12:02 PM  Result Value Ref Range   Troponin I (High Sensitivity) 15 <18 ng/L    Comment: (NOTE) Elevated high sensitivity troponin I (hsTnI) values and significant  changes across serial measurements may suggest ACS but many other  chronic and acute conditions are known to elevate hsTnI results.  Refer to the "Links" section for chest pain algorithms and additional  guidance. Performed at [facility], [place] [address], [place], [place] [address]   Brain natriuretic peptide     Status: Abnormal   Collection Time: 07/23/22 12:02 PM  Result Value Ref Range   B Natriuretic Peptide 119.7 (H) 0.0 - 100.0 pg/mL    Comment: Performed at [facility] [address] [place], [place] [address]    CT Head Wo Contrast  Result Date: 07/23/2022 CLINICAL DATA:  Altered mental status, combative EXAM: CT HEAD WITHOUT CONTRAST TECHNIQUE: Contiguous axial images were obtained from the base of the skull through the vertex without intravenous contrast. RADIATION DOSE REDUCTION: This exam was performed according to the departmental dose-optimization  program which includes automated exposure control, adjustment of the mA and/or kV according to patient size and/or use of iterative reconstruction technique. COMPARISON:  11/11/2021 FINDINGS: Brain: The brainstem, cerebellum, cerebral peduncles, thalami, basal ganglia, basilar cisterns, and ventricular system appear within normal limits. Periventricular white matter and corona radiata hypodensities favor chronic ischemic microvascular white matter disease. No intracranial hemorrhage, mass lesion, or acute CVA. Vascular: Bilateral common carotid atherosclerotic calcification noted in the neck. Skull: Unremarkable Sinuses/Orbits: Chronic bilateral ethmoid and maxillary sinusitis. Other: No supplemental non-categorized findings. IMPRESSION: 1. No acute intracranial findings. 2. Periventricular white matter and corona radiata hypodensities favor chronic ischemic microvascular white matter disease. 3. Atherosclerosis. 4. Chronic bilateral ethmoid and maxillary sinusitis. Electronically Signed   By: Van Clines M.D.   On: 07/23/2022 15:14   CT PELVIS W CONTRAST  Result Date: 07/23/2022 CLINICAL DATA:  Purulent drainage left hip.  Altered mental status. EXAM: CT PELVIS WITH CONTRAST TECHNIQUE: Multidetector CT imaging of the pelvis was performed using the standard protocol following the bolus administration of intravenous contrast. RADIATION DOSE REDUCTION: This exam was performed according to the departmental dose-optimization program which includes automated exposure control, adjustment of the mA and/or kV according to patient size and/or use of iterative reconstruction technique. CONTRAST:  43m OMNIPAQUE IOHEXOL 350 MG/ML SOLN COMPARISON:  CT scan 11/04/2021 FINDINGS: Urinary Tract:  Unremarkable Bowel:  Unremarkable Vascular/Lymphatic: Atherosclerosis is present, including aortoiliac atherosclerotic disease. Left external iliac node 1.2 cm in short axis on image 73 series 6, formerly 1.3 cm. Other mildly  prominent left external iliac nodes are present. Reproductive: Prostatomegaly. Suspected small right scrotal hydrocele. Other:  No supplemental non-categorized findings. Musculoskeletal: Bony lucency along the left acetabulum measuring about 6.1 by 1.9 cm on image 77 series 6, similar appearance on 11/04/2021, appearance favors particulate disease. Infection is a less likely differential diagnostic consideration for this bony lucency given the lack of substantial progression over the last 9 months. Left total hip prosthesis observed, no substantial abnormal lucency  around the stem the femoral component. There is heterotopic ossification posterior to the proximal femur. On images 134 through 147 there is posterior periostitis and cortical thinning along the proximal femoral diaphysis in the vicinity of the linea aspera. Osteomyelitis is not excluded. There is some endosteal demineralization in this vicinity as well as shown on image 159 series 9. Cutaneous wound overlying the greater trochanter on image 94 series 6. Band of associated soft tissue density extends towards the gluteal musculature, trochanteric bursa, and greater trochanter. No new destructive findings or demineralization of the greater trochanter identified. Streak artifact from the hip implant itself reduces signal to noise ratio in the area of interest. Connectivity of the wound to the joint is difficult to totally exclude. IMPRESSION: 1. Cutaneous wound overlying the left greater trochanter, extending towards the trochanteric bursa and greater trochanter region. Connectivity with the left hip joint is not entirely excluded. 2. Periostitis and cortical thinning along the posterior proximal femoral diaphysis in the vicinity of the linea aspera. Appearance raises suspicion for proximal femoral osteomyelitis. 3. Bony lucency along the left acetabulum measuring 6.1 by 1.9 cm, similar appearance on 11/04/2021, appearance favors particulate disease.  Infection is a less likely differential diagnostic consideration for this bony lucency given the lack of substantial progression over the last 9 months. 4. Left total hip prosthesis. 5. Prostatomegaly. 6. Suspected small right scrotal hydrocele. 7. Mildly prominent left external iliac nodes, nonspecific but probably reactive. 8. Aortic atherosclerosis. Aortic Atherosclerosis (ICD10-I70.0). Electronically Signed   By: Van Clines M.D.   On: 07/23/2022 15:08   DG Chest Portable 1 View  Result Date: 07/23/2022 CLINICAL DATA:  Cough and altered mental status.  COVID positive. EXAM: PORTABLE CHEST 1 VIEW COMPARISON:  Chest x-ray dated November 11, 2021. FINDINGS: Unchanged mild cardiomegaly. Mild bibasilar atelectasis. No pleural effusion or pneumothorax. The visualized skeletal structures are unremarkable. Unchanged elevation of the right hemidiaphragm. IMPRESSION: 1. Mild bibasilar atelectasis. Electronically Signed   By: Titus Dubin M.D.   On: 07/23/2022 12:12    Review of Systems  Unable to perform ROS: Mental status change   Blood pressure 130/77, pulse (!) 38, temperature 98 F (36.7 C), temperature source Oral, resp. rate 16, SpO2 92 %. Physical Exam Constitutional:      General: He is not in acute distress.    Appearance: He is well-developed. He is not diaphoretic.  HENT:     Head: Normocephalic and atraumatic.  Eyes:     General: No scleral icterus.       Right eye: No discharge.        Left eye: No discharge.     Conjunctiva/sclera: Conjunctivae normal.  Cardiovascular:     Rate and Rhythm: Normal rate and regular rhythm.  Pulmonary:     Effort: Pulmonary effort is normal. No respiratory distress.  Musculoskeletal:     Cervical back: Normal range of motion.     Comments: LLE Punctate wound lateral hip with purulent discharge, no surrounding erythema, no ecchymosis or rash  Apparent pain with hip motion though hard to say for sure  No knee or ankle effusion  Knee stable to  varus/ valgus and anterior/posterior stress  Sens DPN, SPN, TN could not assess  Motor EHL, ext, flex, evers could not assess  DP 2+, PT 2+, 2+ edema and dorsal erythema (bilaterally)  Skin:    General: Skin is warm and dry.  Neurological:     Mental Status: He is alert.  Psychiatric:  Mood and Affect: Mood normal.        Behavior: Behavior normal.     Assessment/Plan: Left hip wound -- Would definitely treat with abx. Agree with MRI but probably needs hip disarticulation or [place] given chronicity. I can't imagine the implant is not affected. Likely nothing to be done acutely. No WB restrictions.    Lisette Abu, PA-C Orthopedic Surgery [phone] 07/23/2022, 3:43 PM

## 2022-07-23 NOTE — ED Provider Notes (Signed)
[facility] EMERGENCY DEPARTMENT Provider Note   CSN: [id number] Arrival date & time: 07/23/22  1109     History  Chief Complaint  Patient presents with   covid +   Altered Mental Status    Mark Howe is a 87 y.o. male.  With PMH of HTN, HLD, IDA, grade 1 diastolic dysfunction who was brought in by EMS from nursing home with concerns for altered mental status with increased erratic behavior, unruliness and confusion.  He recently tested positive for COVID about 3 days prior per EMS and nursing home.  When speaking to the patient, he says I do not think I need to be seen by another doctor.  I think I am okay.  He is only complaining of pain in his left hip which he says was from[place]  He denies any chest pain, shortness of breath, nausea, vomiting or diarrhea.  He endorses eating and drinking recently.  He continuously asks to go the bathroom and is found to have urine incontinence on himself.  HPI     Home Medications Prior to Admission medications   Medication Sig Start Date End Date Taking? Authorizing Provider  acetaminophen (TYLENOL) 325 MG tablet Take 650 mg by mouth every 6 (six) hours as needed (pain).   Yes [provider]  Amino Acids-Protein Hydrolys (PRO-STAT PO) Take 30 mLs by mouth 2 (two) times daily.   Yes [provider]  bisacodyl (DULCOLAX) 10 MG suppository Place 10 mg rectally daily as needed (constipation not relieved by Milk of Magnesia).   Yes [provider]  citalopram (CELEXA) 20 MG tablet Take 20 mg by mouth daily.   Yes [provider]  divalproex (DEPAKOTE SPRINKLE) 125 MG capsule Take 125 mg by mouth 3 (three) times daily.   Yes [provider]  fexofenadine (ALLEGRA) 180 MG tablet Take 180 mg by mouth at bedtime.   Yes [provider]  guaiFENesin (ROBITUSSIN) 100 MG/5ML liquid Take 200 mg by mouth every 6 (six) hours as needed for cough.   Yes [provider]  ibuprofen (ADVIL) 400 MG tablet Take 400 mg by mouth every 12 (twelve) hours as needed (pain).   Yes [provider]  Magnesium Hydroxide (MILK OF MAGNESIA PO) Take 30 mLs by mouth daily as needed (constipation).   Yes [provider]  memantine (NAMENDA) 10 MG tablet Take 10 mg by mouth at bedtime.   Yes [provider]  Sodium Phosphates (RA SALINE ENEMA RE) Place 1 enema rectally daily as needed (constipation not relieved by bisacodyl suppository).   Yes [provider]  sodium polystyrene (KAYEXALATE) 15 GM/60ML suspension Take 15 g by mouth once.   Yes [provider]  tamsulosin (FLOMAX) 0.4 MG CAPS capsule Take 0.4 mg by mouth at bedtime.   Yes [provider]  traMADol (ULTRAM) 50 MG tablet Take 50 mg by mouth 2 (two) times daily as needed (pain).   Yes [provider]  white petrolatum (VASELINE) OINT Apply 1 application. topically as needed for dry skin. Patient taking differently: Apply 1 application  topically daily as needed for dry skin. 11/17/21  Yes Ghimire, Henreitta Leber, MD      Allergies    Patient has no known allergies.    Review of Systems   Review of Systems  Physical Exam Updated Vital Signs BP 130/77   Pulse (!) 38   Temp 98 F (36.7 C) (Oral)   Resp  16   SpO2 92%  Physical Exam Constitutional: Alert and oriented.x person place not  time not situation Eyes: Conjunctivae are normal. ENT      Head: Normocephalic and atraumatic.      Nose: No congestion.      Mouth/Throat: Mucous membranes are dry.      Neck: No stridor. Cardiovascular: S1, S2, RRR, warm and well perfused Respiratory: Normal respiratory effort.  O2 sat 99 on RA gastrointestinal: Soft and nontender.  Initially found to be sitting in urinary incontinence Musculoskeletal: Normal range of motion in all extremities. Bilateral 2+ pitting edema equal extending from feet to mid shin.  There is erythema of bilateral feet worse  on the right dorsal foot with associated warmth and no active discharge.  Full range of motion intact of bilateral lower extremities.  There is a draining purulent sinus tract wound of the left hip with surrounding erythema. Neurologic: AAO x2.  Normal speech and language.  No facial droop.  Moving all extremities equally.  Sensation grossly intact.  No focal deficits appreciated. Skin: Skin is warm, dry.  Erythema and warmth of bilateral dorsal feet worse on the right foot.  There is a draining left hip purulent sinus tract wound with surrounding erythema. Psychiatric: slightly confused but calm  ED Results / Procedures / Treatments   Labs (all labs ordered are listed, but only abnormal results are displayed) Labs Reviewed  RESP PANEL BY RT-PCR (FLU A&B, COVID) ARPGX2 - Abnormal; Notable for the following components:      Result Value   SARS Coronavirus 2 by RT PCR POSITIVE (*)    All other components within normal limits  COMPREHENSIVE METABOLIC PANEL - Abnormal; Notable for the following components:   BUN 52 (*)    Creatinine, Ser 1.76 (*)    Albumin 2.7 (*)    GFR, Estimated 37 (*)    All other components within normal limits  CBC WITH DIFFERENTIAL/PLATELET - Abnormal; Notable for the following components:   RBC 3.21 (*)    Hemoglobin 10.7 (*)    HCT 32.3 (*)    MCV 100.6 (*)    All other components within normal limits  URINALYSIS, ROUTINE W REFLEX MICROSCOPIC - Abnormal; Notable for the following components:   APPearance HAZY (*)    All other components within normal limits  BRAIN NATRIURETIC PEPTIDE - Abnormal; Notable for the following components:   B Natriuretic Peptide 119.7 (*)    All other components within normal limits  LACTIC ACID, PLASMA  TROPONIN I (HIGH SENSITIVITY)    EKG EKG Interpretation  Date/Time:  Friday July 23 2022 11:45:21 EST Ventricular Rate:  70 PR Interval:  156 QRS Duration: 152 QT Interval:  457 QTC Calculation: 494 R Axis:   54 Text  Interpretation: Sinus rhythm Left bundle branch block No acute changes No Sgarbossas Confirmed by Georgina Snell [phone]) on 07/23/2022 11:59:15 AM  Radiology CT Head Wo Contrast  Result Date: 07/23/2022 CLINICAL DATA:  Altered mental status, combative EXAM: CT HEAD WITHOUT CONTRAST TECHNIQUE: Contiguous axial images were obtained from the base of the skull through the vertex without intravenous contrast. RADIATION DOSE REDUCTION: This exam was performed according to the departmental dose-optimization program which includes automated exposure control, adjustment of the mA and/or kV according to patient size and/or use of iterative reconstruction technique. COMPARISON:  11/11/2021 FINDINGS: Brain: The brainstem, cerebellum, cerebral peduncles, thalami, basal ganglia, basilar cisterns, and ventricular system appear within normal limits. Periventricular white matter and corona radiata hypodensities favor  chronic ischemic microvascular white matter disease. No intracranial hemorrhage, mass lesion, or acute CVA. Vascular: Bilateral common carotid atherosclerotic calcification noted in the neck. Skull: Unremarkable Sinuses/Orbits: Chronic bilateral ethmoid and maxillary sinusitis. Other: No supplemental non-categorized findings. IMPRESSION: 1. No acute intracranial findings. 2. Periventricular white matter and corona radiata hypodensities favor chronic ischemic microvascular white matter disease. 3. Atherosclerosis. 4. Chronic bilateral ethmoid and maxillary sinusitis. Electronically Signed   By: Van Clines M.D.   On: 07/23/2022 15:14   CT PELVIS W CONTRAST  Result Date: 07/23/2022 CLINICAL DATA:  Purulent drainage left hip.  Altered mental status. EXAM: CT PELVIS WITH CONTRAST TECHNIQUE: Multidetector CT imaging of the pelvis was performed using the standard protocol following the bolus administration of intravenous contrast. RADIATION DOSE REDUCTION: This exam was performed according to the  departmental dose-optimization program which includes automated exposure control, adjustment of the mA and/or kV according to patient size and/or use of iterative reconstruction technique. CONTRAST:  33m OMNIPAQUE IOHEXOL 350 MG/ML SOLN COMPARISON:  CT scan 11/04/2021 FINDINGS: Urinary Tract:  Unremarkable Bowel:  Unremarkable Vascular/Lymphatic: Atherosclerosis is present, including aortoiliac atherosclerotic disease. Left external iliac node 1.2 cm in short axis on image 73 series 6, formerly 1.3 cm. Other mildly prominent left external iliac nodes are present. Reproductive: Prostatomegaly. Suspected small right scrotal hydrocele. Other:  No supplemental non-categorized findings. Musculoskeletal: Bony lucency along the left acetabulum measuring about 6.1 by 1.9 cm on image 77 series 6, similar appearance on 11/04/2021, appearance favors particulate disease. Infection is a less likely differential diagnostic consideration for this bony lucency given the lack of substantial progression over the last 9 months. Left total hip prosthesis observed, no substantial abnormal lucency around the stem the femoral component. There is heterotopic ossification posterior to the proximal femur. On images 134 through 147 there is posterior periostitis and cortical thinning along the proximal femoral diaphysis in the vicinity of the linea aspera. Osteomyelitis is not excluded. There is some endosteal demineralization in this vicinity as well as shown on image 159 series 9. Cutaneous wound overlying the greater trochanter on image 94 series 6. Band of associated soft tissue density extends towards the gluteal musculature, trochanteric bursa, and greater trochanter. No new destructive findings or demineralization of the greater trochanter identified. Streak artifact from the hip implant itself reduces signal to noise ratio in the area of interest. Connectivity of the wound to the joint is difficult to totally exclude. IMPRESSION: 1.  Cutaneous wound overlying the left greater trochanter, extending towards the trochanteric bursa and greater trochanter region. Connectivity with the left hip joint is not entirely excluded. 2. Periostitis and cortical thinning along the posterior proximal femoral diaphysis in the vicinity of the linea aspera. Appearance raises suspicion for proximal femoral osteomyelitis. 3. Bony lucency along the left acetabulum measuring 6.1 by 1.9 cm, similar appearance on 11/04/2021, appearance favors particulate disease. Infection is a less likely differential diagnostic consideration for this bony lucency given the lack of substantial progression over the last 9 months. 4. Left total hip prosthesis. 5. Prostatomegaly. 6. Suspected small right scrotal hydrocele. 7. Mildly prominent left external iliac nodes, nonspecific but probably reactive. 8. Aortic atherosclerosis. Aortic Atherosclerosis (ICD10-I70.0). Electronically Signed   By: WVan ClinesM.D.   On: 07/23/2022 15:08   DG Chest Portable 1 View  Result Date: 07/23/2022 CLINICAL DATA:  Cough and altered mental status.  COVID positive. EXAM: PORTABLE CHEST 1 VIEW COMPARISON:  Chest x-ray dated November 11, 2021. FINDINGS: Unchanged mild cardiomegaly. Mild bibasilar atelectasis. No  pleural effusion or pneumothorax. The visualized skeletal structures are unremarkable. Unchanged elevation of the right hemidiaphragm. IMPRESSION: 1. Mild bibasilar atelectasis. Electronically Signed   By: Titus Dubin M.D.   On: 07/23/2022 12:12    Procedures Procedures  Remain on constant cardiac monitoring which I personally  Medications Ordered in ED Medications  cefTRIAXone (ROCEPHIN) 1 g in sodium chloride 0.9 % 100 mL IVPB (has no administration in time range)  vancomycin (VANCOREADY) IVPB 1500 mg/300 mL (has no administration in time range)  vancomycin (VANCOREADY) IVPB 750 mg/150 mL (has no administration in time range)  lactated ringers bolus 1,000 mL (1,000 mLs  Intravenous New Bag/Given 07/23/22 1439)  iohexol (OMNIPAQUE) 350 MG/ML injection 70 mL (70 mLs Intravenous Contrast Given 07/23/22 1445)  OLANZapine (ZYPREXA) injection 5 mg (5 mg Intramuscular Given 07/23/22 1548)    ED Course/ Medical Decision Making/ A&P                           Medical Decision Making Vonte Rossin Pollett is a 87 y.o. male.  With PMH of HTN, HLD, IDA, grade 1 diastolic dysfunction who was brought in by EMS from nursing home with concerns for altered mental status with increased erratic behavior, unruliness and confusion.    The differential for altered mental status is broad and includes, but is not limited to: electrolyte abnormalities (such as hypoglycemia, hypoNa), infection,hyper-uremia, ischemia, among multiple other etiologies.  Patient presented hemodynamically stable and afebrile, unlikely sepsis but has evidence of draining left hip wound and findings on dorsal feet concerning for cellulitis so will obtain infectious work-up with blood cultures. 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 Wynelle Link for this in 2016 and 2018" for which he should be on chronic suppressive antibiotics for.  EKG obtained upon arrival which I personally reviewed which was normal sinus rhythm with a left bundle branch block that is similar to previous EKGs.  No Sgarbossa criteria and no chest pain, unlikely ACS especially with reassuring high sensitive troponin 15.  Chest x-ray obtained which I personally reviewed which showed evidence of cardiomegaly but no focal infiltrate or consolidation concerning for pneumonia and no pleural effusions or pneumothorax.  Based on the patient's presentation, I suspect most likely mixed picture of delirium 2/2 uremia/dehydration with new AKI BUN 52 creatinine 1.76 with BUN to creatinine ratio of 29, COVID encephalopathy with new COVID infection and suspected left hip infection.  His CT scan  reviewed today which showed evidence of possible developing osteomyelitis.  He has normal white blood cell count 7.2 and normal lactate 1.4.  Unlikely to be sepsis.  CT head showed no ICH which I personally reviewed.  Spoke to Orion Crook from orthopedics will have somebody evaluate patient over the weekend to provide formal recommendations but will further order IV antibiotics with vancomycin and Rocephin for coverage and MRI of left hip and femur to further evaluate for osteomyelitis..  Discussed with Dr. Tamala Julian hospitalist who will admit patient for further work-up and management.   Amount and/or Complexity of Data Reviewed Labs: ordered. Radiology: ordered.  Risk Prescription drug management. Decision regarding hospitalization.    Final Clinical Impression(s) / ED Diagnoses Final diagnoses:  Altered mental status, unspecified altered mental status type  COVID-19  AKI (acute kidney injury) ([place])  Complicated open wound of left hip, initial encounter    Rx / DC Orders ED Discharge Orders  None         Elgie Congo, MD 07/23/22 1556

## 2022-07-23 NOTE — H&P (Signed)
History and Physical    Patient: Mark Howe IRS:[id number] DOB: 1934/12/15 DOA: 07/23/2022 DOS: the patient was seen and examined on 07/23/2022 PCP: [name], MD  Patient coming from: [facility] rehab via EMS  Chief Complaint:  Chief Complaint  Patient presents with   covid +   Altered Mental Status   HPI: Mark Howe is a 87 y.o. male with medical history significant of HTN, IDA, HLD, diastolic CHF,  BPH, and dementia who presents for being reported to be acutely altered and agitated not wanting to cooperate with staff.  The patient is unable to give much history.  Denies any complaints at this time.  He had been diagnosed with COVID-19 about 3 days ago and was noted to have a mild cough. Patient had been found to have a  draining wound of the left hip, but did not appear to be on antibiotics for treatment.  Additional  history is obtained from the patient's sister over the phone.  He apparently had a left hip replacement place 11 years ago that would intermittently get better while on antibiotics, but never completely healed. Last admitted into the hospital in March of this year after he was found laying on the floor and didn't know what happened.  Treated for concern for traumatic rhabdomyolysis, but has been unable to walk ever since.  She reports that he has not received his initial COVID-19 vaccines and boosters, but had not received the most recent booster.  In the emergency department patient was noted to be afebrile with blood pressures elevated up to 162/72, and all other vital signs maintained.  Labs noted hemoglobin 10.7, BUN 52,  creatinine 1.76, and BNP 119.7.  Chest x-ray noted mild bibasilar atelectasis.  CT scan of the head did not note any acute abnormality.  CT scan of the pelvis with contrast noted a cutaneous wound overlying left greater trochanter extending towards the trochanteric bursa and greater trochanteric region with periosteitis and cortical thinning  along the posterior proximal femoral diaphysis to give concern for osteomyelitis.  COVID-19 screening was positive.  Orthopedics had been consulted.  Patient has been given 1 L lactated Ringer's, olanzapine 5 mg IM, vancomycin, and Rocephin IV.  MRI of the left hip and femur have been ordered.  Review of Systems: unable to review all systems due to the inability of the patient to answer questions. Past Medical History:  Diagnosis Date   Arthritis    L hip & R knee- OA   Cancer ([place])    basal cell CA- face   Shortness of breath    Past Surgical History:  Procedure Laterality Date   APPENDECTOMY     [facility]- 2011   EYE SURGERY     cataracts removed, laser (bilateral)   NASAL SINUS SURGERY     [facility]- 1980's    TOTAL HIP ARTHROPLASTY  11/24/2011   Procedure: TOTAL HIP ARTHROPLASTY;  Surgeon: Ninetta Lights, MD;  Location: [place];  Service: Orthopedics;  Laterality: Left;   variose     varicose vein surgery   Social History:  reports that he quit smoking about 13 years ago. His smoking use included cigarettes. He has never used smokeless tobacco. He reports that he does not drink alcohol and does not use drugs.  No Known Allergies  Family History  Problem Relation Age of Onset   Anesthesia problems Neg Hx     Prior to Admission medications   Medication Sig Start Date End Date Taking? Authorizing  Provider  acetaminophen (TYLENOL) 325 MG tablet Take 650 mg by mouth every 6 (six) hours as needed (pain).   Yes [provider]  Amino Acids-Protein Hydrolys (PRO-STAT PO) Take 30 mLs by mouth 2 (two) times daily.   Yes [provider]  bisacodyl (DULCOLAX) 10 MG suppository Place 10 mg rectally daily as needed (constipation not relieved by Milk of Magnesia).   Yes [provider]  citalopram (CELEXA) 20 MG tablet Take 20 mg by mouth daily.   Yes [provider]  divalproex (DEPAKOTE SPRINKLE) 125 MG capsule Take 125 mg by mouth 3 (three) times daily.   Yes  [provider]  fexofenadine (ALLEGRA) 180 MG tablet Take 180 mg by mouth at bedtime.   Yes [provider]  guaiFENesin (ROBITUSSIN) 100 MG/5ML liquid Take 200 mg by mouth every 6 (six) hours as needed for cough.   Yes [provider]  ibuprofen (ADVIL) 400 MG tablet Take 400 mg by mouth every 12 (twelve) hours as needed (pain).   Yes [provider]  Magnesium Hydroxide (MILK OF MAGNESIA PO) Take 30 mLs by mouth daily as needed (constipation).   Yes [provider]  memantine (NAMENDA) 10 MG tablet Take 10 mg by mouth at bedtime.   Yes [provider]  Sodium Phosphates (RA SALINE ENEMA RE) Place 1 enema rectally daily as needed (constipation not relieved by bisacodyl suppository).   Yes [provider]  sodium polystyrene (KAYEXALATE) 15 GM/60ML suspension Take 15 g by mouth once.   Yes [provider]  tamsulosin (FLOMAX) 0.4 MG CAPS capsule Take 0.4 mg by mouth at bedtime.   Yes [provider]  traMADol (ULTRAM) 50 MG tablet Take 50 mg by mouth 2 (two) times daily as needed (pain).   Yes [provider]  white petrolatum (VASELINE) OINT Apply 1 application. topically as needed for dry skin. Patient taking differently: Apply 1 application  topically daily as needed for dry skin. 11/17/21  Yes Jonetta Osgood, MD    Physical Exam: Vitals:   07/23/22 1145 07/23/22 1300 07/23/22 1345 07/23/22 1500  BP: (!) 154/77 (!) 162/72 (!) 124/99 130/77  Pulse: 70 76 72 (!) 38  Resp: _0 Temp:      TempSrc:      SpO2: 98% 98% 100% 92%   Exam  Constitutional: Elderly male who appears to be restless Eyes: PERRL, lids and conjunctivae normal ENMT: Mucous membranes are dry. Neck: normal, supple,   Respiratory: Decreased overall aeration with.  Cardiovascular: Regular rate and rhythm with positive systolic murmur.  2+ pitting bilateral lower extremity edema to the knee. Abdomen: no tenderness, no  masses palpated.  Bowel sounds positive.  Musculoskeletal: no clubbing / cyanosis.  No acute joint abnormality. Skin: Erythema noted of the bilateral lower extremities worse on the dorsal aspect of the right foot with increased warmth. Neurologic: CN 2-12 grossly intact.  Appears able to move all extremities. Psychiatric: Altered only oriented to self.  Reports that he thinks he is currently at home and didn't   Data Reviewed:  Sinus rhythm at 70 bpm with QTc 494.  Reviewed labs, imaging and pertinent records as noted above in HPI.  Assessment and Plan:  Acute metabolic encephalopathy Patient was noted to be altered and more irritated than normal.  He was recently diagnosed with COVID-19 about 3 days ago.  CT scan of the head did not note any acute abnormality.  Suspect encephalopathy choreal nature. -Admit  to a telemetry bed -Delirium precautions -Neurochecks -N.p.o. -Speech therapy consulted to evaluate patient's safety swallowing  Left hip postoperative wound infection Acute.  Patient with prior history of left hip replacement over 11 years ago and appears to have a wound of the left hip which had never healed.  CT imaging noted concern for possible osteomyelitis. -Check ESR and CRP -Follow-up MRI imaging of the left hip -Continue empiric antibiotics with Rocephin and vancomycin -Appreciate orthopedic consultative services we will follow-up for any further recommendations  COVID-19 infection Acute.  Patient had been found to be positive for COVID-19 about 3 days ago.  Chest x-ray noted bibasilar atelectasis.  O2 saturations currently maintained on room air. -COVID precautions -Start molnupiravir once able  Possible cellulitis of the right foot Patient was noted to have increased redness of the right dorsal aspect of the foot with increased warmth.    -Orders placed to demarcate area for monitoring -Antibiotics as noted above  Acute kidney injury superimposed on chronic kidney  disease stage IIIa Patient presents with creatinine elevated up to 1.76 with BUN 52.  Baseline creatinine previously had been 1.3.  Elevated BUN to creatinine ratio suggest prerenal cause of symptoms.  Patient has been given 1 L of IV fluids. -Check urine sodium and urine creatinine -Avoid nephrotoxic agents -Recheck kidney function in a.m. -Reassess and determine if medically appropriate to resume IV fluids  Diastolic congestive heart failure Chronic.  Patient was noted to have at least 2+ pitting bilateral lower extremity edema.  BNP was elevated at 119.7.  No priors to compare.  Last echocardiogram noted EF to be 55 to 60% with grade 1 diastolic dysfunction in 11/7104.  Unclear at this time with lower extremity swelling is from third spacing due to low albumin for heart failure exacerbation. -Strict I&Os -Daily weights  Macrocytic anemia Hemoglobin 10.7 which appears around patient's baseline with MCV 100.6. -Recheck CBC tomorrow morning  Dementia without behavioral disturbance Patient has dementia, but sister reports that he normally is aware of person, place and time. -Changed Depakote to IV as patient not safe to swallow at this time -Resume home medications once able  BPH Patient noted to have significant prostatomegaly on CT, but no bladder distention was reported. -Resume Flomax once able  DVT prophylaxis: Lovenox Advance Care Planning:   Code Status: Full Code    Consults: Orthopedics  Family Communication: Sister updated over the phone  Severity of Illness: The appropriate patient status for this patient is INPATIENT. Inpatient status is judged to be reasonable and necessary in order to provide the required intensity of service to ensure the patient's safety. The patient's presenting symptoms, physical exam findings, and initial radiographic and laboratory data in the context of their chronic comorbidities is felt to place them at high risk for further clinical  deterioration. Furthermore, it is not anticipated that the patient will be medically stable for discharge from the hospital within 2 midnights of admission.   * I certify that at the point of admission it is my clinical judgment that the patient will require inpatient hospital care spanning beyond 2 midnights from the point of admission due to high intensity of service, high risk for further deterioration and high frequency of surveillance required.*  Author: Norval Morton, MD 07/23/2022 3:41 PM  For on call review [email or web address].

## 2022-07-23 NOTE — ED Notes (Signed)
This tech went to put a male purewick on pt. Pt is very agitated at this time. When pt is calm this tech will try and apply purewick at that time.

## 2022-07-23 NOTE — ED Triage Notes (Signed)
Patient BIB [facility] from [facility]. Patient tested positive for COVID 3 days ago and per the facility they called EMS because patient is "acting more irritated than usual". Patient is Aox2. Patient's complaint upon questioning at this time is pain in his left hip however he did have hip surgery recently. Pt does smell strongly of urine at this time. He is denying feeling short of breath, but does have a wet cough, congested.   VSS:  BP- 142/78  HR 65  SpO2- 95% RA  CBG 127

## 2022-07-23 NOTE — ED Notes (Signed)
Came to check on pt and he was trying to get out of bed. Pt pulled all his leads off and became a little confused and combative. MD Nechama Guard made aware.

## 2022-07-24 ENCOUNTER — Inpatient Hospital Stay (HOSPITAL_COMMUNITY): Payer: Medicare Other

## 2022-07-24 DIAGNOSIS — T8149XA Infection following a procedure, other surgical site, initial encounter: Secondary | ICD-10-CM | POA: Diagnosis not present

## 2022-07-24 LAB — CBC
HCT: 34.1 % — ABNORMAL LOW (ref 39.0–52.0)
Hemoglobin: 11.3 g/dL — ABNORMAL LOW (ref 13.0–17.0)
MCH: 33.6 pg (ref 26.0–34.0)
MCHC: 33.1 g/dL (ref 30.0–36.0)
MCV: 101.5 fL — ABNORMAL HIGH (ref 80.0–100.0)
Platelets: 190 10*3/uL (ref 150–400)
RBC: 3.36 MIL/uL — ABNORMAL LOW (ref 4.22–5.81)
RDW: 12.9 % (ref 11.5–15.5)
WBC: 8.2 10*3/uL (ref 4.0–10.5)
nRBC: 0 % (ref 0.0–0.2)

## 2022-07-24 LAB — BASIC METABOLIC PANEL
Anion gap: 16 — ABNORMAL HIGH (ref 5–15)
BUN: 41 mg/dL — ABNORMAL HIGH (ref 8–23)
CO2: 21 mmol/L — ABNORMAL LOW (ref 22–32)
Calcium: 8.8 mg/dL — ABNORMAL LOW (ref 8.9–10.3)
Chloride: 100 mmol/L (ref 98–111)
Creatinine, Ser: 1.38 mg/dL — ABNORMAL HIGH (ref 0.61–1.24)
GFR, Estimated: 50 mL/min — ABNORMAL LOW (ref >60)
Glucose, Bld: 91 mg/dL (ref 70–99)
Potassium: 4.9 mmol/L (ref 3.5–5.1)
Sodium: 137 mmol/L (ref 135–145)

## 2022-07-24 MED ORDER — SODIUM CHLORIDE 0.9 % IV SOLN: INTRAVENOUS | Status: DC

## 2022-07-24 MED ORDER — ALBUTEROL SULFATE HFA 108 (90 BASE) MCG/ACT IN AERS
2.0000 | INHALATION_SPRAY | Freq: Two times a day (BID) | RESPIRATORY_TRACT | Status: DC
  Administered 2022-07-25 – 2022-07-29 (×8): 2 via RESPIRATORY_TRACT
  Filled 2022-07-24: qty 6.7

## 2022-07-24 MED ORDER — DEXAMETHASONE SODIUM PHOSPHATE 4 MG/ML IJ SOLN
4.0000 mg | Freq: Two times a day (BID) | INTRAMUSCULAR | Status: DC
  Administered 2022-07-24 – 2022-07-26 (×5): 4 mg via INTRAVENOUS
  Filled 2022-07-24 (×5): qty 1

## 2022-07-24 NOTE — Evaluation (Signed)
Physical Therapy Evaluation Patient Details Name: Mark Howe MRN: [id number] DOB: Dec 06, 1934 Today's Date: 07/24/2022  History of Present Illness  87 y/o male presented to ED on 07/23/22 for AMS from [facility] after being recently dx of COVID. Admitted for acute metabolic encephalopathy and L hip post op wound infection. PMH: HTN, CHF, dementia, CKD  Clinical Impression  Patient admitted with the above. PTA, patient from [facility] but unsure of PLOF due to patient being poor historian. Patient lethargic on arrival and requiring 2L O2. Patient startled on arrival and slightly agitated. Patient requires totalA+2 for all care at bed level. Placed in chair position with increase in LOA but demos wet cough sounds. Will follow for trial basis. Recommend return to SNF.      Recommendations for follow up therapy are one component of a multi-disciplinary discharge planning process, led by the attending physician.  Recommendations may be updated based on patient status, additional functional criteria and insurance authorization.  Follow Up Recommendations Skilled nursing-short term rehab (<3 hours/day) (return to SNF) Can patient physically be transported by private vehicle: No    Assistance Recommended at Discharge Frequent or constant Supervision/Assistance  Patient can return home with the following       Equipment Recommendations None recommended by PT  Recommendations for Other Services       Functional Status Assessment Patient has had a recent decline in their functional status and/or demonstrates limited ability to make significant improvements in function in a reasonable and predictable amount of time     Precautions / Restrictions Precautions Precautions: Fall Precaution Comments: covid, wrist restraints, mittens Restrictions Weight Bearing Restrictions: No      Mobility  Bed Mobility Overal bed mobility: Needs Assistance             General bed mobility  comments: bed to chair position only for safety    Transfers                   General transfer comment: deferred    Ambulation/Gait                  Stairs            Wheelchair Mobility    Modified Rankin (Stroke Patients Only)       Balance                                             Pertinent Vitals/Pain Pain Assessment Faces Pain Scale: Hurts even more Pain Location: hip? generalized Pain Descriptors / Indicators: Discomfort, Grimacing Pain Intervention(s): Monitored during session, Limited activity within patient's tolerance    Home Living Family/patient expects to be discharged to:: Skilled nursing facility                        Prior Function Prior Level of Function : Patient poor historian/Family not available;History of Falls (last six months)                     Hand Dominance   Dominant Hand: Right    Extremity/Trunk Assessment   Upper Extremity Assessment Upper Extremity Assessment: Defer to OT evaluation    Lower Extremity Assessment Lower Extremity Assessment: Generalized weakness;LLE deficits/detail LLE Deficits / Details: L hip painful with any attempts at movement. Difficult to fully assess LLE: Unable to  fully assess due to pain    Cervical / Trunk Assessment Cervical / Trunk Assessment: Kyphotic  Communication   Communication: Other (comment);Expressive difficulties (mumbles incoherently)  Cognition Arousal/Alertness: Lethargic Behavior During Therapy: Restless Overall Cognitive Status: History of cognitive impairments - at baseline                                 General Comments: alseep upon arrival, some agitation noted with startle. otherwise pleasantly confused. did not follow commands. eyes closed the majority of the session. oriented to his name only        General Comments General comments (skin integrity, edema, etc.): VSS on 2L O2 [place]    Exercises      Assessment/Plan    PT Assessment Patient needs continued PT services  PT Problem List Decreased strength;Decreased activity tolerance;Decreased mobility;Decreased balance;Decreased cognition;Decreased coordination;Decreased knowledge of use of DME;Decreased safety awareness;Decreased knowledge of precautions;Cardiopulmonary status limiting activity       PT Treatment Interventions DME instruction;Functional mobility training;Therapeutic exercise;Therapeutic activities;Balance training;Patient/family education    PT Goals (Current goals can be found in the Care Plan section)  Acute Rehab PT Goals Patient Stated Goal: did not state PT Goal Formulation: Patient unable to participate in goal setting Time For Goal Achievement: 08/07/22 Potential to Achieve Goals: Poor    Frequency Min 2X/week     Co-evaluation               AM-PAC PT "6 Clicks" Mobility  Outcome Measure Help needed turning from your back to your side while in a flat bed without using bedrails?: Total Help needed moving from lying on your back to sitting on the side of a flat bed without using bedrails?: Total Help needed moving to and from a bed to a chair (including a wheelchair)?: Total Help needed standing up from a chair using your arms (e.g., wheelchair or bedside chair)?: Total Help needed to walk in hospital room?: Total Help needed climbing 3-5 steps with a railing? : Total 6 Click Score: 6    End of Session Equipment Utilized During Treatment: Oxygen Activity Tolerance: Patient limited by lethargy Patient left: in bed;with call bell/phone within reach;with bed alarm set;with restraints reapplied Nurse Communication: Mobility status PT Visit Diagnosis: Muscle weakness (generalized) (M62.81);Unsteadiness on feet (R26.81);Difficulty in walking, not elsewhere classified (R26.2)    Time: 1359-1416 PT Time Calculation (min) (ACUTE ONLY): 17 min   Charges:   PT Evaluation $PT Eval Moderate  Complexity: 1 Mod          Anuja Manka A. Gilford Rile PT, DPT Acute Rehabilitation Services Office [phone]   Linna Hoff 07/24/2022, 3:14 PM

## 2022-07-24 NOTE — Progress Notes (Signed)
SLP Cancellation Note  Patient Details Name: Mark Howe MRN: [id number] DOB: 04-07-1935   Cancelled treatment:       Reason Eval/Treat Not Completed: Medical issues which prohibited therapy. Per RN, pt very lethargic and with O2 sats 80-81. She is presently placing pt on oxygen. SLP will f/u, as able, later this date for completion of swallow eval.     [name], [place], [place] Office Number: [phone]  Acie Fredrickson 07/24/2022, 10:15 AM

## 2022-07-24 NOTE — Progress Notes (Signed)
SLP Cancellation Note  Patient Details Name: Mark Howe MRN: [id number] DOB: 1935-03-06   Cancelled treatment:       Reason Eval/Treat Not Completed: Fatigue/lethargy limiting ability to participate. Attempted swallow eval with pt who was not able to maintain alertness for more than a few seconds despite max cues/positioning/ect. Will f/u on subsequent date.     [name], [place], [place] Acute Rehabilitation Services Office Number: [phone]  Acie Fredrickson 07/24/2022, 12:45 PM

## 2022-07-24 NOTE — Evaluation (Signed)
Occupational Therapy Evaluation Patient Details Name: Mark Howe MRN: [id number] DOB: 1935/07/07 Today's Date: 07/24/2022   History of Present Illness 87 y/o male presented to ED on 07/23/22 for AMS from [facility] after being recently dx of COVID. Admitted for acute metabolic encephalopathy and L hip post op wound infection. PMH: HTN, CHF, dementia, CKDq   Clinical Impression   Pt was evaluated s/p the above admission list, he is from SNF. Pt presents with impaired cognition and decreased LOA, he was unable to report his PLOF at SNF. Upon arrival he was sleeping and became mildly agitated with startle response. Overall he is total A +2 for all aspects of his care at bed level. For safety, pt was moved into chair position in bed with some increase in LOA and communication attempts (mumbled and difficult to understand). Pt reporting pain in L hip and with very wet cough sounds. OT to continue to follow on trial basis. Recommend d/c back to SNF.      Recommendations for follow up therapy are one component of a multi-disciplinary discharge planning process, led by the attending physician.  Recommendations may be updated based on patient status, additional functional criteria and insurance authorization.   Follow Up Recommendations  Skilled nursing-short term rehab (<3 hours/day)    Assistance Recommended at Discharge Frequent or constant Supervision/Assistance  Patient can return home with the following A lot of help with walking and/or transfers;A lot of help with bathing/dressing/bathroom;Assistance with cooking/housework;Assistance with feeding;Direct supervision/assist for medications management;Direct supervision/assist for financial management;Help with stairs or ramp for entrance;Assist for transportation    Functional Status Assessment  Patient has had a recent decline in their functional status and/or demonstrates limited ability to make significant improvements in function in a  reasonable and predictable amount of time  Equipment Recommendations  None recommended by OT    Recommendations for Other Services       Precautions / Restrictions Precautions Precautions: Fall Precaution Comments: covid Restrictions Weight Bearing Restrictions: No      Mobility Bed Mobility Overal bed mobility: Needs Assistance             General bed mobility comments: bed to chair position only for safety    Transfers                   General transfer comment: deferred      Balance                                           ADL either performed or assessed with clinical judgement   ADL Overall ADL's : Needs assistance/impaired                                       General ADL Comments: total A for all aspects of care at bed level     Vision Baseline Vision/History: 0 No visual deficits Additional Comments: difficult to assess. eyes closed majority of the session     Perception     Praxis      Pertinent Vitals/Pain Pain Assessment Pain Assessment: Faces Faces Pain Scale: Hurts even more Pain Location: hip? generalized Pain Descriptors / Indicators: Discomfort, Grimacing Pain Intervention(s): Limited activity within patient's tolerance, Monitored during session     Hand Dominance Right   Extremity/Trunk Assessment  Upper Extremity Assessment Upper Extremity Assessment: Generalized weakness;Difficult to assess due to impaired cognition   Lower Extremity Assessment Lower Extremity Assessment: Defer to PT evaluation   Cervical / Trunk Assessment Cervical / Trunk Assessment: Kyphotic   Communication Communication Communication: Other (comment);Expressive difficulties (mumbles incoherently)   Cognition Arousal/Alertness: Lethargic Behavior During Therapy: Restless Overall Cognitive Status: History of cognitive impairments - at baseline                                 General Comments:  alseep upon arrival, some agitation noted with startle. otherwise pleasantly confused. did not follow commands. eyes closed the majority of the session. oriented to his name only     General Comments  VSS on 2 L. mitts on wrist restraints applied    Exercises     Shoulder Instructions      Home Living Family/patient expects to be discharged to:: Skilled nursing facility                                        Prior Functioning/Environment Prior Level of Function : Patient poor historian/Family not available;History of Falls (last six months)                        OT Problem List: Decreased strength;Decreased activity tolerance;Decreased range of motion;Impaired balance (sitting and/or standing);Decreased safety awareness;Decreased knowledge of use of DME or AE;Decreased knowledge of precautions;Pain      OT Treatment/Interventions: Self-care/ADL training;Therapeutic exercise;DME and/or AE instruction;Patient/family education;Balance training;Therapeutic activities    OT Goals(Current goals can be found in the care plan section) Acute Rehab OT Goals Patient Stated Goal: unable to state OT Goal Formulation: Patient unable to participate in goal setting Time For Goal Achievement: 08/07/22 Potential to Achieve Goals: Good ADL Goals Pt Will Perform Grooming: with mod assist;bed level Pt Will Perform Upper Body Dressing: with mod assist;sitting Additional ADL Goal #1: Pt will complete bed mobility with mod A as a precursor to ADLs Additional ADL Goal #2: pt will follow simple 1 step commands 50% of the session  OT Frequency: Min 2X/week    Co-evaluation              AM-PAC OT "6 Clicks" Daily Activity     Outcome Measure Help from another person eating meals?: Total Help from another person taking care of personal grooming?: Total Help from another person toileting, which includes using toliet, bedpan, or urinal?: Total Help from another person  bathing (including washing, rinsing, drying)?: Total Help from another person to put on and taking off regular upper body clothing?: Total Help from another person to put on and taking off regular lower body clothing?: Total 6 Click Score: 6   End of Session Equipment Utilized During Treatment: Oxygen Nurse Communication: Mobility status;Other (comment) (wet coughing sounds)  Activity Tolerance: Patient limited by lethargy Patient left: in bed;with call bell/phone within reach;with bed alarm set;with restraints reapplied  OT Visit Diagnosis: Unsteadiness on feet (R26.81);Other abnormalities of gait and mobility (R26.89);Muscle weakness (generalized) (M62.81);Pain                Time: 1359-1416 OT Time Calculation (min): 17 min Charges:  OT General Charges $OT Visit: 1 Visit OT Evaluation $OT Eval Moderate Complexity: 1 Mod    Cameryn Chrisley D Causey 07/24/2022, 2:53 PM

## 2022-07-24 NOTE — Progress Notes (Signed)
Pt brought back to MRI for next attempt at obtaining ordered exams. Pt relatively calm upon arrival still with infrequent leg movement but large improvement from last attempt. Prepared pt for exam and began scanning. As soon as scanner would start making noise pt would immediately raise up head looking around and continuously tensing up and raising up arms and legs with force. Unable to obtain any remotely diagnostic images due to extreme motion degradation. Pt removed from scanner and sent back to room. Unable to obtain MRI in pt's current state.

## 2022-07-24 NOTE — Progress Notes (Signed)
Pt brought down to MRI for exams after being medicated for exams by RN. Pt arrived shaking whole body continuously with infrequent full body jerking, tensing up and attempting to jerk arms up from restraints. Gave meds ~10 min following arrival to department to hopefully take effect but continuous movement and jerking remained. Movement most present in legs which is specific area needing to be scanned. Unable to obtain diagnostic images in pt's current state as pt will need to remain still for exam. Pt sent back to room via pt transport.

## 2022-07-24 NOTE — Progress Notes (Signed)
PROGRESS NOTE   Mark Howe  GYI:[id number] DOB: [birth year] DOA: 07/23/2022 PCP: [name], MD  Brief Narrative:  87 [facility] old [facility] resident HTN HLD BPH with hematuria Prior pressure ulcers and admission in 11/2021 for rhabdo after having been found down chronic draining left total hip arthroplasty incision seen in 2016 2018 for this-had surgery about 11 years ago-this never really healed  Allegedly tested positive for COVID around 11/8 at facility also with left hip pain-was acting more irritable than usual erratic and confused BUNs/creatinine 52/1.7 -Lactic acid 1.4, WBC 7.2 hemoglobin 10.7 MCV 100.6 platelets 185, CRP elevated CT showed no intracranial findings periventricular white matter corona radiata hypodensities favoring chronic ischemia CXR = mild bibasilar atelectasis Valproic acid level was 28 Orthopedics was consulted and recommended MRI of the left hip from-speech therapy to see patient for safety with regards to diet Patient was started on Rocephin and vancomycin empirically for coverage   Hospital-Problem based course  Hypoxic respiratory failure COVID-19 -Needing further oxygen unclear if this is secondary to the COVID or having received Ativan and other anxiolytics causing him not to be able to take deep breaths -Try to reorient as best possible continue molnupiravir, start Decadron IV 4 mg every 12  Draining left hip, CT shows cutaneous wound over left greater trochanteric towards the bursa?  Osteo -Treat empirically with antibiotics-orthopedic has seen - May require large surgery given chronicity per them-await MRI of hip to determine if osteo, but may not be able to get it given lack of patient cooperation - Continue tramadol 50 twice daily pain if severe, tylenol as first choice - Continue vancomycin and ceftriaxone for osteomyelitis -Give saline 75 cc/H  AKI superimposed on CKD 3 - Baseline creatinine 1.3 and volume depleted on  admission - Started on saline 75 cc/H  Toxic metabolic encephalopathy hilus secondary to infection or underlying dementia superimposed by acute kidney injury -needed to be in restraints-monitor carefully -try to avoid Overmedicating  Dementia with behavioral disturbances - Usually does not have issues with behaviors - Depakote was changed to IV-  DVT prophylaxis:  Code Status:  Family Communication: Guinevere Ferrari sister updated [phone] Disposition:  Status is: Inpatient Remains inpatient appropriate because:   Sick and not stable at this time   Consultants:  Ortho  Procedures: none  Antimicrobials: molnupiravir    Subjective:  Cannot orietn Note sats drops and needs ocygen. No fever no chills Unclear if can take meds  Objective: Vitals:   07/23/22 1826 07/23/22 1933 07/23/22 2357 07/24/22 0454  BP: (!) 154/77 (!) 160/80 (!) 147/101 (!) 154/73  Pulse: 90 86 95 88  Resp:    (!) 21  Temp: 98.5 F (36.9 C) 98.5 F (36.9 C) 98.3 F (36.8 C) 98.4 F (36.9 C)  TempSrc: Axillary Axillary Axillary   SpO2: 97%   97%    Intake/Output Summary (Last 24 hours) at 07/24/2022 9937 Last data filed at 07/24/2022 0500 Gross per 24 hour  Intake --  Output 500 ml  Net -500 ml   There were no vitals filed for this visit.  Examination:  Eomi ncat no ict no pallor Cta b no rales wheeze Abd soft nt nd no rebound S1 s2 no m/r/g RRR Neurologically cannot test adequately L Hip exam attempted but he was in too much pain to adeqautely examine   Data Reviewed: personally reviewed   CBC    Component Value Date/Time   WBC 8.2 07/24/2022 0132   RBC 3.36 (L) 07/24/2022 0132  HGB 11.3 (L) 07/24/2022 0132   HCT 34.1 (L) 07/24/2022 0132   PLT 190 07/24/2022 0132   MCV 101.5 (H) 07/24/2022 0132   MCH 33.6 07/24/2022 0132   MCHC 33.1 07/24/2022 0132   RDW 12.9 07/24/2022 0132   LYMPHSABS 1.5 07/23/2022 1202   MONOABS 1.0 07/23/2022 1202   EOSABS 0.1 07/23/2022 1202    BASOSABS 0.1 07/23/2022 1202      Latest Ref Rng & Units 07/24/2022    1:32 AM 07/23/2022   12:02 PM 12/29/2021   12:00 AM  CMP  Glucose 70 - 99 mg/dL 91  89    BUN 8 - 23 mg/dL 41  52  28      Creatinine 0.61 - 1.24 mg/dL 1.38  1.76  1.3      Sodium 135 - 145 mmol/L 137  138  136      Potassium 3.5 - 5.1 mmol/L 4.9  4.9  4.7      Chloride 98 - 111 mmol/L 100  100  103      CO2 22 - 32 mmol/L '21  28  26      '$ Calcium 8.9 - 10.3 mg/dL 8.8  8.9  8.6      Total Protein 6.5 - 8.1 g/dL  7.2    Total Bilirubin 0.3 - 1.2 mg/dL  0.7    Alkaline Phos 38 - 126 U/L  45    AST 15 - 41 U/L  21    ALT 0 - 44 U/L  17       This result is from an external source.     Radiology Studies: CT Head Wo Contrast  Result Date: 07/23/2022 CLINICAL DATA:  Altered mental status, combative EXAM: CT HEAD WITHOUT CONTRAST TECHNIQUE: Contiguous axial images were obtained from the base of the skull through the vertex without intravenous contrast. RADIATION DOSE REDUCTION: This exam was performed according to the departmental dose-optimization program which includes automated exposure control, adjustment of the mA and/or kV according to patient size and/or use of iterative reconstruction technique. COMPARISON:  11/11/2021 FINDINGS: Brain: The brainstem, cerebellum, cerebral peduncles, thalami, basal ganglia, basilar cisterns, and ventricular system appear within normal limits. Periventricular white matter and corona radiata hypodensities favor chronic ischemic microvascular white matter disease. No intracranial hemorrhage, mass lesion, or acute CVA. Vascular: Bilateral common carotid atherosclerotic calcification noted in the neck. Skull: Unremarkable Sinuses/Orbits: Chronic bilateral ethmoid and maxillary sinusitis. Other: No supplemental non-categorized findings. IMPRESSION: 1. No acute intracranial findings. 2. Periventricular white matter and corona radiata hypodensities favor chronic ischemic microvascular white  matter disease. 3. Atherosclerosis. 4. Chronic bilateral ethmoid and maxillary sinusitis. Electronically Signed   By: Van Clines M.D.   On: 07/23/2022 15:14   CT PELVIS W CONTRAST  Result Date: 07/23/2022 CLINICAL DATA:  Purulent drainage left hip.  Altered mental status. EXAM: CT PELVIS WITH CONTRAST TECHNIQUE: Multidetector CT imaging of the pelvis was performed using the standard protocol following the bolus administration of intravenous contrast. RADIATION DOSE REDUCTION: This exam was performed according to the departmental dose-optimization program which includes automated exposure control, adjustment of the mA and/or kV according to patient size and/or use of iterative reconstruction technique. CONTRAST:  31m OMNIPAQUE IOHEXOL 350 MG/ML SOLN COMPARISON:  CT scan 11/04/2021 FINDINGS: Urinary Tract:  Unremarkable Bowel:  Unremarkable Vascular/Lymphatic: Atherosclerosis is present, including aortoiliac atherosclerotic disease. Left external iliac node 1.2 cm in short axis on image 73 series 6, formerly 1.3 cm. Other mildly prominent left external iliac nodes  are present. Reproductive: Prostatomegaly. Suspected small right scrotal hydrocele. Other:  No supplemental non-categorized findings. Musculoskeletal: Bony lucency along the left acetabulum measuring about 6.1 by 1.9 cm on image 77 series 6, similar appearance on 11/04/2021, appearance favors particulate disease. Infection is a less likely differential diagnostic consideration for this bony lucency given the lack of substantial progression over the last 9 months. Left total hip prosthesis observed, no substantial abnormal lucency around the stem the femoral component. There is heterotopic ossification posterior to the proximal femur. On images 134 through 147 there is posterior periostitis and cortical thinning along the proximal femoral diaphysis in the vicinity of the linea aspera. Osteomyelitis is not excluded. There is some endosteal  demineralization in this vicinity as well as shown on image 159 series 9. Cutaneous wound overlying the greater trochanter on image 94 series 6. Band of associated soft tissue density extends towards the gluteal musculature, trochanteric bursa, and greater trochanter. No new destructive findings or demineralization of the greater trochanter identified. Streak artifact from the hip implant itself reduces signal to noise ratio in the area of interest. Connectivity of the wound to the joint is difficult to totally exclude. IMPRESSION: 1. Cutaneous wound overlying the left greater trochanter, extending towards the trochanteric bursa and greater trochanter region. Connectivity with the left hip joint is not entirely excluded. 2. Periostitis and cortical thinning along the posterior proximal femoral diaphysis in the vicinity of the linea aspera. Appearance raises suspicion for proximal femoral osteomyelitis. 3. Bony lucency along the left acetabulum measuring 6.1 by 1.9 cm, similar appearance on 11/04/2021, appearance favors particulate disease. Infection is a less likely differential diagnostic consideration for this bony lucency given the lack of substantial progression over the last 9 months. 4. Left total hip prosthesis. 5. Prostatomegaly. 6. Suspected small right scrotal hydrocele. 7. Mildly prominent left external iliac nodes, nonspecific but probably reactive. 8. Aortic atherosclerosis. Aortic Atherosclerosis (ICD10-I70.0). Electronically Signed   By: Van Clines M.D.   On: 07/23/2022 15:08   DG Chest Portable 1 View  Result Date: 07/23/2022 CLINICAL DATA:  Cough and altered mental status.  COVID positive. EXAM: PORTABLE CHEST 1 VIEW COMPARISON:  Chest x-ray dated November 11, 2021. FINDINGS: Unchanged mild cardiomegaly. Mild bibasilar atelectasis. No pleural effusion or pneumothorax. The visualized skeletal structures are unremarkable. Unchanged elevation of the right hemidiaphragm. IMPRESSION: 1. Mild  bibasilar atelectasis. Electronically Signed   By: Titus Dubin M.D.   On: 07/23/2022 12:12     Scheduled Meds:  albuterol  2 puff Inhalation Q6H   vitamin C  500 mg Oral Daily   citalopram  20 mg Oral Daily   enoxaparin (LOVENOX) injection  30 mg Subcutaneous Q24H   memantine  10 mg Oral QHS   molnupiravir EUA  4 capsule Oral BID   sodium chloride flush  3 mL Intravenous Q12H   tamsulosin  0.4 mg Oral QHS   zinc sulfate  220 mg Oral Daily   Continuous Infusions:  cefTRIAXone (ROCEPHIN)  IV 2 g (07/23/22 1652)   valproate sodium 190 mg (07/24/22 0624)   vancomycin       LOS: 1 day   Time spent: 58  Nita Sells, MD [facility] To contact the attending provider between 7A-7P or the covering provider during after hours 7P-7A, please log into the web site [email or web address] and access using universal [place] password for that web site. If you do not have the password, please call the hospital operator.  07/24/2022, 7:33 AM

## 2022-07-25 ENCOUNTER — Inpatient Hospital Stay: Payer: Self-pay

## 2022-07-25 DIAGNOSIS — T8452XA Infection and inflammatory reaction due to internal left hip prosthesis, initial encounter: Secondary | ICD-10-CM

## 2022-07-25 DIAGNOSIS — T8149XA Infection following a procedure, other surgical site, initial encounter: Secondary | ICD-10-CM | POA: Diagnosis not present

## 2022-07-25 MED ORDER — SODIUM CHLORIDE 0.9% FLUSH
10.0000 mL | Freq: Two times a day (BID) | INTRAVENOUS | Status: DC
  Administered 2022-07-26 – 2022-07-29 (×4): 10 mL

## 2022-07-25 MED ORDER — CHLORHEXIDINE GLUCONATE CLOTH 2 % EX PADS
6.0000 | MEDICATED_PAD | Freq: Every day | CUTANEOUS | Status: DC
  Administered 2022-07-26 – 2022-07-29 (×4): 6 via TOPICAL

## 2022-07-25 MED ORDER — SODIUM CHLORIDE 0.9% FLUSH: 10.0000 mL | INTRAVENOUS | Status: DC | PRN

## 2022-07-25 MED ORDER — SODIUM CHLORIDE 0.9 % IV SOLN
8.0000 mg/kg | Freq: Every day | INTRAVENOUS | Status: DC
  Administered 2022-07-26 – 2022-07-28 (×3): 650 mg via INTRAVENOUS
  Filled 2022-07-25 (×4): qty 13

## 2022-07-25 MED ORDER — SODIUM CHLORIDE 0.9 % IV SOLN
8.0000 mg/kg | Freq: Every day | INTRAVENOUS | Status: AC
  Administered 2022-07-25: 600 mg via INTRAVENOUS
  Filled 2022-07-25: qty 12

## 2022-07-25 MED ORDER — ENOXAPARIN SODIUM 40 MG/0.4ML IJ SOSY
40.0000 mg | PREFILLED_SYRINGE | INTRAMUSCULAR | Status: DC
  Administered 2022-07-25 – 2022-07-28 (×3): 40 mg via SUBCUTANEOUS
  Filled 2022-07-25 (×4): qty 0.4

## 2022-07-25 NOTE — Consult Note (Addendum)
[facility]ious [facility]  Total days of antibiotics 3               Reason for Consult:left hip pji    Referring Physician: samtani  Principal Problem:   Left hip postoperative wound infection Active Problems:   Benign prostatic hyperplasia   Acute kidney injury superimposed on chronic kidney disease ([place])   Acute metabolic encephalopathy   COVID-19 virus infection   Chronic diastolic CHF (congestive heart failure) (HCC)   Cellulitis of foot   Macrocytic anemia   Dementia without behavioral disturbance (HCC)    HPI: Mark Howe is a 87 y.o. male,  a SNFT resident with dementia, hx of HTN, BPH, hx of left THA with chronic left hip draining wound. Reported chronic wound where he was intermittently on abtx x 10 years, never healed. He was admitted on 11/10 for worsening AMS, hypoxia, found to have 83% on RA by pulse ox but CXR only showing mild bibasilar atelectasis. He does have pain with weight bearing to left leg, CT of pelvis showed cutaneous wound overlying left greater trochnater extending toward bursa with periostetitis. He was unable to do MRI due to agitation. 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 Started on molnupiravir for covid-19 pneumonia. The patient remains afebrile, but still requiring supplemental oxygen. He has had 2 large bm but not liquidy thus far in his admission. Orthopedics recommend that he would need girdlestone/hip disarticulation for source control.   Past Medical History:  Diagnosis Date   Arthritis    L hip & R knee- OA   Cancer ([place])    basal cell CA- face   Shortness of breath     Allergies: No Known Allergies  Current antibiotics:   MEDICATIONS:  albuterol  2 puff Inhalation BID   dexamethasone (DECADRON) injection  4 mg Intravenous Q12H   enoxaparin (LOVENOX) injection  30 mg Subcutaneous Q24H   sodium chloride flush  3 mL Intravenous Q12H    tamsulosin  0.4 mg Oral QHS    Social History   Tobacco Use   Smoking status: Former    Types: Cigarettes    Quit date: 11/18/2008    Years since quitting: 13.6   Smokeless tobacco: Never  Substance Use Topics   Alcohol use: No   Drug use: No    Family History  Problem Relation Age of Onset   Anesthesia problems Neg Hx      Review of Systems  Constitutional: Negative for fever, chills, diaphoresis, activity change, appetite change, fatigue and unexpected weight change.  HENT: Negative for congestion, sore throat, rhinorrhea, sneezing, trouble swallowing and sinus pressure.  Eyes: Negative for photophobia and visual disturbance.  Respiratory: Negative for cough, chest tightness, shortness of breath, wheezing and stridor.  Cardiovascular: Negative for chest pain, palpitations and leg swelling.  Gastrointestinal: Negative for nausea, vomiting, abdominal pain, diarrhea, constipation, blood in stool, abdominal distention and anal bleeding.  Genitourinary: Negative for dysuria, hematuria, flank pain and difficulty urinating.  Musculoskeletal: left hip pain. Negative for myalgias, back pain, joint swelling, arthralgias and gait problem.  Skin: Negative for color change, pallor, rash and wound.  Neurological: Negative for dizziness, tremors, weakness and light-headedness.  Hematological: Negative for adenopathy. Does not bruise/bleed easily.  Psychiatric/Behavioral: Negative for behavioral problems, confusion, sleep disturbance, dysphoric mood, decreased concentration and agitation.    OBJECTIVE: Temp:  [97.4 F (36.3 C)-98 F (36.7 C)]  97.7 F (36.5 C) (11/12 1406) Pulse Rate:  [53-69] 69 (11/12 0500) Resp:  [17-18] 18 (11/12 1406) BP: (121-168)/(50-93) 138/76 (11/12 1406) SpO2:  [98 %-99 %] 98 % (11/12 1406) Physical Exam  Constitutional: He is oriented to person, only. He appears well-developed and well-nourished. No distress.  HENT:  Mouth/Throat: Oropharynx is clear and  moist. No oropharyngeal exudate.  Cardiovascular: Normal rate, regular rhythm and normal heart sounds. Exam reveals no gallop and no friction rub.  No murmur heard.  Pulmonary/Chest: Effort normal and breath sounds normal. No respiratory distress. He has no wheezes.  Abdominal: Soft. Bowel sounds are normal. He exhibits no distension. There is no tenderness.  Left hip wound+ purulence Neurological: He is alert and oriented to person, only Skin: Skin is warm and dry. No rash noted. No erythema.  Psychiatric: agitated but can calm down with reassurance   LABS: Results for orders placed or performed during the hospital encounter of 07/23/22 (from the past 48 hour(s))  Valproic acid level     Status: Abnormal   Collection Time: 07/23/22  5:51 PM  Result Value Ref Range   Valproic Acid Lvl 28 (L) 50.0 - 100.0 ug/mL    Comment: Performed at [facility], [address] [place], [place] [address]  C-reactive protein     Status: Abnormal   Collection Time: 07/23/22  5:51 PM  Result Value Ref Range   CRP 10.3 (H) <1.0 mg/dL    Comment: Performed at [facility], [place] [address], [place], [place] [address]  Sedimentation rate     Status: Abnormal   Collection Time: 07/23/22  9:55 PM  Result Value Ref Range   Sed Rate 107 (H) 0 - 16 mm/hr    Comment: Performed at [facility] [address] [place], [place] [address]  CBC     Status: Abnormal   Collection Time: 07/24/22  1:32 AM  Result Value Ref Range   WBC 8.2 4.0 - 10.5 K/uL   RBC 3.36 (L) 4.22 - 5.81 MIL/uL   Hemoglobin 11.3 (L) 13.0 - 17.0 g/dL   HCT 34.1 (L) 39.0 - 52.0 %   MCV 101.5 (H) 80.0 - 100.0 fL   MCH 33.6 26.0 - 34.0 pg   MCHC 33.1 30.0 - 36.0 g/dL   RDW 12.9 11.5 - 15.5 %   Platelets 190 150 - 400 K/uL   nRBC 0.0 0.0 - 0.2 %    Comment: Performed at [facility], [place] [address], [place], [place] [address]  Basic metabolic panel     Status: Abnormal   Collection Time: 07/24/22  1:32 AM  Result  Value Ref Range   Sodium 137 135 - 145 mmol/L   Potassium 4.9 3.5 - 5.1 mmol/L   Chloride 100 98 - 111 mmol/L   CO2 21 (L) 22 - 32 mmol/L   Glucose, Bld 91 70 - 99 mg/dL    Comment: Glucose reference range applies only to samples taken after fasting for at least 8 hours.   BUN 41 (H) 8 - 23 mg/dL   Creatinine, Ser 1.38 (H) 0.61 - 1.24 mg/dL   Calcium 8.8 (L) 8.9 - 10.3 mg/dL   GFR, Estimated 50 (L) >60 mL/min    Comment: (NOTE) Calculated using the CKD-EPI Creatinine Equation (2021)    Anion gap 16 (H) 5 - 15    Comment: Performed at [facility] [address] [place],  [address]  MICRO: reviewed IMAGING: CT Head Wo Contrast  Result Date: 07/23/2022 CLINICAL DATA:  Altered mental status, combative EXAM: CT HEAD WITHOUT CONTRAST TECHNIQUE: Contiguous axial images were obtained from the base of the skull through the vertex without intravenous contrast. RADIATION DOSE REDUCTION: This exam was performed according to the departmental dose-optimization program which includes automated exposure control, adjustment of the mA and/or kV according to patient size and/or use of iterative reconstruction technique. COMPARISON:  11/11/2021 FINDINGS: Brain: The brainstem, cerebellum, cerebral peduncles, thalami, basal ganglia, basilar cisterns, and ventricular system appear within normal limits. Periventricular white matter and corona radiata hypodensities favor chronic ischemic microvascular white matter disease. No intracranial hemorrhage, mass lesion, or acute CVA. Vascular: Bilateral common carotid atherosclerotic calcification noted in the neck. Skull: Unremarkable Sinuses/Orbits: Chronic bilateral ethmoid and maxillary sinusitis. Other: No supplemental non-categorized findings. IMPRESSION: 1. No acute intracranial findings. 2. Periventricular white matter and corona radiata hypodensities favor chronic ischemic microvascular white matter disease. 3. Atherosclerosis. 4. Chronic  bilateral ethmoid and maxillary sinusitis. Electronically Signed   By: Van Clines M.D.   On: 07/23/2022 15:14   CT PELVIS W CONTRAST  Result Date: 07/23/2022 CLINICAL DATA:  Purulent drainage left hip.  Altered mental status. EXAM: CT PELVIS WITH CONTRAST TECHNIQUE: Multidetector CT imaging of the pelvis was performed using the standard protocol following the bolus administration of intravenous contrast. RADIATION DOSE REDUCTION: This exam was performed according to the departmental dose-optimization program which includes automated exposure control, adjustment of the mA and/or kV according to patient size and/or use of iterative reconstruction technique. CONTRAST:  85m OMNIPAQUE IOHEXOL 350 MG/ML SOLN COMPARISON:  CT scan 11/04/2021 FINDINGS: Urinary Tract:  Unremarkable Bowel:  Unremarkable Vascular/Lymphatic: Atherosclerosis is present, including aortoiliac atherosclerotic disease. Left external iliac node 1.2 cm in short axis on image 73 series 6, formerly 1.3 cm. Other mildly prominent left external iliac nodes are present. Reproductive: Prostatomegaly. Suspected small right scrotal hydrocele. Other:  No supplemental non-categorized findings. Musculoskeletal: Bony lucency along the left acetabulum measuring about 6.1 by 1.9 cm on image 77 series 6, similar appearance on 11/04/2021, appearance favors particulate disease. Infection is a less likely differential diagnostic consideration for this bony lucency given the lack of substantial progression over the last 9 months. Left total hip prosthesis observed, no substantial abnormal lucency around the stem the femoral component. There is heterotopic ossification posterior to the proximal femur. On images 134 through 147 there is posterior periostitis and cortical thinning along the proximal femoral diaphysis in the vicinity of the linea aspera. Osteomyelitis is not excluded. There is some endosteal demineralization in this vicinity as well as shown on  image 159 series 9. Cutaneous wound overlying the greater trochanter on image 94 series 6. Band of associated soft tissue density extends towards the gluteal musculature, trochanteric bursa, and greater trochanter. No new destructive findings or demineralization of the greater trochanter identified. Streak artifact from the hip implant itself reduces signal to noise ratio in the area of interest. Connectivity of the wound to the joint is difficult to totally exclude. IMPRESSION: 1. Cutaneous wound overlying the left greater trochanter, extending towards the trochanteric bursa and greater trochanter region. Connectivity with the left hip joint is not entirely excluded. 2. Periostitis and cortical thinning along the posterior proximal femoral diaphysis in the vicinity of the linea aspera. Appearance raises suspicion for proximal femoral osteomyelitis. 3. Bony lucency along the left acetabulum measuring 6.1 by 1.9 cm, similar appearance on 11/04/2021, appearance favors particulate disease. Infection is a less likely  differential diagnostic consideration for this bony lucency given the lack of substantial progression over the last 9 months. 4. Left total hip prosthesis. 5. Prostatomegaly. 6. Suspected small right scrotal hydrocele. 7. Mildly prominent left external iliac nodes, nonspecific but probably reactive. 8. Aortic atherosclerosis. Aortic Atherosclerosis (ICD10-I70.0). Electronically Signed   By: Van Clines M.D.   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 Elevated sed rate of 107. No recent hx of treatment for pji.  - would ideally like to have deep tissue wound sampling if no OR in his future. Recommend to see if IR can do joint aspiration, but this would likely need conscious sedation.  - alternatively, can treat empirically with daptomycin and ceftriaxone - would do 4 wk then convert to chronic oral abtx suppression since unable to get source  control - his piv is infiltrated thus needs another piv site, can do picc line - will d/c vancomycin - will check for MRSA colonization.  Covid pneumonia = continue with molnupiravir, still requires some supplemental oxygen. Isolation continues for 10 days since symptom onset or positive test  Caren Griffins B. [facility] for Infectious Diseases [phone]

## 2022-07-25 NOTE — Evaluation (Signed)
Clinical/Bedside Swallow Evaluation Patient Details  Name: Mark Howe MRN: [id number] Date of Birth: Oct 31, 1934  Today's Date: 07/25/2022 Time: SLP Start Time (ACUTE ONLY): 79 SLP Stop Time (ACUTE ONLY): 1345 SLP Time Calculation (min) (ACUTE ONLY): 25 min  Past Medical History:  Past Medical History:  Diagnosis Date   Arthritis    L hip & R knee- OA   Cancer ([place])    basal cell CA- face   Shortness of breath    Past Surgical History:  Past Surgical History:  Procedure Laterality Date   APPENDECTOMY     [facility]- [facility]   EYE SURGERY     cataracts removed, laser (bilateral)   NASAL SINUS SURGERY     [facility]- 1980's    TOTAL HIP ARTHROPLASTY  11/24/2011   Procedure: TOTAL HIP ARTHROPLASTY;  Surgeon: Ninetta Lights, MD;  Location: [facility];  Service: Orthopedics;  Laterality: Left;   variose     varicose vein surgery   HPI:  Pt is a 87 y.o. male [facility] rehab resident who presents for being reported to be acutely altered and agitated not wanting to cooperate with staff. He had been diagnosed with COVID-19 about 3 days ago and was noted to have a mild cough. Pt had been found to have a  draining wound of the left hip, but did not appear to be on antibiotics for treatment. CT head was without acute abnormality.  Per MD note (11/10) "Suspect encephalopathy choreal nature". BSE (11/14/21) revealed possible pharyngeal dysphagia with dys 3/thin liquids recommended. F/u SLP tx noted no s/sx of aspiration with solids or liquids. He was placed on dys 2 diet due to issues with mastication. PMH: HTN, IDA, HLD, diastolic CHF,  BPH, and dementia.    Assessment / Plan / Recommendation  Clinical Impression  Pt continues to exhibit s/sx of suspected componenet of pharyngeal dysphagia and possible esophageal dysphagia as well. Pt was initially very agitated requesting restraints be removed. SLP removed restraints during lunch meal (handed off to RN at end of session). Pts swallow ability appears consistent  with his baseline swallowing per last interaction with this SLP in 11/2021. Symptoms noted this date included baseline wet vocal quality. Wetness in voice was appreciated before and during PO consumption, delayed cough x2 towards end of lunch meal. Pt also states he has episodic globus sensation to sternal region and occasionally food will come back up. No hx of PNA noted. Pt with edentulous oral cavity, prefers finely chopped meats due to inabliity to masticate solids without dentition. Recommend dysphagia 2 (finely chopped) and thin liquids with meds in puree (crush larger). SLP to follow for diet tolerance/ and or need for future instrumental swallow assessment. SLP Visit Diagnosis: Dysphagia, unspecified (R13.10);Dysphagia, oral phase (R13.11)    Aspiration Risk  Mild aspiration risk;Moderate aspiration risk    Diet Recommendation   Dysphagia 2 (finely chopped) thin liquids  Medication Administration: Crushed with puree    Other  Recommendations Oral Care Recommendations: Oral care BID    Recommendations for follow up therapy are one component of a multi-disciplinary discharge planning process, led by the attending physician.  Recommendations may be updated based on patient status, additional functional criteria and insurance authorization.  Follow up Recommendations Other (comment) (TBD)      Assistance Recommended at Discharge Frequent or constant Supervision/Assistance  Functional Status Assessment Patient has had a recent decline in their functional status and demonstrates the ability to make significant improvements in function in a reasonable and predictable  amount of time.  Frequency and Duration min 1 x/week  1 week       Prognosis Prognosis for Safe Diet Advancement: Good Barriers to Reach Goals: Cognitive deficits      Swallow Study   General Date of Onset: 07/23/22 HPI: Pt is a 87 y.o. male [facility] rehab resident who presents for being reported to be acutely altered  and agitated not wanting to cooperate with staff. He had been diagnosed with COVID-19 about 3 days ago and was noted to have a mild cough. Pt had been found to have a  draining wound of the left hip, but did not appear to be on antibiotics for treatment. CT head was without acute abnormality.  Per MD note (11/10) "Suspect encephalopathy choreal nature". BSE (11/14/21) revealed possible pharyngeal dysphagia with dys 3/thin liquids recommended. F/u SLP tx noted no s/sx of aspiration with solids or liquids. He was placed on dys 2 diet due to issues with mastication. PMH: HTN, IDA, HLD, diastolic CHF,  BPH, and dementia. Type of Study: Bedside Swallow Evaluation Previous Swallow Assessment: BSE 11/2021 concern for component of pharyngeal dysphagia Diet Prior to this Study: Dysphagia 2 (chopped);Thin liquids Temperature Spikes Noted: No Respiratory Status: Nasal cannula History of Recent Intubation: No Behavior/Cognition: Alert;Agitated;Requires cueing Oral Cavity Assessment: Within Functional Limits Oral Care Completed by SLP: No Oral Cavity - Dentition: Edentulous Vision: Functional for self-feeding Self-Feeding Abilities: Able to feed self Patient Positioning: Upright in bed Baseline Vocal Quality: Wet Volitional Cough: Congested;Wet Volitional Swallow: Unable to elicit    Oral/Motor/Sensory Function Overall Oral Motor/Sensory Function: Generalized oral weakness   Ice Chips Ice chips: Not tested   Thin Liquid Thin Liquid: Impaired Presentation: Cup Pharyngeal  Phase Impairments: Suspected delayed Swallow;Multiple swallows;Wet Vocal Quality    Nectar Thick Nectar Thick Liquid: Not tested   Honey Thick Honey Thick Liquid: Not tested   Puree Puree: Impaired Presentation: Self Fed Pharyngeal Phase Impairments: Suspected delayed Swallow;Multiple swallows;Wet Vocal Quality;Cough - Delayed   Solid     Solid: Impaired Presentation: Self Fed Oral Phase Functional Implications: Prolonged oral  transit Pharyngeal Phase Impairments: Suspected delayed Swallow;Multiple swallows;Cough - Delayed;Wet Vocal Quality      Edilia Ghuman H. MA, CCC-SLP Acute Rehabilitation Services   07/25/2022,2:00 PM

## 2022-07-25 NOTE — Progress Notes (Signed)
Peripherally Inserted Central Catheter Placement  The IV Nurse has discussed with the patient and/or persons authorized to consent for the patient, the purpose of this procedure and the potential benefits and risks involved with this procedure.  The benefits include less needle sticks, lab draws from the catheter, and the patient may be discharged home with the catheter. Risks include, but not limited to, infection, bleeding, blood clot (thrombus formation), and puncture of an artery; nerve damage and irregular heartbeat and possibility to perform a PICC exchange if needed/ordered by physician.  Alternatives to this procedure were also discussed.  Bard Power PICC patient education guide, fact sheet on infection prevention and patient information card has been provided to patient /or left at bedside. Telephone consent obtained from sister.   PICC Placement Documentation  PICC Single Lumen 07/25/22 Right Brachial 39 cm 0 cm (Active)  Indication for Insertion or Continuance of Line Prolonged intravenous therapies 07/25/22 1730  Exposed Catheter (cm) 0 cm 07/25/22 1730  Site Assessment Clean, Dry, Intact 07/25/22 1730  Line Status Saline locked;Flushed;Blood return noted 07/25/22 1730  Dressing Type Transparent;Securing device 07/25/22 1730  Dressing Status Antimicrobial disc in place;Clean, Dry, Intact 07/25/22 1730  Safety Lock Not Applicable 42/68/34 1962  Line Care Connections checked and tightened 07/25/22 1730  Line Adjustment (NICU/IV Team Only) No 07/25/22 1730  Dressing Intervention New dressing 07/25/22 1730  Dressing Change Due 08/01/22 07/25/22 1730       Mark Howe 07/25/2022, 5:42 PM

## 2022-07-25 NOTE — TOC Initial Note (Signed)
Transition of Care [facility]) - Initial/Assessment Note    Patient Details  Name: Mark Howe MRN: [id number] Date of Birth: 02-Oct-1934  [facility] CM/SW Contact:    Tresa Endo Phone Number: 07/25/2022, 12:00 PM  Clinical Narrative:                 CSW received SNF consult. CSW spoke with pt sister [name] via phone, pt is oriented to self and has (AMS). CSW introduced self and explained role at the hospital. Pt sister reports that PTA the (was living at [facility] for LTC and will return at DC. PT reports pt requires totalA+2 for all care at bed level.   CSW will continue to follow.    Expected Discharge Plan: Skilled Nursing Facility Barriers to Discharge: Continued Medical Work up   Patient Goals and CMS Choice Patient states their goals for this hospitalization and ongoing recovery are:: Rehab CMS Medicare.gov Compare Post Acute Care list provided to:: Patient Choice offered to / list presented to : Sibling, Patient  Expected Discharge Plan and Services Expected Discharge Plan: [place] In-house Referral: Clinical Social Work   Post Acute Care Choice: [place] Living arrangements for the past 2 months: [place]                                      Prior Living Arrangements/Services Living arrangements for the past 2 months: [place] Lives with:: Facility Resident Patient language and need for interpreter reviewed:: Yes Do you feel safe going back to the place where you live?: Yes      Need for Family Participation in Patient Care: Yes (Comment) Care giver support system in place?: Yes (comment)   Criminal Activity/Legal Involvement Pertinent to Current Situation/Hospitalization: No - Comment as needed  Activities of Daily Living      Permission Sought/Granted Permission sought to share information with : Family Supports, Chartered certified accountant  granted to share information with : Yes, Verbal Permission Granted  Share Information with NAME: [name] (Sister) [phone]  Permission granted to share info w AGENCY: SNF  Permission granted to share info w Relationship: [name] (Sister) [phone]  Permission granted to share info w Contact Information: [name] (Sister) [phone]  Emotional Assessment Appearance:: Appears stated age Attitude/Demeanor/Rapport: Unable to Assess Affect (typically observed): Unable to Assess Orientation: : Oriented to Self Alcohol / Substance Use: Not Applicable Psych Involvement: No (comment)  Admission diagnosis:  Osteomyelitis ([place]) [M86.9] AKI (acute kidney injury) ([place]) [J09.3] Complicated open wound of left hip, initial encounter [S71.002A] Altered mental status, unspecified altered mental status type [R41.82] COVID-19 [U07.1] Patient Active Problem List   Diagnosis Date Noted   Left hip postoperative wound infection 07/23/2022   COVID-19 virus infection 07/23/2022   Chronic diastolic CHF (congestive heart failure) ([place]) 07/23/2022   Cellulitis of foot 07/23/2022   Macrocytic anemia 07/23/2022   Dementia without behavioral disturbance ([place]) 07/23/2022   Pruritus 01/21/2022   Unspecified protein-calorie malnutrition ([place]) 11/19/2021   Postural hypotension 11/19/2021   Neurocognitive deficits 11/19/2021   Wound healing, delayed 26/71/2458   Acute metabolic encephalopathy 09/98/3382   Hypertension 11/11/2021   Benign prostatic hyperplasia 11/11/2021   Iron deficiency anemia 11/11/2021   Pure hypercholesterolemia 11/11/2021   fall with rhabdomyolysis  11/11/2021   Hematuria 11/11/2021   Acute kidney injury superimposed on chronic kidney disease ([place]) 11/11/2021  Diastolic dysfunction 36/64/4034   Ambulatory dysfunction 11/11/2021   Conjunctivitis 11/11/2021   Elevated AST (SGOT) 11/11/2021   Acute encephalopathy 11/11/2021   PCP:  [name],  MD Pharmacy:   [facility] [address], [place] - [address] [place] [place] [address] Phone: [phone] Fax: [phone]     Social Determinants of Health (SDOH) Interventions    Readmission Risk Interventions     No data to display

## 2022-07-25 NOTE — [place] FL2 (Signed)
[place] MEDICAID FL2 LEVEL OF CARE SCREENING TOOL     IDENTIFICATION  Patient Name: Mark Howe Birthdate: October 06, 1934 Sex: male Admission Date (Current Location): 07/23/2022  [facility] and [place] Number:  Herbalist and Address:  [facility] [place], [place] [address], [place], [place] [address]      Provider Number: [id number]  Attending Physician Name and Address:  Nita Sells, MD  Relative Name and Phone Number:  [name] (Sister) [phone]    Current Level of Care: Hospital Recommended Level of Care: [place] Prior Approval Number:    Date Approved/Denied:   PASRR Number: [id number]  Discharge Plan: SNF    Current Diagnoses: Patient Active Problem List   Diagnosis Date Noted   Left hip postoperative wound infection 07/23/2022   COVID-19 virus infection 07/23/2022   Chronic diastolic CHF (congestive heart failure) ([place]) 07/23/2022   Cellulitis of foot 07/23/2022   Macrocytic anemia 07/23/2022   Dementia without behavioral disturbance ([place]) 07/23/2022   Pruritus 01/21/2022   Unspecified protein-calorie malnutrition ([place]) 11/19/2021   Postural hypotension 11/19/2021   Neurocognitive deficits 11/19/2021   Wound healing, delayed 44/09/270   Acute metabolic encephalopathy 53/66/4403   Hypertension 11/11/2021   Benign prostatic hyperplasia 11/11/2021   Iron deficiency anemia 11/11/2021   Pure hypercholesterolemia 11/11/2021   fall with rhabdomyolysis  11/11/2021   Hematuria 11/11/2021   Acute kidney injury superimposed on chronic kidney disease ([place]) 47/42/5956   Diastolic dysfunction 38/75/6433   Ambulatory dysfunction 11/11/2021   Conjunctivitis 11/11/2021   Elevated AST (SGOT) 11/11/2021   Acute encephalopathy 11/11/2021    Orientation RESPIRATION BLADDER Height & Weight     Self  O2 (2L O2 [place]) Incontinent, External catheter Weight:   Height:     BEHAVIORAL SYMPTOMS/MOOD NEUROLOGICAL BOWEL  NUTRITION STATUS        Diet (See DC Summary)  AMBULATORY STATUS COMMUNICATION OF NEEDS Skin   Total Care Verbally Surgical wounds                       Personal Care Assistance Level of Assistance  Bathing, Feeding, Dressing, Total care Bathing Assistance: Maximum assistance Feeding assistance: Limited assistance Dressing Assistance: Maximum assistance Total Care Assistance: Maximum assistance   Functional Limitations Info  Sight, Hearing, Speech Sight Info: Adequate Hearing Info: Adequate Speech Info: Adequate    SPECIAL CARE FACTORS FREQUENCY  PT (By licensed PT), OT (By licensed OT)     PT Frequency: 5x a week OT Frequency: 5x a week            Contractures Contractures Info: Not present    Additional Factors Info  Code Status, Allergies Code Status Info: Full Allergies Info: NKA           Current Medications (07/25/2022):  This is the current hospital active medication list Current Facility-Administered Medications  Medication Dose Route Frequency Provider Last Rate Last Admin   0.9 %  sodium chloride infusion   Intravenous Continuous Nita Sells, MD 75 mL/hr at 07/25/22 0538 New Bag at 07/25/22 0538   acetaminophen (TYLENOL) tablet 650 mg  650 mg Oral Q6H PRN Fuller Plan A, MD       Or   acetaminophen (TYLENOL) suppository 650 mg  650 mg Rectal Q6H PRN Fuller Plan A, MD   650 mg at 07/24/22 1821   albuterol (VENTOLIN HFA) 108 (90 Base) MCG/ACT inhaler 2 puff  2 puff Inhalation BID Nita Sells, MD   2 puff  at 07/25/22 0837   cefTRIAXone (ROCEPHIN) 2 g in sodium chloride 0.9 % 100 mL IVPB  2 g Intravenous Q24H Norval Morton, MD   Stopped at 07/24/22 1846   dexamethasone (DECADRON) injection 4 mg  4 mg Intravenous Q12H Nita Sells, MD   4 mg at 07/24/22 2300   enoxaparin (LOVENOX) injection 30 mg  30 mg Subcutaneous Q24H Smith, Rondell A, MD   30 mg at 07/24/22 1818   sodium chloride flush (NS) 0.9 % injection 3 mL  3 mL  Intravenous Q12H Smith, Rondell A, MD   3 mL at 07/24/22 2328   tamsulosin (FLOMAX) capsule 0.4 mg  0.4 mg Oral QHS Smith, Rondell A, MD   0.4 mg at 07/23/22 2300   valproate (DEPACON) 190 mg in dextrose 5 % 50 mL IVPB  190 mg Intravenous Q6H Smith, Rondell A, MD 51.9 mL/hr at 07/25/22 0538 190 mg at 07/25/22 0538   vancomycin (VANCOREADY) IVPB 750 mg/150 mL  750 mg Intravenous Q24H Bertis Ruddy, RPH 150 mL/hr at 07/25/22 0008 750 mg at 07/25/22 0008   white petrolatum (VASELINE) gel 1 Application  1 Application Topical Daily PRN Norval Morton, MD         Discharge Medications: Please see discharge summary for a list of discharge medications.  Relevant Imaging Results:  Relevant Lab Results:   Additional Information SSN: [id number]. Pfizer vaccines 09/26/19, 10/18/19, 06/10/20  Reece Agar, LCSWA

## 2022-07-25 NOTE — Progress Notes (Addendum)
Pharmacy Antibiotic Note  Mark Howe is a 87 y.o. male admitted on 07/23/2022 presenting from [facility] with hip pain, AMS, concern for hip infection and osteomyelitis workup.  Pharmacy has been consulted for vancomycin dosing.  Gram negative coverage per MD  Pt with prosthetic joint infection. ID has changed vanc to daptomycin to avoid worsening CKD. Since he can't stand, we will get a bed weight.   Scr 1.38 Baseline Ck in AM  Plan: Dc vanc Daptomycin '600mg'$  IV x1 then '650mg'$  q24 Ck weekly  Height: '5\' 9"'$  (175.3 cm) IBW/kg (Calculated) : 70.7  Temp (24hrs), Avg:97.8 F (36.6 C), Min:97.4 F (36.3 C), Max:98 F (36.7 C)  Recent Labs  Lab 07/23/22 1202 07/24/22 0132  WBC 7.2 8.2  CREATININE 1.76* 1.38*  LATICACIDVEN 1.4  --      CrCl cannot be calculated (Unknown ideal weight.).    No Known Allergies   Vanc 11/10>>11/12 CTX 11/10>> Daptomycin 11/12>> MRSA PCR pending  Onnie Boer, PharmD, BCIDP, AAHIVP, CPP Infectious Disease Pharmacist 07/25/2022 2:58 PM

## 2022-07-25 NOTE — Progress Notes (Signed)
PROGRESS NOTE   Mark Howe  BJY:[id number] DOB: 1935-03-04 DOA: 07/23/2022 PCP: [name], MD  Brief Narrative:  87 [facility] HTN HLD BPH with hematuria Prior pressure ulcers and admission in 11/2021 for rhabdo after having been found down chronic draining left total hip arthroplasty incision seen in 2016 2018 for this-had surgery about 11 years ago-this never really healed  Allegedly tested positive for COVID around 11/8 at facility also with left hip pain-was acting more irritable than usual erratic and confused BUNs/creatinine 52/1.7 -Lactic acid 1.4, WBC 7.2 hemoglobin 10.7 MCV 100.6 platelets 185, CRP elevated CT showed no intracranial findings periventricular white matter corona radiata hypodensities favoring chronic ischemia CXR = mild bibasilar atelectasis Valproic acid level was 28 Orthopedics was consulted and recommended MRI of the left hip from-speech therapy to see patient for safety with regards to diet Patient was started on Rocephin and vancomycin empirically for coverage   Hospital-Problem based course  Hypoxic respiratory failure COVID-19 - 2/2 COVID versus encephalopathy from meds -continue molnupiravir if able to take p.o., continue for 7 to 10 days Decadron IV 4 mg every 12  Draining left hip, CT shows cutaneous wound over left greater trochanteric towards the bursa?  Osteo -Treat empirically with antibiotics-orthopedic has seen - attempt X2 MRI unsuccessful - Continue tramadol 50 twice daily pain if severe, tylenol as first choice - Continue vancomycin and ceftriaxone for osteomyelitis-Will speak with ID with regards to duration of treatment - saline 75 cc/H  AKI superimposed on CKD 3 - AKI is improved-monitor trends - Continue saline 75 cc/H  Toxic metabolic encephalopathy hilus secondary to infection or underlying dementia superimposed by acute kidney injury -needed to be in restraints since 11/11 - Seems to be a  little bit more aware and coherent now and asking to eat - We will ask speech therapy to see and clear for diet I am comfortable with him having a dysphagia 3 until that time  Dementia with behavioral disturbances - Usually does not have issues with behaviors - Depakote was changed to IV-  DVT prophylaxis:  Code Status:  Family Communication: Guinevere Ferrari sister updated [phone] Disposition:  Status is: Inpatient Remains inpatient appropriate because:   Sick and not stable at this time   Consultants:  Ortho  Procedures: none  Antimicrobials: molnupiravir    Subjective:  More coherent awake alert-asking to eat-does not know where he is time date or person No chest pain no fever Is moving the left hip some  Objective: Vitals:   07/24/22 2020 07/25/22 0000 07/25/22 0500 07/25/22 0838  BP: (!) 155/93 (!) 121/50 (!) 168/91   Pulse: (!) 55 (!) 53 69   Resp:      Temp: 98 F (36.7 C) 97.8 F (36.6 C) (!) 97.4 F (36.3 C)   TempSrc: Axillary Axillary Oral   SpO2: 99% 98% 98% 98%    Intake/Output Summary (Last 24 hours) at 07/25/2022 1024 Last data filed at 07/24/2022 2015 Gross per 24 hour  Intake 1050.67 ml  Output 450 ml  Net 600.67 ml    There were no vitals filed for this visit.  Examination:  Awake coherent x1 white male no distress no icterus no pallor S1-S2 no murmur Chest is clear anterolaterally-poor exam posteriorly Left hip examined he has scarring to the area above the left hip with bandage in place He has trace lower extremity edema ROM is limited secondary to pain   Data Reviewed: personally reviewed   CBC  Component Value Date/Time   WBC 8.2 07/24/2022 0132   RBC 3.36 (L) 07/24/2022 0132   HGB 11.3 (L) 07/24/2022 0132   HCT 34.1 (L) 07/24/2022 0132   PLT 190 07/24/2022 0132   MCV 101.5 (H) 07/24/2022 0132   MCH 33.6 07/24/2022 0132   MCHC 33.1 07/24/2022 0132   RDW 12.9 07/24/2022 0132   LYMPHSABS 1.5 07/23/2022 1202    MONOABS 1.0 07/23/2022 1202   EOSABS 0.1 07/23/2022 1202   BASOSABS 0.1 07/23/2022 1202      Latest Ref Rng & Units 07/24/2022    1:32 AM 07/23/2022   12:02 PM 12/29/2021   12:00 AM  CMP  Glucose 70 - 99 mg/dL 91  89    BUN 8 - 23 mg/dL 41  52  28      Creatinine 0.61 - 1.24 mg/dL 1.38  1.76  1.3      Sodium 135 - 145 mmol/L 137  138  136      Potassium 3.5 - 5.1 mmol/L 4.9  4.9  4.7      Chloride 98 - 111 mmol/L 100  100  103      CO2 22 - 32 mmol/L '21  28  26      '$ Calcium 8.9 - 10.3 mg/dL 8.8  8.9  8.6      Total Protein 6.5 - 8.1 g/dL  7.2    Total Bilirubin 0.3 - 1.2 mg/dL  0.7    Alkaline Phos 38 - 126 U/L  45    AST 15 - 41 U/L  21    ALT 0 - 44 U/L  17       This result is from an external source.      Radiology Studies: CT Head Wo Contrast  Result Date: 07/23/2022 CLINICAL DATA:  Altered mental status, combative EXAM: CT HEAD WITHOUT CONTRAST TECHNIQUE: Contiguous axial images were obtained from the base of the skull through the vertex without intravenous contrast. RADIATION DOSE REDUCTION: This exam was performed according to the departmental dose-optimization program which includes automated exposure control, adjustment of the mA and/or kV according to patient size and/or use of iterative reconstruction technique. COMPARISON:  11/11/2021 FINDINGS: Brain: The brainstem, cerebellum, cerebral peduncles, thalami, basal ganglia, basilar cisterns, and ventricular system appear within normal limits. Periventricular white matter and corona radiata hypodensities favor chronic ischemic microvascular white matter disease. No intracranial hemorrhage, mass lesion, or acute CVA. Vascular: Bilateral common carotid atherosclerotic calcification noted in the neck. Skull: Unremarkable Sinuses/Orbits: Chronic bilateral ethmoid and maxillary sinusitis. Other: No supplemental non-categorized findings. IMPRESSION: 1. No acute intracranial findings. 2. Periventricular white matter and corona  radiata hypodensities favor chronic ischemic microvascular white matter disease. 3. Atherosclerosis. 4. Chronic bilateral ethmoid and maxillary sinusitis. Electronically Signed   By: Van Clines M.D.   On: 07/23/2022 15:14   CT PELVIS W CONTRAST  Result Date: 07/23/2022 CLINICAL DATA:  Purulent drainage left hip.  Altered mental status. EXAM: CT PELVIS WITH CONTRAST TECHNIQUE: Multidetector CT imaging of the pelvis was performed using the standard protocol following the bolus administration of intravenous contrast. RADIATION DOSE REDUCTION: This exam was performed according to the departmental dose-optimization program which includes automated exposure control, adjustment of the mA and/or kV according to patient size and/or use of iterative reconstruction technique. CONTRAST:  44m OMNIPAQUE IOHEXOL 350 MG/ML SOLN COMPARISON:  CT scan 11/04/2021 FINDINGS: Urinary Tract:  Unremarkable Bowel:  Unremarkable Vascular/Lymphatic: Atherosclerosis is present, including aortoiliac atherosclerotic disease. Left external iliac node 1.2  cm in short axis on image 73 series 6, formerly 1.3 cm. Other mildly prominent left external iliac nodes are present. Reproductive: Prostatomegaly. Suspected small right scrotal hydrocele. Other:  No supplemental non-categorized findings. Musculoskeletal: Bony lucency along the left acetabulum measuring about 6.1 by 1.9 cm on image 77 series 6, similar appearance on 11/04/2021, appearance favors particulate disease. Infection is a less likely differential diagnostic consideration for this bony lucency given the lack of substantial progression over the last 9 months. Left total hip prosthesis observed, no substantial abnormal lucency around the stem the femoral component. There is heterotopic ossification posterior to the proximal femur. On images 134 through 147 there is posterior periostitis and cortical thinning along the proximal femoral diaphysis in the vicinity of the linea  aspera. Osteomyelitis is not excluded. There is some endosteal demineralization in this vicinity as well as shown on image 159 series 9. Cutaneous wound overlying the greater trochanter on image 94 series 6. Band of associated soft tissue density extends towards the gluteal musculature, trochanteric bursa, and greater trochanter. No new destructive findings or demineralization of the greater trochanter identified. Streak artifact from the hip implant itself reduces signal to noise ratio in the area of interest. Connectivity of the wound to the joint is difficult to totally exclude. IMPRESSION: 1. Cutaneous wound overlying the left greater trochanter, extending towards the trochanteric bursa and greater trochanter region. Connectivity with the left hip joint is not entirely excluded. 2. Periostitis and cortical thinning along the posterior proximal femoral diaphysis in the vicinity of the linea aspera. Appearance raises suspicion for proximal femoral osteomyelitis. 3. Bony lucency along the left acetabulum measuring 6.1 by 1.9 cm, similar appearance on 11/04/2021, appearance favors particulate disease. Infection is a less likely differential diagnostic consideration for this bony lucency given the lack of substantial progression over the last 9 months. 4. Left total hip prosthesis. 5. Prostatomegaly. 6. Suspected small right scrotal hydrocele. 7. Mildly prominent left external iliac nodes, nonspecific but probably reactive. 8. Aortic atherosclerosis. Aortic Atherosclerosis (ICD10-I70.0). Electronically Signed   By: Van Clines M.D.   On: 07/23/2022 15:08   DG Chest Portable 1 View  Result Date: 07/23/2022 CLINICAL DATA:  Cough and altered mental status.  COVID positive. EXAM: PORTABLE CHEST 1 VIEW COMPARISON:  Chest x-ray dated November 11, 2021. FINDINGS: Unchanged mild cardiomegaly. Mild bibasilar atelectasis. No pleural effusion or pneumothorax. The visualized skeletal structures are unremarkable.  Unchanged elevation of the right hemidiaphragm. IMPRESSION: 1. Mild bibasilar atelectasis. Electronically Signed   By: Titus Dubin M.D.   On: 07/23/2022 12:12     Scheduled Meds:  albuterol  2 puff Inhalation BID   dexamethasone (DECADRON) injection  4 mg Intravenous Q12H   enoxaparin (LOVENOX) injection  30 mg Subcutaneous Q24H   sodium chloride flush  3 mL Intravenous Q12H   tamsulosin  0.4 mg Oral QHS   Continuous Infusions:  sodium chloride 75 mL/hr at 07/25/22 0538   cefTRIAXone (ROCEPHIN)  IV Stopped (07/24/22 1846)   valproate sodium 190 mg (07/25/22 0538)   vancomycin 750 mg (07/25/22 0008)     LOS: 2 days   Time spent: 74  Nita Sells, MD [facility] To contact the attending provider between 7A-7P or the covering provider during after hours 7P-7A, please log into the web site [email or web address] and access using universal [place] password for that web site. If you do not have the password, please call the hospital operator.  07/25/2022, 10:24 AM

## 2022-07-26 ENCOUNTER — Inpatient Hospital Stay (HOSPITAL_COMMUNITY): Payer: Medicare Other

## 2022-07-26 DIAGNOSIS — T8149XA Infection following a procedure, other surgical site, initial encounter: Secondary | ICD-10-CM

## 2022-07-26 LAB — MRSA NEXT GEN BY PCR, NASAL: MRSA by PCR Next Gen: NOT DETECTED

## 2022-07-26 LAB — BASIC METABOLIC PANEL
Anion gap: 7 (ref 5–15)
BUN: 33 mg/dL — ABNORMAL HIGH (ref 8–23)
CO2: 27 mmol/L (ref 22–32)
Calcium: 8.4 mg/dL — ABNORMAL LOW (ref 8.9–10.3)
Chloride: 109 mmol/L (ref 98–111)
Creatinine, Ser: 1.28 mg/dL — ABNORMAL HIGH (ref 0.61–1.24)
GFR, Estimated: 55 mL/min — ABNORMAL LOW (ref >60)
Glucose, Bld: 125 mg/dL — ABNORMAL HIGH (ref 70–99)
Potassium: 4.8 mmol/L (ref 3.5–5.1)
Sodium: 143 mmol/L (ref 135–145)

## 2022-07-26 LAB — CK: Total CK: 294 U/L (ref 49–397)

## 2022-07-26 MED ORDER — DEXAMETHASONE 4 MG PO TABS
4.0000 mg | ORAL_TABLET | Freq: Every day | ORAL | Status: DC
  Administered 2022-07-27 – 2022-07-29 (×3): 4 mg via ORAL
  Filled 2022-07-26 (×4): qty 1

## 2022-07-26 MED ORDER — DIVALPROEX SODIUM 125 MG PO CSDR
125.0000 mg | DELAYED_RELEASE_CAPSULE | Freq: Three times a day (TID) | ORAL | Status: DC
  Administered 2022-07-26 – 2022-07-29 (×9): 125 mg via ORAL
  Filled 2022-07-26 (×12): qty 1

## 2022-07-26 MED ORDER — CITALOPRAM HYDROBROMIDE 20 MG PO TABS
20.0000 mg | ORAL_TABLET | Freq: Every day | ORAL | Status: DC
  Administered 2022-07-26 – 2022-07-29 (×4): 20 mg via ORAL
  Filled 2022-07-26 (×3): qty 1

## 2022-07-26 MED ORDER — MEMANTINE HCL 10 MG PO TABS
10.0000 mg | ORAL_TABLET | Freq: Every day | ORAL | Status: DC
  Administered 2022-07-26 – 2022-07-28 (×3): 10 mg via ORAL
  Filled 2022-07-26 (×3): qty 1

## 2022-07-26 NOTE — Progress Notes (Signed)
Physical Therapy Treatment Patient Details Name: Mark Howe MRN: [id number] DOB: 09/08/1935 Today's Date: 07/26/2022   History of Present Illness 87 y/o male presented to ED on 07/23/22 for AMS from [facility] after being recently dx of COVID. Admitted for acute metabolic encephalopathy and L hip post op wound infection. PMH: HTN, CHF, dementia, CKD    PT Comments    Continuing work on functional mobility and activity tolerance;  Pt with MUCH better ability to participate today, and quite motivated to get OOB; Second person present for safety, given prior participation and behavior issues; Today, pt is motivated to get up and OOB; min assist to pull to sit, min assist to stand from bed and recliner chair; He took pivot steps bed to chair (and then back to bed when RN in formed that he was going to MRI); Overall seemed quite happy to be able to get up; Back in bed and on bedpan end of session; Nsg notified  Recommendations for follow up therapy are one component of a multi-disciplinary discharge planning process, led by the attending physician.  Recommendations may be updated based on patient status, additional functional criteria and insurance authorization.  Follow Up Recommendations  Skilled nursing-short term rehab (<3 hours/day) (return to SNF) Can patient physically be transported by private vehicle: No   Assistance Recommended at Discharge Frequent or constant Supervision/Assistance  Patient can return home with the following     Equipment Recommendations  None recommended by PT    Recommendations for Other Services       Precautions / Restrictions Precautions Precautions: Fall Precaution Comments: Covid Restrictions Weight Bearing Restrictions: No     Mobility  Bed Mobility Overal bed mobility: Needs Assistance Bed Mobility: Supine to Sit     Supine to sit: Min assist     General bed mobility comments: Min assist and cues for technqiue; once back in bed to go  to MRI, pt requested the bedpan; light mod assist to roll for bedpan placement    Transfers Overall transfer level: Needs assistance Equipment used: Rolling walker (2 wheels) Transfers: Sit to/from Stand, Bed to chair/wheelchair/BSC Sit to Stand: Min assist, +2 safety/equipment   Step pivot transfers: Min assist, +2 safety/equipment       General transfer comment: Stood from EOB with min assist to RW; Seemed happy to be standing, and took pivot steps bed to recliner with Bil UE support on RW; cues for fully uprihgt posture in standing; Assisted to stand from recliner and takes steps back to bed because he would bed taken to MRI    Ambulation/Gait               General Gait Details: hopefully next session   Stairs             Wheelchair Mobility    Modified Rankin (Stroke Patients Only)       Balance                                            Cognition Arousal/Alertness: Awake/alert Behavior During Therapy: WFL for tasks assessed/performed Overall Cognitive Status: History of cognitive impairments - at baseline  Exercises      General Comments General comments (skin integrity, edema, etc.): Session conducted on room air and O2 sats ranged 98-100% HR in low 60s      Pertinent Vitals/Pain Pain Assessment Pain Assessment: Faces Faces Pain Scale: Hurts even more Pain Location: Hip with stand to sit back to bed Pain Descriptors / Indicators: Discomfort, Grimacing Pain Intervention(s): Monitored during session, Repositioned    Home Living                          Prior Function            PT Goals (current goals can now be found in the care plan section) Acute Rehab PT Goals Patient Stated Goal: Wants to get up and OOB PT Goal Formulation: Patient unable to participate in goal setting Time For Goal Achievement: 08/07/22 Potential to Achieve Goals:  Good Progress towards PT goals: Progressing toward goals    Frequency    Min 2X/week      PT Plan Current plan remains appropriate    Co-evaluation              AM-PAC PT "6 Clicks" Mobility   Outcome Measure  Help needed turning from your back to your side while in a flat bed without using bedrails?: A Lot Help needed moving from lying on your back to sitting on the side of a flat bed without using bedrails?: A Little Help needed moving to and from a bed to a chair (including a wheelchair)?: A Lot Help needed standing up from a chair using your arms (e.g., wheelchair or bedside chair)?: A Little Help needed to walk in hospital room?: A Lot Help needed climbing 3-5 steps with a railing? : Total 6 Click Score: 13    End of Session Equipment Utilized During Treatment: Gait belt Activity Tolerance: Patient tolerated treatment well Patient left: in bed;with call bell/phone within reach;with bed alarm set Nurse Communication: Mobility status PT Visit Diagnosis: Muscle weakness (generalized) (M62.81);Unsteadiness on feet (R26.81);Difficulty in walking, not elsewhere classified (R26.2)     Time: 1448-1856 PT Time Calculation (min) (ACUTE ONLY): 31 min  Charges:  $Therapeutic Activity: 23-37 mins                     Roney Marion, PT  Acute Rehabilitation Services Office [place] 07/26/2022, 1:10 PM

## 2022-07-26 NOTE — Progress Notes (Signed)
[facility] for Infectious Disease  Date of Admission:  07/23/2022     Total days of antibiotics 4         ASSESSMENT:  Mr. Lynk has left hip postoperative wound infection with active drainage with plans for IR evaluation for possible aspiration and possible deep tissue sampling as he is a poor candidate for two stage revision. Orthopedics recommending antibiotic treatment and could consider I&D with explant of components if antibiotics fail. Currently on broad spectrum coverage with daptomycin and ceftriaxone. PICC line in place. Initial plan for 4 weeks of daptomycin and ceftriaxone. Therapeutic monitoring of CK levels while on daptomycin. Will need suppression given retaining of hardware.  Covid being treated with molnupiravir and corticosteroids. Remaining medical and supportive care per primary team.   PLAN:  Continue daptomycin and ceftriaxone IR evaluation for aspiration and possible deep tissue sampling to guide antibiotic therapy. Wound care per Orthopedics  Therapeutic drug monitoring of CK levels.  Continue molnupiravir for Covid.  Remaining medical and supportive care per primary team.   Principal Problem:   Left hip postoperative wound infection Active Problems:   Benign prostatic hyperplasia   Acute kidney injury superimposed on chronic kidney disease (HCC)   Acute metabolic encephalopathy   COVID-19 virus infection   Chronic diastolic CHF (congestive heart failure) (HCC)   Cellulitis of foot   Macrocytic anemia   Dementia without behavioral disturbance (HCC)    albuterol  2 puff Inhalation BID   Chlorhexidine Gluconate Cloth  6 each Topical Daily   citalopram  20 mg Oral Daily   dexamethasone  4 mg Oral Daily   divalproex  125 mg Oral TID   enoxaparin (LOVENOX) injection  40 mg Subcutaneous Q24H   memantine  10 mg Oral QHS   sodium chloride flush  10-40 mL Intracatheter Q12H   sodium chloride flush  3 mL Intravenous Q12H   tamsulosin  0.4 mg Oral QHS     SUBJECTIVE:  Afebrile overnight with no acute events. Unable to get MRI completed secondary to movement.   No Known Allergies   Review of Systems: Review of Systems  Constitutional:  Negative for chills, fever and weight loss.  Respiratory:  Negative for cough, shortness of breath and wheezing.   Cardiovascular:  Negative for chest pain and leg swelling.  Gastrointestinal:  Negative for abdominal pain, constipation, diarrhea, nausea and vomiting.  Skin:  Negative for rash.      OBJECTIVE: Vitals:   07/25/22 1552 07/25/22 1820 07/25/22 2023 07/25/22 2158  BP: (!) 153/69 (!) 164/67  (!) 156/74  Pulse:  72  95  Resp:  17    Temp: 98.2 F (36.8 C) 98.3 F (36.8 C)    TempSrc: Oral Oral    SpO2: 100% 100% 97%   Weight:      Height:       Body mass index is 27.02 kg/m.  Physical Exam Constitutional:      General: He is not in acute distress.    Appearance: He is well-developed.  Cardiovascular:     Rate and Rhythm: Normal rate and regular rhythm.     Heart sounds: Normal heart sounds.  Pulmonary:     Effort: Pulmonary effort is normal.  Skin:    General: Skin is warm and dry.  Neurological:     Mental Status: He is alert.     Lab Results Lab Results  Component Value Date   WBC 8.2 07/24/2022   HGB 11.3 (L) 07/24/2022  HCT 34.1 (L) 07/24/2022   MCV 101.5 (H) 07/24/2022   PLT 190 07/24/2022    Lab Results  Component Value Date   CREATININE 1.28 (H) 07/26/2022   BUN 33 (H) 07/26/2022   NA 143 07/26/2022   K 4.8 07/26/2022   CL 109 07/26/2022   CO2 27 07/26/2022    Lab Results  Component Value Date   ALT 17 07/23/2022   AST 21 07/23/2022   ALKPHOS 45 07/23/2022   BILITOT 0.7 07/23/2022     Microbiology: Recent Results (from the past 240 hour(s))  Resp Panel by RT-PCR (Flu A&B, Covid) Anterior Nasal Swab     Status: Abnormal   Collection Time: 07/23/22 11:35 AM   Specimen: Anterior Nasal Swab  Result Value Ref Range Status   SARS  Coronavirus 2 by RT PCR POSITIVE (A) NEGATIVE Final    Comment: (NOTE) SARS-CoV-2 target nucleic acids are DETECTED.  The SARS-CoV-2 RNA is generally detectable in upper respiratory specimens during the acute phase of infection. Positive results are indicative of the presence of the identified virus, but do not rule out bacterial infection or co-infection with other pathogens not detected by the test. Clinical correlation with patient history and other diagnostic information is necessary to determine patient infection status. The expected result is Negative.  Fact Sheet for Patients: EntrepreneurPulse.com.au  Fact Sheet for Healthcare Providers: IncredibleEmployment.be  This test is not yet approved or cleared by the [place] FDA and  has been authorized for detection and/or diagnosis of SARS-CoV-2 by FDA under an Emergency Use Authorization (EUA).  This EUA will remain in effect (meaning this test can be used) for the duration of  the COVID-19 declaration under Section 564(b)(1) of the A ct, 21 U.S.C. section 360bbb-3(b)(1), unless the authorization is terminated or revoked sooner.     Influenza A by PCR NEGATIVE NEGATIVE Final   Influenza B by PCR NEGATIVE NEGATIVE Final    Comment: (NOTE) The Xpert Xpress SARS-CoV-2/FLU/RSV plus assay is intended as an aid in the diagnosis of influenza from Nasopharyngeal swab specimens and should not be used as a sole basis for treatment. Nasal washings and aspirates are unacceptable for Xpert Xpress SARS-CoV-2/FLU/RSV testing.  Fact Sheet for Patients: EntrepreneurPulse.com.au  Fact Sheet for Healthcare Providers: IncredibleEmployment.be  This test is not yet approved or cleared by the [place] FDA and has been authorized for detection and/or diagnosis of SARS-CoV-2 by FDA under an Emergency Use Authorization (EUA). This EUA will remain in effect (meaning  this test can be used) for the duration of the COVID-19 declaration under Section 564(b)(1) of the Act, 21 U.S.C. section 360bbb-3(b)(1), unless the authorization is terminated or revoked.  Performed at [facility], [place] [address], [place], [place] [address]   MRSA Next Gen by PCR, Nasal     Status: None   Collection Time: 07/26/22  4:02 AM   Specimen: Nasal Mucosa; Nasal Swab  Result Value Ref Range Status   MRSA by PCR Next Gen NOT DETECTED NOT DETECTED Final    Comment: (NOTE) The GeneXpert MRSA Assay (FDA approved for NASAL specimens only), is one component of a comprehensive MRSA colonization surveillance program. It is not intended to diagnose MRSA infection nor to guide or monitor treatment for MRSA infections. Test performance is not FDA approved in patients less than 68 years old. Performed at [facility], [place] [address], [place], [place] [address]      Terri Piedra, [facility] for Infectious Disease  [facility]  07/26/2022  1:37 PM

## 2022-07-26 NOTE — Progress Notes (Signed)
PROGRESS NOTE   Mark Howe  WUJ:[id number] DOB: Nov 06, 1934 DOA: 07/23/2022 PCP: [name], MD  Brief Narrative:  87 year old [facility] resident HTN HLD BPH with hematuria Prior pressure ulcers and admission in 11/2021 for rhabdo after having been found down chronic draining left total hip arthroplasty incision seen in 2016 2018 for this-had surgery about 11 years ago-this never really healed  Allegedly tested positive for COVID around 11/8 at facility also with left hip pain-was acting more irritable than usual erratic and confused BUNs/creatinine 52/1.7 -Lactic acid 1.4, WBC 7.2 hemoglobin 10.7 MCV 100.6 platelets 185, CRP elevated CT showed no intracranial findings periventricular white matter corona radiata hypodensities favoring chronic ischemia CXR = mild bibasilar atelectasis Valproic acid level was 28 Orthopedics was consulted and recommended MRI of the left hip from-speech therapy to see patient for safety with regards to diet Patient was started on Rocephin and vancomycin empirically for coverage ID was consulted as well for Recs and adjusted ABx   Hospital-Problem based course  Hypoxic respiratory failure COVID-19 - 2/2 COVID versus encephalopathy from meds -continue molnupiravir if able however as has not had an oxygen requirement probably can hold, switch Decadron to 4 mg daily, end date 11/20  Draining left hip, CT shows cutaneous wound over left greater trochanteric towards the bursa?  Osteo -Treat empirically with antibiotics-orthopedic has seen--awaiting to hear final word asked if operative management would be done versus biopsy versus just watchful waiting with medication and antibiotics - attempt X2 MRI unsuccessful - Continue tramadol 50 twice daily pain if severe, tylenol as first choice - ID input appreciated now on daptomycin/ceftriaxone treating for 4 weeks ending 12/8 via PICC line which was placed 11/12 -Pharmacist consult requested for  OPAT  AKI superimposed on CKD 3 - AKI is improved-monitor trends - Continues to improve  Toxic metabolic encephalopathy hilus secondary to infection or underlying dementia superimposed by acute kidney injury -off restraints since 11/13 and tolerating - Seems to be a little bit more aware and coherent now and asking to eat - Speech therapy saw him and graduated him to dysphagia 2  Dementia with behavioral disturbances - Usually does not have issues with behaviors - As was altered on admission was on IV Depakote-resume Depakote sprinkle 250 3 times daily, Celexa 20 daily, Namenda 10 at bedtime  DVT prophylaxis:  Code Status:  Family Communication: Guinevere Ferrari sister updated [phone] on 11/13 Disposition:  Status is: Inpatient Remains inpatient appropriate because:   Sick and not stable at this time   Consultants:  Ortho  Procedures: none  Antimicrobials: molnupiravir    Subjective:  Much more coherent awake alert no distress, seems comfortable, no chest pain no fever Wants to eat States there is no pain in his left hip  Objective: Vitals:   07/25/22 1552 07/25/22 1820 07/25/22 2023 07/25/22 2158  BP: (!) 153/69 (!) 164/67  (!) 156/74  Pulse:  72  95  Resp:  17    Temp: 98.2 F (36.8 C) 98.3 F (36.8 C)    TempSrc: Oral Oral    SpO2: 100% 100% 97%   Weight:      Height:        Intake/Output Summary (Last 24 hours) at 07/26/2022 0953 Last data filed at 07/25/2022 2037 Gross per 24 hour  Intake 1471.29 ml  Output 500 ml  Net 971.29 ml    Filed Weights   07/25/22 1500  Weight: 83 kg    Examination:  EOMI NCAT no focal deficit  no icterus no pallor Chest clear no added sound rales rhonchi wheeze S1-S2 slightly tachycardic ROM intact no focal deficit moving left hip some-see decreased amount of drainage Abdomen soft no rebound no guarding Did not examine for decubiti Chest is clear  Data Reviewed: personally reviewed   CBC    Component Value  Date/Time   WBC 8.2 07/24/2022 0132   RBC 3.36 (L) 07/24/2022 0132   HGB 11.3 (L) 07/24/2022 0132   HCT 34.1 (L) 07/24/2022 0132   PLT 190 07/24/2022 0132   MCV 101.5 (H) 07/24/2022 0132   MCH 33.6 07/24/2022 0132   MCHC 33.1 07/24/2022 0132   RDW 12.9 07/24/2022 0132   LYMPHSABS 1.5 07/23/2022 1202   MONOABS 1.0 07/23/2022 1202   EOSABS 0.1 07/23/2022 1202   BASOSABS 0.1 07/23/2022 1202      Latest Ref Rng & Units 07/26/2022    4:01 AM 07/24/2022    1:32 AM 07/23/2022   12:02 PM  CMP  Glucose 70 - 99 mg/dL 125  91  89   BUN 8 - 23 mg/dL 33  41  52   Creatinine 0.61 - 1.24 mg/dL 1.28  1.38  1.76   Sodium 135 - 145 mmol/L 143  137  138   Potassium 3.5 - 5.1 mmol/L 4.8  4.9  4.9   Chloride 98 - 111 mmol/L 109  100  100   CO2 22 - 32 mmol/L '27  21  28   '$ Calcium 8.9 - 10.3 mg/dL 8.4  8.8  8.9   Total Protein 6.5 - 8.1 g/dL   7.2   Total Bilirubin 0.3 - 1.2 mg/dL   0.7   Alkaline Phos 38 - 126 U/L   45   AST 15 - 41 U/L   21   ALT 0 - 44 U/L   17      Radiology Studies: [place] EKG SITE RITE  Result Date: 07/25/2022 If Site Rite image not attached, placement could not be confirmed due to current cardiac rhythm.    Scheduled Meds:  albuterol  2 puff Inhalation BID   Chlorhexidine Gluconate Cloth  6 each Topical Daily   dexamethasone (DECADRON) injection  4 mg Intravenous Q12H   enoxaparin (LOVENOX) injection  40 mg Subcutaneous Q24H   sodium chloride flush  10-40 mL Intracatheter Q12H   sodium chloride flush  3 mL Intravenous Q12H   tamsulosin  0.4 mg Oral QHS   Continuous Infusions:  sodium chloride 75 mL/hr at 07/25/22 2037   cefTRIAXone (ROCEPHIN)  IV Stopped (07/25/22 1837)   DAPTOmycin (CUBICIN) 650 mg in sodium chloride 0.9 % IVPB     valproate sodium 190 mg (07/26/22 0649)     LOS: 3 days   Time spent: 62  Nita Sells, MD [facility] To contact the attending provider between 7A-7P or the covering provider during after hours 7P-7A, please  log into the web site [email or web address] and access using universal [place] [facility] password for that web site. If you do not have the password, please call the hospital operator.  07/26/2022, 9:53 AM

## 2022-07-26 NOTE — TOC Progression Note (Signed)
Transition of Care [facility]) - Initial/Assessment Note    Patient Details  Name: Mark Howe MRN: [id number] Date of Birth: Aug 01, 1935  [facility] CM/SW Contact:    Milinda Antis, LCSWA Phone Number: 07/26/2022, 9:37 AM  Clinical Narrative:                 LCSW contacted [name] with [facility] to inquire about whether insurance authorization would be needed prior to the patient's return and is awaiting a returned call.  Expected Discharge Plan: Skilled Nursing Facility Barriers to Discharge: Continued Medical Work up   Patient Goals and CMS Choice Patient states their goals for this hospitalization and ongoing recovery are:: Rehab CMS Medicare.gov Compare Post Acute Care list provided to:: Patient Choice offered to / list presented to : Sibling, Patient  Expected Discharge Plan and Services Expected Discharge Plan: [place] In-house Referral: Clinical Social Work   Post Acute Care Choice: [place] Living arrangements for the past 2 months: [place]                                      Prior Living Arrangements/Services Living arrangements for the past 2 months: [place] Lives with:: Facility Resident Patient language and need for interpreter reviewed:: Yes Do you feel safe going back to the place where you live?: Yes      Need for Family Participation in Patient Care: Yes (Comment) Care giver support system in place?: Yes (comment)   Criminal Activity/Legal Involvement Pertinent to Current Situation/Hospitalization: No - Comment as needed  Activities of Daily Living      Permission Sought/Granted Permission sought to share information with : Family Supports, Chartered certified accountant granted to share information with : Yes, Verbal Permission Granted  Share Information with NAME: [name] (Sister) [phone]  Permission granted to share info w AGENCY:  SNF  Permission granted to share info w Relationship: [name] (Sister) [phone]  Permission granted to share info w Contact Information: [name] (Sister) [phone]  Emotional Assessment Appearance:: Appears stated age Attitude/Demeanor/Rapport: Unable to Assess Affect (typically observed): Unable to Assess Orientation: : Oriented to Self Alcohol / Substance Use: Not Applicable Psych Involvement: No (comment)  Admission diagnosis:  Osteomyelitis ([place]) [M86.9] AKI (acute kidney injury) ([place]) [E70.3] Complicated open wound of left hip, initial encounter [S71.002A] Altered mental status, unspecified altered mental status type [R41.82] COVID-19 [U07.1] Patient Active Problem List   Diagnosis Date Noted   Left hip postoperative wound infection 07/23/2022   COVID-19 virus infection 07/23/2022   Chronic diastolic CHF (congestive heart failure) ([place]) 07/23/2022   Cellulitis of foot 07/23/2022   Macrocytic anemia 07/23/2022   Dementia without behavioral disturbance ([place]) 07/23/2022   Pruritus 01/21/2022   Unspecified protein-calorie malnutrition ([place]) 11/19/2021   Postural hypotension 11/19/2021   Neurocognitive deficits 11/19/2021   Wound healing, delayed 50/05/3817   Acute metabolic encephalopathy 29/93/7169   Hypertension 11/11/2021   Benign prostatic hyperplasia 11/11/2021   Iron deficiency anemia 11/11/2021   Pure hypercholesterolemia 11/11/2021   fall with rhabdomyolysis  11/11/2021   Hematuria 11/11/2021   Acute kidney injury superimposed on chronic kidney disease ([place]) 67/89/3810   Diastolic dysfunction 17/51/0258   Ambulatory dysfunction 11/11/2021   Conjunctivitis 11/11/2021   Elevated AST (SGOT) 11/11/2021   Acute encephalopathy 11/11/2021   PCP:  [name], MD Pharmacy:   [facility] [address], [place] - [place]  [address] [place] [place] [address] Phone: [phone] Fax: [phone]     Social  Determinants of Health (SDOH) Interventions    Readmission Risk Interventions     No data to display

## 2022-07-26 NOTE — Progress Notes (Signed)
Patient was attempted for their MRI for the 3rd time, I was only able to get one picture and patient was lifting legs the entire time. Unsafe for MRI. He would not lay still and was spoken with multiple times.

## 2022-07-26 NOTE — Plan of Care (Signed)
  Problem: Education: Goal: Knowledge of risk factors and measures for prevention of condition will improve Outcome: Not Progressing   Problem: Coping: Goal: Psychosocial and spiritual needs will be supported Outcome: Not Progressing   Problem: Respiratory: Goal: Will maintain a patent airway Outcome: Not Progressing Goal: Complications related to the disease process, condition or treatment will be avoided or minimized Outcome: Not Progressing   Problem: Safety: Goal: Non-violent Restraint(s) Outcome: Completed/Met

## 2022-07-27 ENCOUNTER — Inpatient Hospital Stay (HOSPITAL_COMMUNITY): Payer: Medicare Other

## 2022-07-27 DIAGNOSIS — T8149XA Infection following a procedure, other surgical site, initial encounter: Secondary | ICD-10-CM | POA: Diagnosis not present

## 2022-07-27 LAB — CBC
HCT: 30.1 % — ABNORMAL LOW (ref 39.0–52.0)
Hemoglobin: 10 g/dL — ABNORMAL LOW (ref 13.0–17.0)
MCH: 33.4 pg (ref 26.0–34.0)
MCHC: 33.2 g/dL (ref 30.0–36.0)
MCV: 100.7 fL — ABNORMAL HIGH (ref 80.0–100.0)
Platelets: 193 10*3/uL (ref 150–400)
RBC: 2.99 MIL/uL — ABNORMAL LOW (ref 4.22–5.81)
RDW: 13.1 % (ref 11.5–15.5)
WBC: 13 10*3/uL — ABNORMAL HIGH (ref 4.0–10.5)
nRBC: 0 % (ref 0.0–0.2)

## 2022-07-27 LAB — RENAL FUNCTION PANEL
Albumin: 1.9 g/dL — ABNORMAL LOW (ref 3.5–5.0)
Anion gap: 7 (ref 5–15)
BUN: 29 mg/dL — ABNORMAL HIGH (ref 8–23)
CO2: 29 mmol/L (ref 22–32)
Calcium: 8.1 mg/dL — ABNORMAL LOW (ref 8.9–10.3)
Chloride: 105 mmol/L (ref 98–111)
Creatinine, Ser: 1.09 mg/dL (ref 0.61–1.24)
GFR, Estimated: >60 mL/min (ref >60)
Glucose, Bld: 94 mg/dL (ref 70–99)
Phosphorus: 1.9 mg/dL — ABNORMAL LOW (ref 2.5–4.6)
Potassium: 4.6 mmol/L (ref 3.5–5.1)
Sodium: 141 mmol/L (ref 135–145)

## 2022-07-27 MED ORDER — LIDOCAINE HCL (PF) 1 % IJ SOLN
5.0000 mL | Freq: Once | INTRAMUSCULAR | Status: AC
  Administered 2022-07-27: 5 mL via INTRADERMAL

## 2022-07-27 MED ORDER — DIVALPROEX SODIUM 125 MG PO CSDR: 250.0000 mg | DELAYED_RELEASE_CAPSULE | Freq: Three times a day (TID) | ORAL | 0 refills | Status: AC

## 2022-07-27 MED ORDER — CITALOPRAM HYDROBROMIDE 20 MG PO TABS: 20.0000 mg | ORAL_TABLET | Freq: Every day | ORAL | 0 refills | Status: AC

## 2022-07-27 MED ORDER — LORAZEPAM 2 MG/ML IJ SOLN
1.0000 mg | INTRAMUSCULAR | Status: DC | PRN
  Administered 2022-07-27 – 2022-07-28 (×3): 2 mg via INTRAVENOUS
  Filled 2022-07-27 (×4): qty 1

## 2022-07-27 MED ORDER — K PHOS MONO-SOD PHOS DI & MONO 155-852-130 MG PO TABS
250.0000 mg | ORAL_TABLET | ORAL | Status: AC
  Filled 2022-07-27 (×2): qty 1

## 2022-07-27 MED ORDER — CEFTRIAXONE IV (FOR PTA / DISCHARGE USE ONLY): 2.0000 g | INTRAVENOUS | 0 refills | Status: AC

## 2022-07-27 MED ORDER — LORAZEPAM 2 MG/ML IJ SOLN
1.0000 mg | Freq: Once | INTRAMUSCULAR | Status: AC
  Administered 2022-07-27: 2 mg via INTRAVENOUS

## 2022-07-27 MED ORDER — DAPTOMYCIN IV (FOR PTA / DISCHARGE USE ONLY): 650.0000 mg | INTRAVENOUS | 0 refills | Status: AC

## 2022-07-27 MED ORDER — DEXAMETHASONE 4 MG PO TABS: 4.0000 mg | ORAL_TABLET | Freq: Every day | ORAL | 0 refills | Status: AC

## 2022-07-27 NOTE — Progress Notes (Signed)
Patient seen and examined today and as biopsy was not yielding and as he has a PICC line we can plan for 4 weeks of antibiotics-he is stable for discharge should complete Decadron for COVID and have IV line pulled at time of completion of IV antibiotics

## 2022-07-27 NOTE — Plan of Care (Signed)
  Problem: Education: Goal: Knowledge of risk factors and measures for prevention of condition will improve Outcome: Progressing   Problem: Coping: Goal: Psychosocial and spiritual needs will be supported Outcome: Progressing   Problem: Respiratory: Goal: Will maintain a patent airway Outcome: Progressing Goal: Complications related to the disease process, condition or treatment will be avoided or minimized Outcome: Progressing   

## 2022-07-27 NOTE — Progress Notes (Addendum)
[facility] for Infectious Disease  Date of Admission:  07/23/2022   Total days of inpatient antibiotics 4  Principal Problem:   Left hip postoperative wound infection Active Problems:   Benign prostatic hyperplasia   Acute kidney injury superimposed on chronic kidney disease (HCC)   Acute metabolic encephalopathy   COVID-19 virus infection   Chronic diastolic CHF (congestive heart failure) (HCC)   Cellulitis of foot   Macrocytic anemia   Dementia without behavioral disturbance ([place])          Assessment: 87 YM admitted with draining wound form prosthetic left hip.   #Left prosthetic hip infection -Ortho engaged and recommend abx and consider I&D if abx fail.  -IR attempted aspiration , but were unsuccessful.  -Pt pulled out PICC. Will place PICC again Recommendations: - Continue daptmycin and ceftriaxone x 4 weeks EOT 12/8. Transition to doxy+ cefadroxil for suppression if HWR is not explanted -ID F/U with Dr. Baxter Flattery  #COVID  - Initially received malnupiravir  - Switched ot Decadron 59m /day   ID will sign off, please engage with any question or concerns.    OPAT ORDERS:  Diagnosis: Left prosthetic joint infection, No Cx available  No Known Allergies   Discharge antibiotics to be given via PICC line:  Per pharmacy protocol daptomycin and ceftriaxone 2gm /day    Duration: 4 weeks End Date: 12/8  [facility] with [facility] Use: Home health RN for IV administration and teaching, line care and labs.    Labs weekly while on IV antibiotics: __ CBC with differential __ CMP __ CRP __ ESR __ CK  __ Please pull PIC at completion of IV antibiotics   Fax weekly labs to ([phone] Clinic Follow Up Appt: 12/11  @ [facility] with SCandiss Norse   Microbiology:   Antibiotics: Dapto 11/12- Ctx 11/10- Vanc 11/10-11/11   SUBJECTIVE: Resting in bed Interval: Afebrile overnight.   Review of Systems: Review of Systems  All other  systems reviewed and are negative.    Scheduled Meds:  albuterol  2 puff Inhalation BID   Chlorhexidine Gluconate Cloth  6 each Topical Daily   citalopram  20 mg Oral Daily   dexamethasone  4 mg Oral Daily   divalproex  125 mg Oral TID   enoxaparin (LOVENOX) injection  40 mg Subcutaneous Q24H   memantine  10 mg Oral QHS   phosphorus  250 mg Oral Q4H   sodium chloride flush  10-40 mL Intracatheter Q12H   sodium chloride flush  3 mL Intravenous Q12H   tamsulosin  0.4 mg Oral QHS   Continuous Infusions:  cefTRIAXone (ROCEPHIN)  IV Stopped (07/26/22 1755)   DAPTOmycin (CUBICIN) 650 mg in sodium chloride 0.9 % IVPB Stopped (07/26/22 2100)   PRN Meds:.acetaminophen **OR** acetaminophen, LORazepam, sodium chloride flush, white petrolatum No Known Allergies  OBJECTIVE: Vitals:   07/26/22 2305 07/27/22 0545 07/27/22 0555 07/27/22 1044  BP: (!) 145/70 (!) 157/110  (!) 144/73  Pulse: (!) 53 61  60  Resp: _0 Temp: 98.3 F (36.8 C)   98.7 F (37.1 C)  TempSrc: Oral   Oral  SpO2: 98% 99%  100%  Weight:   83 kg   Height:       Body mass index is 27.02 kg/m.  Physical Exam Constitutional:      General: He is not in acute distress.    Appearance: He is normal weight. He is  not toxic-appearing.  HENT:     Head: Normocephalic and atraumatic.     Right Ear: External ear normal.     Left Ear: External ear normal.     Nose: No congestion or rhinorrhea.     Mouth/Throat:     Mouth: Mucous membranes are moist.     Pharynx: Oropharynx is clear.  Eyes:     Extraocular Movements: Extraocular movements intact.     Conjunctiva/sclera: Conjunctivae normal.     Pupils: Pupils are equal, round, and reactive to light.  Cardiovascular:     Rate and Rhythm: Normal rate and regular rhythm.     Heart sounds: No murmur heard.    No friction rub. No gallop.  Pulmonary:     Effort: Pulmonary effort is normal.     Breath sounds: Normal breath sounds.  Abdominal:     General: Abdomen  is flat. Bowel sounds are normal.     Palpations: Abdomen is soft.  Musculoskeletal:        General: No swelling. Normal range of motion.     Cervical back: Normal range of motion and neck supple.  Skin:    General: Skin is warm and dry.     Comments: Left hip driang wound  Neurological:     General: No focal deficit present.     Mental Status: He is oriented to person, place, and time.  Psychiatric:        Mood and Affect: Mood normal.       Lab Results Lab Results  Component Value Date   WBC 13.0 (H) 07/27/2022   HGB 10.0 (L) 07/27/2022   HCT 30.1 (L) 07/27/2022   MCV 100.7 (H) 07/27/2022   PLT 193 07/27/2022    Lab Results  Component Value Date   CREATININE 1.09 07/27/2022   BUN 29 (H) 07/27/2022   NA 141 07/27/2022   K 4.6 07/27/2022   CL 105 07/27/2022   CO2 29 07/27/2022    Lab Results  Component Value Date   ALT 17 07/23/2022   AST 21 07/23/2022   ALKPHOS 45 07/23/2022   BILITOT 0.7 07/23/2022        Laurice Record, [place] [facility] for Infectious Disease [facility] 07/27/2022, 4:11 PM

## 2022-07-27 NOTE — Procedures (Signed)
Unsuccessful left hip aspiration attempt. Please see full dictation under the imaging tab in Epic.  Soyla Dryer, [place] [phone] 07/27/2022, 1:44 PM

## 2022-07-27 NOTE — TOC Progression Note (Signed)
Transition of Care [facility]) - Initial/Assessment Note    Patient Details  Name: Mark Howe MRN: [id number] Date of Birth: 12-12-1934  Transition of Care [facility]) CM/SW Contact:    Milinda Antis, LCSWA Phone Number: 07/27/2022, 4:31 PM  Clinical Narrative:                 LCSW contacted [facility] and inquired about what would be needed for the patient to return and is awaiting a response.    Expected Discharge Plan: Skilled Nursing Facility Barriers to Discharge: Continued Medical Work up   Patient Goals and CMS Choice Patient states their goals for this hospitalization and ongoing recovery are:: Rehab CMS Medicare.gov Compare Post Acute Care list provided to:: Patient Choice offered to / list presented to : Sibling, Patient  Expected Discharge Plan and Services Expected Discharge Plan: [place] In-house Referral: Clinical Social Work   Post Acute Care Choice: [place] Living arrangements for the past 2 months: [place] Expected Discharge Date: 07/28/22                                    Prior Living Arrangements/Services Living arrangements for the past 2 months: [place] Lives with:: Facility Resident Patient language and need for interpreter reviewed:: Yes Do you feel safe going back to the place where you live?: Yes      Need for Family Participation in Patient Care: Yes (Comment) Care giver support system in place?: Yes (comment)   Criminal Activity/Legal Involvement Pertinent to Current Situation/Hospitalization: No - Comment as needed  Activities of Daily Living      Permission Sought/Granted Permission sought to share information with : Family Supports, Chartered certified accountant granted to share information with : Yes, Verbal Permission Granted  Share Information with NAME: [name] (Sister) [phone]  Permission granted to share info w AGENCY: SNF  Permission  granted to share info w Relationship: [name] (Sister) [phone]  Permission granted to share info w Contact Information: [name] (Sister) [phone]  Emotional Assessment Appearance:: Appears stated age Attitude/Demeanor/Rapport: Unable to Assess Affect (typically observed): Unable to Assess Orientation: : Oriented to Self Alcohol / Substance Use: Not Applicable Psych Involvement: No (comment)  Admission diagnosis:  Osteomyelitis ([place]) [M86.9] AKI (acute kidney injury) ([place]) [G92.1] Complicated open wound of left hip, initial encounter [S71.002A] Altered mental status, unspecified altered mental status type [R41.82] COVID-19 [U07.1] Patient Active Problem List   Diagnosis Date Noted   Left hip postoperative wound infection 07/23/2022   COVID-19 virus infection 07/23/2022   Chronic diastolic CHF (congestive heart failure) ([place]) 07/23/2022   Cellulitis of foot 07/23/2022   Macrocytic anemia 07/23/2022   Dementia without behavioral disturbance ([place]) 07/23/2022   Pruritus 01/21/2022   Unspecified protein-calorie malnutrition ([place]) 11/19/2021   Postural hypotension 11/19/2021   Neurocognitive deficits 11/19/2021   Wound healing, delayed 19/41/7408   Acute metabolic encephalopathy 14/48/1856   Hypertension 11/11/2021   Benign prostatic hyperplasia 11/11/2021   Iron deficiency anemia 11/11/2021   Pure hypercholesterolemia 11/11/2021   fall with rhabdomyolysis  11/11/2021   Hematuria 11/11/2021   Acute kidney injury superimposed on chronic kidney disease ([place]) 31/49/7026   Diastolic dysfunction 37/85/8850   Ambulatory dysfunction 11/11/2021   Conjunctivitis 11/11/2021   Elevated AST (SGOT) 11/11/2021   Acute encephalopathy 11/11/2021   PCP:  [name], MD Pharmacy:   [facility] [address] [place] - [address]  [place]. [address] [place] [address] Phone: [phone] Fax: [phone]     Social Determinants of  Health (SDOH) Interventions    Readmission Risk Interventions     No data to display

## 2022-07-27 NOTE — Progress Notes (Signed)
SLP Cancellation Note  Patient Details Name: Mark Howe MRN: [id number] DOB: April 14, 1935   Cancelled treatment:       Reason Eval/Treat Not Completed: Medical issues which prohibited therapy;Fatigue/lethargy limiting ability to participate  SLP attempted to see pt to assess tolerance of current diet following BSE 07/25/22. RN reports pt was given Ativan earlier today, and is still somnolent. Will continue efforts when pt is more alert and responsive.  Alynna Hargrove B. Quentin Ore, [facility], [place] Speech Language Pathologist Office: [phone]  Shonna Chock 07/27/2022, 2:40 PM

## 2022-07-27 NOTE — Discharge Summary (Addendum)
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 cannot confirm organism Coronavirus 19 pneumonia AKI superimposed on CKD 3 Dementia with behavioral disturbance Toxic metabolic encephalopathy on admission Decubitus ulcer  Please see below for itemized issues addressed in [facility]- refer to other progress notes for clarity if needed  Discharge Condition: Fair  Diet recommendation: Heart healthy  Filed Weights   07/25/22 1500 07/27/22 0555  Weight: 83 kg 83 kg    History of present illness:  87 year old [facility] resident HTN HLD BPH with hematuria Prior pressure ulcers and admission in 11/2021 for rhabdo after having been found down chronic draining left total hip arthroplasty incision seen in 2016 2018 for this-had surgery about 11 years ago-this never really healed   Allegedly tested positive for COVID around 11/8 at facility also with left hip pain-was acting more irritable than usual erratic and confused BUNs/creatinine 52/1.7 -Lactic acid 1.4, WBC 7.2 hemoglobin 10.7 MCV 100.6 platelets 185, CRP elevated CT showed no intracranial findings periventricular white matter corona radiata hypodensities favoring chronic ischemia CXR = mild bibasilar atelectasis Valproic acid level was 28 Orthopedics was consulted and recommended MRI of the left hip  from-speech therapy to see patient for safety with regards to diet Patient was started on Rocephin and vancomycin empirically for coverage ID was consulted as well for Recs and adjusted ABx  Hospital Course:  A 87 year old XareltoHypoxic respiratory failure COVID-19 - 2/2 COVID versus encephalopathy from meds - Was on Molnupiravir but this was stopped--was switched Decadron to 4 mg daily, end date 11/20   Draining left hip, CT shows cutaneous wound over left greater trochanteric towards the bursa?  Osteo -Treat empirically with antibiotics-orthopedic has seen--attempted to have IR aspirate the joint but this was unsuccessful - attempt X23MRI unsuccessful -Tramadol was discontinued - ID input appreciated now on daptomycin/ceftriaxone treating for 4 weeks ending 12/8 via PICC line which was placed 11/12   AKI superimposed on CKD 3 - AKI is improved-monitor trends - Continues to improve   Toxic metabolic encephalopathy hilus secondary to infection or underlying dementia superimposed by acute kidney injury - intermittently confused and needed Ativan at times.  Seems close to baseline however now for discharge - Speech therapy saw him and graduated him to dysphagia 2   Dementia with behavioral disturbances - Usually does not have issues with behaviors - As was altered on admission was on IV Depakote-resume Depakot orally on discharge  Sacral decubitus - Per wound care dressings as per them   Discharge Exam: Vitals:   07/27/22 0545 07/27/22 1044  BP: (!) 157/110 (!) 144/73  Pulse: 61 60  Resp: 18 17  Temp:  98.7 F (37.1 C)  SpO2: 99% 100%    Subj on day of d/c   Slightly confused no distress no fever no chills On room air currently Cannot obtain full history  General Exam on discharge  EOMI NCAT no icterus no pallor no rales rhonchi no wheeze CTA B no  added sound no wheeze no rales S1-S2 no murmur ROM intact Neurologically intact no focal deficit to power however  mentation is somewhat altered  Discharge Instructions   Discharge Instructions     [facility] pharmacist to adjust dose for Vancomycin, Aminoglycosides and other anti-infective therapies as requested by physician.   Complete by: As directed    [facility] to provide Cath Flo 61m   Complete by: As directed    Administer for PICC line occlusion and as ordered by physician for other access device issues.   Anaphylaxis Kit: Provided to treat any anaphylactic reaction to the medication being provided to the patient if First Dose or when requested by physician   Complete by: As directed    Epinephrine 122mml vial / amp: Administer 0.61m71m0.61ml58mubcutaneously once for moderate to severe anaphylaxis, nurse to call physician and pharmacy when reaction occurs and call 911 if needed for immediate care   Diphenhydramine 50mg36mIV vial: Administer 25-50mg 69mM PRN for first dose reaction, rash, itching, mild reaction, nurse to call physician and pharmacy when reaction occurs   Sodium Chloride 0.9% NS 500ml I38mdminister if needed for hypovolemic blood pressure drop or as ordered by physician after call to physician with anaphylactic reaction   Change dressing on IV access line weekly and PRN   Complete by: As directed    Diet - low sodium heart healthy   Complete by: As directed    Discharge wound care:   Complete by: As directed    essure Injury 11/12/21 Buttocks Right Stage 2 -  Partial thickness loss of dermis presenting as a shallow open injury with a red, pink wound bed without slough. 256 days    Pressure Injury 11/12/21 Vertebral column Upper;Right;Left Stage 2 -  Partial thickness loss of dermis presenting as a shallow open injury with a red, pink wound bed without slough. 256 days   Flush IV access with Sodium Chloride 0.9% and Heparin 10 units/ml or 100 units/ml   Complete by: As directed    Home infusion instructions - [facility]   Complete by: As  directed    Instructions: Flush IV access with Sodium Chloride 0.9% and Heparin 10units/ml or 100units/ml   Change dressing on IV access line: Weekly and PRN   Instructions Cath Flo 2mg: Ad41mister for PICC Line occlusion and as ordered by physician for other access device   [facility] pharmacist to adjust dose for: Vancomycin, Aminoglycosides and other anti-infective therapies as requested by physician   Increase activity slowly   Complete by: As directed    Method of administration may be changed at the discretion of home infusion pharmacist based upon assessment of the patient and/or caregiver's ability to self-administer the medication ordered   Complete by: As directed       Allergies as of 07/27/2022   No Known Allergies      Medication List     STOP taking these medications    bisacodyl 10 MG suppository Commonly known as: DULCOLAX   fexofenadine 180 MG tablet Commonly known as: ALLEGRA   guaiFENesin 100 MG/5ML liquid Commonly known as: ROBITUSSIN   ibuprofen 400 MG tablet Commonly known as: ADVIL   MILK OF MAGNESIA PO   RA SALINE ENEMA RE   sodium polystyrene 15 GM/60ML suspension Commonly known as: KAYEXALATE   traMADol 50 MG tablet Commonly known as: ULTRAM       TAKE these medications    acetaminophen 325 MG tablet  Commonly known as: TYLENOL Take 650 mg by mouth every 6 (six) hours as needed (pain).   cefTRIAXone  IVPB Commonly known as: ROCEPHIN Inject 2 g into the vein daily for 24 days. Indication:  L hip PJI  First Dose: Yes Last Day of Therapy:  08/20/22 Labs - Once weekly:  CBC/D and BMP, Labs - Every other week:  ESR and CRP Method of administration: IV Push Method of administration may be changed at the discretion of home infusion pharmacist based upon assessment of the patient and/or caregiver's ability to self-administer the medication ordered.   citalopram 20 MG tablet Commonly known as: CELEXA Take 1 tablet (20 mg  total) by mouth daily.   daptomycin  IVPB Commonly known as: CUBICIN Inject 650 mg into the vein daily for 24 days. Indication:  L hip PJI  First Dose: Yes Last Day of Therapy:  08/20/22  Labs - Once weekly:  CBC/D, BMP, and CPK Labs - Every other week:  ESR and CRP Method of administration: IV Push Method of administration may be changed at the discretion of home infusion pharmacist based upon assessment of the patient and/or caregiver's ability to self-administer the medication ordered.   dexamethasone 4 MG tablet Commonly known as: DECADRON Take 1 tablet (4 mg total) by mouth daily for 5 days. Start taking on: July 28, 2022   divalproex 125 MG capsule Commonly known as: DEPAKOTE SPRINKLE Take 2 capsules (250 mg total) by mouth 3 (three) times daily.   memantine 10 MG tablet Commonly known as: NAMENDA Take 10 mg by mouth at bedtime.   PRO-STAT PO Take 30 mLs by mouth 2 (two) times daily.   tamsulosin 0.4 MG Caps capsule Commonly known as: FLOMAX Take 0.4 mg by mouth at bedtime.   white petrolatum Oint Commonly known as: VASELINE Apply 1 application. topically as needed for dry skin. What changed: when to take this               Discharge Care Instructions  (From admission, onward)           Start     Ordered   07/27/22 0000  Change dressing on IV access line weekly and PRN  (Home infusion instructions - [facility] )        07/27/22 1545   07/27/22 0000  Discharge wound care:       Comments: essure Injury 11/12/21 Buttocks Right Stage 2 -  Partial thickness loss of dermis presenting as a shallow open injury with a red, pink wound bed without slough. 256 days    Pressure Injury 11/12/21 Vertebral column Upper;Right;Left Stage 2 -  Partial thickness loss of dermis presenting as a shallow open injury with a red, pink wound bed without slough. 256 days   07/27/22 1545           No Known Allergies    The results of significant  diagnostics from this hospitalization (including imaging, microbiology, ancillary and laboratory) are listed below for reference.    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 Patient presented to the ED 07/23/22 with altered mental status, hypoxia and pain with weight-bearing to the left leg. Patient found to be COVID positive but there is also concern for left hip periprosthetic joint infection. Radiology team  asked to perform left hip aspiration. EXAM: Left hip aspiration PROCEDURE: After a thorough discussion of risks and benefits of the procedure, telephone consent was obtained from the patient's sister. The consent discussion included the risk of bleeding, infection and injury to nerves and adjacent blood vessels. It was also discussed at this procedure may not yield any fluid to send to the lab. Preliminary localization was performed over the left hip. The area was marked over the junction of the left prosthetic head-neck junction. After prep and drape in the usual sterile fashion, the skin and deeper subcutaneous tissues were anesthetized with 1% Lidocaine without Epinephrine. Under fluoroscopic guidance, a 20 gauge 3.5 inch spinal needle was advanced into the joint at the lateral margin of the junction of the femoral head and neck. The stylet was removed and a syringe was attached to the needle for attempted aspiration. No fluid returned. The needle was repositioned several times without return of fluid. Then, lavage was attempted with 3 mL normal saline, however this was also unable to be re-aspirated. The needle was removed and the site was covered with a bandage. Due to patient condition, additional attempts were not performed. There was no  immediate complication. FLUOROSCOPY: Radiation Exposure Index (as provided by the fluoroscopic device): 9.6 mGy Kerma IMPRESSION: No fluid return on attempted left hip aspiration. No immediate complication. This exam was performed by Soyla Dryer, NP, and was supervised and interpreted by Maurine Simmering, MD. Electronically Signed   By: Maurine Simmering M.D.   On: 07/27/2022 14:00   MR FEMUR LEFT WO CONTRAST  Result Date: 07/26/2022 CLINICAL DATA:  Remote left total hip arthroplasty. Reported purulent drainage from left hip at time of recent pelvic CT. EXAM: LIMITED MR OF THE LEFT FEMUR WITHOUT CONTRAST TECHNIQUE: Multiplanar, multisequence MR imaging of the left thigh was attempted. Only coronal T1 weighted images were obtained. Patient was unable to complete the examination. No intravenous contrast was administered. COMPARISON:  Pelvic CT 07/23/2022 and 11/04/2021. FINDINGS: Despite efforts by the technologist and patient, mild motion artifact is present on today's exam and could not be eliminated. This reduces exam sensitivity and specificity. As above, study is limited, consisting of coronal T1 weighted images only. Status post left total hip arthroplasty with associated susceptibility artifact. As seen on previous CTs, there is decreased T1 marrow signal medially within the left acetabulum, corresponding with chronic lucency on CT and favoring particle disease. Infection is not entirely excluded by this limited examination. Heterotopic ossification surrounding the proximal left femur appears unchanged. Postsurgical changes are present within the soft tissues posterolateral to the left femoral greater trochanter without significant focal fluid collection on this limited study. IMPRESSION: 1. Limited examination, as above, consisting of T1 coronal images only. 2. Status post left total hip arthroplasty with associated susceptibility artifact. Decreased T1 marrow signal medially within the left acetabulum  corresponds with chronic lucency on previous CT and favoring particle disease. Infection is not entirely excluded by this limited examination. 3. Postsurgical changes posterolateral to the left femoral greater trochanter without significant focal fluid collection on this limited study. 4. If further imaging is indicated clinically, options include repeat CT with intravenous contrast, MRI with appropriate patient sedation and nuclear medicine three-phase bone scan. Electronically Signed   By: Richardean Sale M.D.   On: 07/26/2022 13:35   [place] EKG SITE RITE  Result Date: 07/25/2022 If Site Rite image not attached, placement could not be confirmed due to current cardiac rhythm.  CT Head Wo Contrast  Result Date: 07/23/2022 CLINICAL DATA:  Altered mental status, combative EXAM: CT HEAD WITHOUT CONTRAST TECHNIQUE: Contiguous axial images were obtained from the base of the skull through the vertex without intravenous contrast. RADIATION DOSE REDUCTION: This exam was performed according to the departmental dose-optimization program which includes automated exposure control, adjustment of the mA and/or kV according to patient size and/or use of iterative reconstruction technique. COMPARISON:  11/11/2021 FINDINGS: Brain: The brainstem, cerebellum, cerebral peduncles, thalami, basal ganglia, basilar cisterns, and ventricular system appear within normal limits. Periventricular white matter and corona radiata hypodensities favor chronic ischemic microvascular white matter disease. No intracranial hemorrhage, mass lesion, or acute CVA. Vascular: Bilateral common carotid atherosclerotic calcification noted in the neck. Skull: Unremarkable Sinuses/Orbits: Chronic bilateral ethmoid and maxillary sinusitis. Other: No supplemental non-categorized findings. IMPRESSION: 1. No acute intracranial findings. 2. Periventricular white matter and corona radiata hypodensities favor chronic ischemic microvascular white matter disease.  3. Atherosclerosis. 4. Chronic bilateral ethmoid and maxillary sinusitis. Electronically Signed   By: Van Clines M.D.   On: 07/23/2022 15:14   CT PELVIS W CONTRAST  Result Date: 07/23/2022 CLINICAL DATA:  Purulent drainage left hip.  Altered mental status. EXAM: CT PELVIS WITH CONTRAST TECHNIQUE: Multidetector CT imaging of the pelvis was performed using the standard protocol following the bolus administration of intravenous contrast. RADIATION DOSE REDUCTION: This exam was performed according to the departmental dose-optimization program which includes automated exposure control, adjustment of the mA and/or kV according to patient size and/or use of iterative reconstruction technique. CONTRAST:  56m OMNIPAQUE IOHEXOL 350 MG/ML SOLN COMPARISON:  CT scan 11/04/2021 FINDINGS: Urinary Tract:  Unremarkable Bowel:  Unremarkable Vascular/Lymphatic: Atherosclerosis is present, including aortoiliac atherosclerotic disease. Left external iliac node 1.2 cm in short axis on image 73 series 6, formerly 1.3 cm. Other mildly prominent left external iliac nodes are present. Reproductive: Prostatomegaly. Suspected small right scrotal hydrocele. Other:  No supplemental non-categorized findings. Musculoskeletal: Bony lucency along the left acetabulum measuring about 6.1 by 1.9 cm on image 77 series 6, similar appearance on 11/04/2021, appearance favors particulate disease. Infection is a less likely differential diagnostic consideration for this bony lucency given the lack of substantial progression over the last 9 months. Left total hip prosthesis observed, no substantial abnormal lucency around the stem the femoral component. There is heterotopic ossification posterior to the proximal femur. On images 134 through 147 there is posterior periostitis and cortical thinning along the proximal femoral diaphysis in the vicinity of the linea aspera. Osteomyelitis is not excluded. There is some endosteal demineralization in  this vicinity as well as shown on image 159 series 9. Cutaneous wound overlying the greater trochanter on image 94 series 6. Band of associated soft tissue density extends towards the gluteal musculature, trochanteric bursa, and greater trochanter. No new destructive findings or demineralization of the greater trochanter identified. Streak artifact from the hip implant itself reduces signal to noise ratio in the area of interest. Connectivity of the wound to the joint is difficult to totally exclude. IMPRESSION: 1. Cutaneous wound overlying the left greater trochanter, extending towards the trochanteric bursa and greater trochanter region. Connectivity with the left hip joint is not entirely excluded. 2. Periostitis and cortical thinning along the posterior proximal femoral diaphysis in the vicinity of the linea aspera. Appearance raises suspicion for proximal femoral osteomyelitis. 3. Bony lucency along the left acetabulum measuring 6.1 by 1.9 cm, similar appearance on 11/04/2021, appearance favors particulate disease. Infection is a less likely differential diagnostic consideration for this bony lucency given  the lack of substantial progression over the last 9 months. 4. Left total hip prosthesis. 5. Prostatomegaly. 6. Suspected small right scrotal hydrocele. 7. Mildly prominent left external iliac nodes, nonspecific but probably reactive. 8. Aortic atherosclerosis. Aortic Atherosclerosis (ICD10-I70.0). Electronically Signed   By: Van Clines M.D.   On: 07/23/2022 15:08   DG Chest Portable 1 View  Result Date: 07/23/2022 CLINICAL DATA:  Cough and altered mental status.  COVID positive. EXAM: PORTABLE CHEST 1 VIEW COMPARISON:  Chest x-ray dated November 11, 2021. FINDINGS: Unchanged mild cardiomegaly. Mild bibasilar atelectasis. No pleural effusion or pneumothorax. The visualized skeletal structures are unremarkable. Unchanged elevation of the right hemidiaphragm. IMPRESSION: 1. Mild bibasilar atelectasis.  Electronically Signed   By: Titus Dubin M.D.   On: 07/23/2022 12:12    Microbiology: Recent Results (from the past 240 hour(s))  Resp Panel by RT-PCR (Flu A&B, Covid) Anterior Nasal Swab     Status: Abnormal   Collection Time: 07/23/22 11:35 AM   Specimen: Anterior Nasal Swab  Result Value Ref Range Status   SARS Coronavirus 2 by RT PCR POSITIVE (A) NEGATIVE Final    Comment: (NOTE) SARS-CoV-2 target nucleic acids are DETECTED.  The SARS-CoV-2 RNA is generally detectable in upper respiratory specimens during the acute phase of infection. Positive results are indicative of the presence of the identified virus, but do not rule out bacterial infection or co-infection with other pathogens not detected by the test. Clinical correlation with patient history and other diagnostic information is necessary to determine patient infection status. The expected result is Negative.  Fact Sheet for Patients: EntrepreneurPulse.com.au  Fact Sheet for Healthcare Providers: IncredibleEmployment.be  This test is not yet approved or cleared by the [place] FDA and  has been authorized for detection and/or diagnosis of SARS-CoV-2 by FDA under an Emergency Use Authorization (EUA).  This EUA will remain in effect (meaning this test can be used) for the duration of  the COVID-19 declaration under Section 564(b)(1) of the A ct, 21 U.S.C. section 360bbb-3(b)(1), unless the authorization is terminated or revoked sooner.     Influenza A by PCR NEGATIVE NEGATIVE Final   Influenza B by PCR NEGATIVE NEGATIVE Final    Comment: (NOTE) The Xpert Xpress SARS-CoV-2/FLU/RSV plus assay is intended as an aid in the diagnosis of influenza from Nasopharyngeal swab specimens and should not be used as a sole basis for treatment. Nasal washings and aspirates are unacceptable for Xpert Xpress SARS-CoV-2/FLU/RSV testing.  Fact Sheet for  Patients: EntrepreneurPulse.com.au  Fact Sheet for Healthcare Providers: IncredibleEmployment.be  This test is not yet approved or cleared by the [place] FDA and has been authorized for detection and/or diagnosis of SARS-CoV-2 by FDA under an Emergency Use Authorization (EUA). This EUA will remain in effect (meaning this test can be used) for the duration of the COVID-19 declaration under Section 564(b)(1) of the Act, 21 U.S.C. section 360bbb-3(b)(1), unless the authorization is terminated or revoked.  Performed at [facility], [place] [address] [place], [place] [address]   MRSA Next Gen by PCR, Nasal     Status: None   Collection Time: 07/26/22  4:02 AM   Specimen: Nasal Mucosa; Nasal Swab  Result Value Ref Range Status   MRSA by PCR Next Gen NOT DETECTED NOT DETECTED Final    Comment: (NOTE) The GeneXpert MRSA Assay (FDA approved for NASAL specimens only), is one component of a comprehensive MRSA colonization surveillance program. It is not intended to diagnose MRSA infection nor to  guide or monitor treatment for MRSA infections. Test performance is not FDA approved in patients less than 21 years old. Performed at [facility], [place] [address], [place], [place] [address]      Labs: Basic Metabolic Panel: Recent Labs  Lab 07/23/22 1202 07/24/22 0132 07/26/22 0401 07/27/22 0531  NA 138 137 143 141  K 4.9 4.9 4.8 4.6  CL 100 100 109 105  CO2 28 21* 27 29  GLUCOSE 89 91 125* 94  BUN 52* 41* 33* 29*  CREATININE 1.76* 1.38* 1.28* 1.09  CALCIUM 8.9 8.8* 8.4* 8.1*  PHOS  --   --   --  1.9*   Liver Function Tests: Recent Labs  Lab 07/23/22 1202 07/27/22 0531  AST 21  --   ALT 17  --   ALKPHOS 45  --   BILITOT 0.7  --   PROT 7.2  --   ALBUMIN 2.7* 1.9*   No results for input(s): "LIPASE", "AMYLASE" in the last 168 hours. No results for input(s): "AMMONIA" in the last 168 hours. CBC: Recent Labs  Lab  07/23/22 1202 07/24/22 0132 07/27/22 0531  WBC 7.2 8.2 13.0*  NEUTROABS 4.6  --   --   HGB 10.7* 11.3* 10.0*  HCT 32.3* 34.1* 30.1*  MCV 100.6* 101.5* 100.7*  PLT 185 190 193   Cardiac Enzymes: Recent Labs  Lab 07/26/22 0401  CKTOTAL 294   BNP: BNP (last 3 results) Recent Labs    07/23/22 1202  BNP 119.7*    ProBNP (last 3 results) No results for input(s): "PROBNP" in the last 8760 hours.  CBG: No results for input(s): "GLUCAP" in the last 168 hours.     Signed:  Nita Sells MD   [facility] 07/27/2022, 3:45 PM

## 2022-07-27 NOTE — Progress Notes (Signed)
PHARMACY CONSULT NOTE FOR:  OUTPATIENT  PARENTERAL ANTIBIOTIC THERAPY (OPAT)  Indication: Left hip PJI  Regimen: Daptomycin 650 mg IV Q 24 hours + Ceftriaxone 2 gm IV Q 24 hours  End date: 08/20/22   IV antibiotic discharge orders are pended. To discharging provider:  please sign these orders via discharge navigator,  Select New Orders & click on the button choice - Manage This Unsigned Work.     Thank you for allowing pharmacy to be a part of this patient's care.  Jimmy Footman, PharmD, BCPS, BCIDP Infectious Diseases Clinical Pharmacist Phone: [phone] 07/27/2022, 3:17 PM

## 2022-07-28 ENCOUNTER — Inpatient Hospital Stay: Payer: Self-pay

## 2022-07-28 LAB — RENAL FUNCTION PANEL
Albumin: 2.1 g/dL — ABNORMAL LOW (ref 3.5–5.0)
Anion gap: 7 (ref 5–15)
BUN: 26 mg/dL — ABNORMAL HIGH (ref 8–23)
CO2: 28 mmol/L (ref 22–32)
Calcium: 8.4 mg/dL — ABNORMAL LOW (ref 8.9–10.3)
Chloride: 103 mmol/L (ref 98–111)
Creatinine, Ser: 0.94 mg/dL (ref 0.61–1.24)
GFR, Estimated: >60 mL/min (ref >60)
Glucose, Bld: 94 mg/dL (ref 70–99)
Phosphorus: 3.2 mg/dL (ref 2.5–4.6)
Potassium: 5.2 mmol/L — ABNORMAL HIGH (ref 3.5–5.1)
Sodium: 138 mmol/L (ref 135–145)

## 2022-07-28 LAB — CBC
HCT: 35.1 % — ABNORMAL LOW (ref 39.0–52.0)
Hemoglobin: 11.2 g/dL — ABNORMAL LOW (ref 13.0–17.0)
MCH: 32.7 pg (ref 26.0–34.0)
MCHC: 31.9 g/dL (ref 30.0–36.0)
MCV: 102.6 fL — ABNORMAL HIGH (ref 80.0–100.0)
Platelets: 227 10*3/uL (ref 150–400)
RBC: 3.42 MIL/uL — ABNORMAL LOW (ref 4.22–5.81)
RDW: 12.9 % (ref 11.5–15.5)
WBC: 11.6 10*3/uL — ABNORMAL HIGH (ref 4.0–10.5)
nRBC: 0 % (ref 0.0–0.2)

## 2022-07-28 MED ORDER — SODIUM CHLORIDE 0.9% FLUSH: 10.0000 mL | INTRAVENOUS | Status: DC | PRN

## 2022-07-28 MED ORDER — SODIUM CHLORIDE 0.9% FLUSH
10.0000 mL | Freq: Two times a day (BID) | INTRAVENOUS | Status: DC
  Administered 2022-07-28 – 2022-07-29 (×3): 10 mL

## 2022-07-28 NOTE — Progress Notes (Signed)
Seen and examined at bedside.  Remains stable.  Status post PICC line.  Please refer to discharge instructions done on 07/27/2022 by Dr. Verlon Au.  Stable for disposition.

## 2022-07-28 NOTE — Progress Notes (Signed)
Peripherally Inserted Central Catheter Placement  The IV Nurse has discussed with the patient and/or persons authorized to consent for the patient, the purpose of this procedure and the potential benefits and risks involved with this procedure.  The benefits include less needle sticks, lab draws from the catheter, and the patient may be discharged home with the catheter. Risks include, but not limited to, infection, bleeding, blood clot (thrombus formation), and puncture of an artery; nerve damage and irregular heartbeat and possibility to perform a PICC exchange if needed/ordered by physician.  Alternatives to this procedure were also discussed.  Bard Power PICC patient education guide, fact sheet on infection prevention and patient information card has been provided to patient /or left at bedside.    PICC Placement Documentation  PICC Single Lumen 07/28/22 Left Brachial 43 cm 0 cm (Active)  Indication for Insertion or Continuance of Line Prolonged intravenous therapies 07/28/22 0915  Exposed Catheter (cm) 0 cm 07/28/22 0915  Site Assessment Clean, Dry, Intact 07/28/22 0915  Line Status Flushed;Blood return noted;Saline locked 07/28/22 0915  Dressing Type Transparent 07/28/22 0915  Dressing Status Antimicrobial disc in place 07/28/22 0915  Dressing Change Due 08/04/22 07/28/22 0915       Scotty Court 07/28/2022, 9:17 AM

## 2022-07-28 NOTE — Progress Notes (Signed)
Occupational Therapy Treatment Patient Details Name: Mark Howe MRN: [id number] DOB: 05/11/1935 Today's Date: 07/28/2022   History of present illness 87 y/o male presented to ED on 07/23/22 for AMS from [facility] after being recently dx of COVID. Admitted for acute metabolic encephalopathy and L hip post op wound infection. PMH: HTN, CHF, dementia, CKD   OT comments  Mark Howe is making incremental progress. He had increased LOA this date but perseverated on eating throughout session and had difficulty follow commands for safety. He required min A to get to EOB with increased time. Attempted transfer to the chair, however pt demonstrated poor safety awareness and would not follow commands therefore was a fall risk. Pt then returned supine for safety. OT to continue to follow. POC remains appropriate.    Recommendations for follow up therapy are one component of a multi-disciplinary discharge planning process, led by the attending physician.  Recommendations may be updated based on patient status, additional functional criteria and insurance authorization.    Follow Up Recommendations  Skilled nursing-short term rehab (<3 hours/day)     Assistance Recommended at Discharge Frequent or constant Supervision/Assistance  Patient can return home with the following  A lot of help with walking and/or transfers;A lot of help with bathing/dressing/bathroom;Assistance with cooking/housework;Assistance with feeding;Direct supervision/assist for medications management;Direct supervision/assist for financial management;Help with stairs or ramp for entrance;Assist for transportation   Equipment Recommendations  None recommended by OT       Precautions / Restrictions Precautions Precautions: Fall Precaution Comments: Covid Restrictions Weight Bearing Restrictions: No       Mobility Bed Mobility Overal bed mobility: Needs Assistance Bed Mobility: Supine to Sit, Sit to Supine     Supine to  sit: Min assist Sit to supine: Max assist   General bed mobility comments: max A to return to bed as pt actively resisting    Transfers Overall transfer level: Needs assistance   Transfers: Sit to/from Stand Sit to Stand: Max assist           General transfer comment: did not obtain fully upright, pt not following commands and was at risk for falling.     Balance Overall balance assessment: Needs assistance Sitting-balance support: Feet supported Sitting balance-[name] Scale: Fair     Standing balance support: Bilateral upper extremity supported, During functional activity Standing balance-[name] Scale: Poor                             ADL either performed or assessed with clinical judgement   ADL Overall ADL's : Needs assistance/impaired Eating/Feeding: Set up;Sitting Eating/Feeding Details (indicate cue type and reason): ate pudding sitting EOB                                 Functional mobility during ADLs: Maximal assistance General ADL Comments: attempted chiar transfer, pt being unsafe and as a fall risk. not following commmands    Extremity/Trunk Assessment Upper Extremity Assessment Upper Extremity Assessment: Generalized weakness;Difficult to assess due to impaired cognition   Lower Extremity Assessment Lower Extremity Assessment: Defer to PT evaluation        Vision   Vision Assessment?: No apparent visual deficits          Cognition Arousal/Alertness: Awake/alert Behavior During Therapy: WFL for tasks assessed/performed Overall Cognitive Status: History of cognitive impairments - at baseline  General Comments: followed some commands. perseverating on food. attempted to stand unsafely and not following commands to prevent a fall. Required return to bed              General Comments VSS on RA    Pertinent Vitals/ Pain       Pain Assessment Pain Assessment: Faces Faces  Pain Scale: Hurts even more Pain Location: hip Pain Descriptors / Indicators: Discomfort, Grimacing Pain Intervention(s): Monitored during session, Limited activity within patient's tolerance   Frequency  Min 2X/week        Progress Toward Goals  OT Goals(current goals can now be found in the care plan section)  Progress towards OT goals: Progressing toward goals  Acute Rehab OT Goals Patient Stated Goal: to eat OT Goal Formulation: Patient unable to participate in goal setting Time For Goal Achievement: 08/07/22 Potential to Achieve Goals: Good ADL Goals Pt Will Perform Grooming: with mod assist;bed level Pt Will Perform Upper Body Dressing: with mod assist;sitting Additional ADL Goal #1: Pt will complete bed mobility with mod A as a precursor to ADLs Additional ADL Goal #2: pt will follow simple 1 step commands 50% of the session  Plan Discharge plan remains appropriate       AM-PAC OT "6 Clicks" Daily Activity     Outcome Measure   Help from another person eating meals?: Total Help from another person taking care of personal grooming?: Total Help from another person toileting, which includes using toliet, bedpan, or urinal?: Total Help from another person bathing (including washing, rinsing, drying)?: Total Help from another person to put on and taking off regular upper body clothing?: Total Help from another person to put on and taking off regular lower body clothing?: Total 6 Click Score: 6    End of Session    OT Visit Diagnosis: Unsteadiness on feet (R26.81);Other abnormalities of gait and mobility (R26.89);Muscle weakness (generalized) (M62.81);Pain   Activity Tolerance Patient tolerated treatment well   Patient Left in bed;with call bell/phone within reach;with bed alarm set   Nurse Communication Mobility status        Time: 6962-9528 OT Time Calculation (min): 19 min  Charges: OT General Charges $OT Visit: 1 Visit OT Treatments $Self Care/Home  Management : 8-22 mins    Elliot Cousin 07/28/2022, 5:07 PM

## 2022-07-28 NOTE — TOC Progression Note (Signed)
Transition of Care [facility]) - Initial/Assessment Note    Patient Details  Name: Mark Howe MRN: [id number] Date of Birth: 1935/07/25  [facility] CM/SW Contact:    Milinda Antis, LCSWA Phone Number: 07/28/2022, 2:56 PM  Clinical Narrative:                 LCSW contacted [name] with admissions at [facility]).  The facility can accept the patient tomorrow.    LCSW will continue to follow.  Expected Discharge Plan: Skilled Nursing Facility Barriers to Discharge: Continued Medical Work up   Patient Goals and CMS Choice Patient states their goals for this hospitalization and ongoing recovery are:: Rehab CMS Medicare.gov Compare Post Acute Care list provided to:: Patient Choice offered to / list presented to : Sibling, Patient  Expected Discharge Plan and Services Expected Discharge Plan: [name] In-house Referral: Clinical Social Work   Post Acute Care Choice: [place] Living arrangements for the past 2 months: [name] Expected Discharge Date: 07/28/22                                    Prior Living Arrangements/Services Living arrangements for the past 2 months: [name] Lives with:: Facility Resident Patient language and need for interpreter reviewed:: Yes Do you feel safe going back to the place where you live?: Yes      Need for Family Participation in Patient Care: Yes (Comment) Care giver support system in place?: Yes (comment)   Criminal Activity/Legal Involvement Pertinent to Current Situation/Hospitalization: No - Comment as needed  Activities of Daily Living      Permission Sought/Granted Permission sought to share information with : Family Supports, Chartered certified accountant granted to share information with : Yes, Verbal Permission Granted  Share Information with NAME: [name] (Sister) [phone]  Permission granted to share info w AGENCY:  SNF  Permission granted to share info w Relationship: [name] (Sister) [phone]  Permission granted to share info w Contact Information: [name] (Sister) [phone]  Emotional Assessment Appearance:: Appears stated age Attitude/Demeanor/Rapport: Unable to Assess Affect (typically observed): Unable to Assess Orientation: : Oriented to Self Alcohol / Substance Use: Not Applicable Psych Involvement: No (comment)  Admission diagnosis:  Osteomyelitis ([place]) [M86.9] AKI (acute kidney injury) ([place]) [H63.1] Complicated open wound of left hip, initial encounter [S71.002A] Altered mental status, unspecified altered mental status type [R41.82] COVID-19 [U07.1] Patient Active Problem List   Diagnosis Date Noted   Left hip postoperative wound infection 07/23/2022   COVID-19 virus infection 07/23/2022   Chronic diastolic CHF (congestive heart failure) ([place]) 07/23/2022   Cellulitis of foot 07/23/2022   Macrocytic anemia 07/23/2022   Dementia without behavioral disturbance ([place]) 07/23/2022   Pruritus 01/21/2022   Unspecified protein-calorie malnutrition ([place]) 11/19/2021   Postural hypotension 11/19/2021   Neurocognitive deficits 11/19/2021   Wound healing, delayed 49/70/2637   Acute metabolic encephalopathy 85/88/5027   Hypertension 11/11/2021   Benign prostatic hyperplasia 11/11/2021   Iron deficiency anemia 11/11/2021   Pure hypercholesterolemia 11/11/2021   fall with rhabdomyolysis  11/11/2021   Hematuria 11/11/2021   Acute kidney injury superimposed on chronic kidney disease ([place]) 74/08/8785   Diastolic dysfunction 76/72/0947   Ambulatory dysfunction 11/11/2021   Conjunctivitis 11/11/2021   Elevated AST (SGOT) 11/11/2021   Acute encephalopathy 11/11/2021   PCP:  [name], MD Pharmacy:   [facility] [address] [place] -  [place] [place] [place] [address] Phone: [phone] Fax: [phone]     Social  Determinants of Health (SDOH) Interventions    Readmission Risk Interventions     No data to display

## 2022-07-29 DIAGNOSIS — G9341 Metabolic encephalopathy: Secondary | ICD-10-CM

## 2022-07-29 DIAGNOSIS — U071 COVID-19: Secondary | ICD-10-CM | POA: Diagnosis not present

## 2022-07-29 DIAGNOSIS — N401 Enlarged prostate with lower urinary tract symptoms: Secondary | ICD-10-CM

## 2022-07-29 DIAGNOSIS — N179 Acute kidney failure, unspecified: Secondary | ICD-10-CM

## 2022-07-29 DIAGNOSIS — D539 Nutritional anemia, unspecified: Secondary | ICD-10-CM

## 2022-07-29 DIAGNOSIS — N189 Chronic kidney disease, unspecified: Secondary | ICD-10-CM

## 2022-07-29 DIAGNOSIS — L03119 Cellulitis of unspecified part of limb: Secondary | ICD-10-CM

## 2022-07-29 DIAGNOSIS — R350 Frequency of micturition: Secondary | ICD-10-CM

## 2022-07-29 DIAGNOSIS — T8149XA Infection following a procedure, other surgical site, initial encounter: Secondary | ICD-10-CM | POA: Diagnosis not present

## 2022-07-29 DIAGNOSIS — F0390 Unspecified dementia without behavioral disturbance: Secondary | ICD-10-CM

## 2022-07-29 DIAGNOSIS — I5032 Chronic diastolic (congestive) heart failure: Secondary | ICD-10-CM

## 2022-07-29 LAB — RENAL FUNCTION PANEL
Albumin: 2.1 g/dL — ABNORMAL LOW (ref 3.5–5.0)
Anion gap: 7 (ref 5–15)
BUN: 24 mg/dL — ABNORMAL HIGH (ref 8–23)
CO2: 29 mmol/L (ref 22–32)
Calcium: 8.4 mg/dL — ABNORMAL LOW (ref 8.9–10.3)
Chloride: 100 mmol/L (ref 98–111)
Creatinine, Ser: 0.91 mg/dL (ref 0.61–1.24)
GFR, Estimated: >60 mL/min (ref >60)
Glucose, Bld: 98 mg/dL (ref 70–99)
Phosphorus: 2.7 mg/dL (ref 2.5–4.6)
Potassium: 4.8 mmol/L (ref 3.5–5.1)
Sodium: 136 mmol/L (ref 135–145)

## 2022-07-29 LAB — CBC
HCT: 31.6 % — ABNORMAL LOW (ref 39.0–52.0)
Hemoglobin: 10.6 g/dL — ABNORMAL LOW (ref 13.0–17.0)
MCH: 33.3 pg (ref 26.0–34.0)
MCHC: 33.5 g/dL (ref 30.0–36.0)
MCV: 99.4 fL (ref 80.0–100.0)
Platelets: 194 10*3/uL (ref 150–400)
RBC: 3.18 MIL/uL — ABNORMAL LOW (ref 4.22–5.81)
RDW: 12.4 % (ref 11.5–15.5)
WBC: 12.1 10*3/uL — ABNORMAL HIGH (ref 4.0–10.5)
nRBC: 0 % (ref 0.0–0.2)

## 2022-07-29 NOTE — TOC Transition Note (Signed)
Transition of Care [employer] [facility]) - CM/SW Discharge Note   Patient Details  Name: Mark Howe MRN: [id number] Date of Birth: Feb 01, 1935  [facility] CM/SW Contact:  Milinda Antis, [place] Phone Number: 07/29/2022, 11:59 AM   Clinical Narrative:    Patient will DC to:  [facility] SNF Anticipated DC date: 07/29/2022 Family notified:  Yes (patient's sister [name]) Transport by:  Corey Roddrick   Per MD patient ready for DC to SNF. RN to call report prior to discharge [phone] room 314B). RN, patient's family, and facility notified of DC. Patient's sister requested a call from MD or RN.  LCSW relayed this information to floor RN and MD.  Discharge Summary sent to facility. DC packet on chart. Ambulance transport requested for patient.   CSW will sign off for now as social work intervention is no longer needed. Please consult [place] again if new needs arise.    Final next level of care: Skilled Nursing Facility Barriers to Discharge: Barriers Resolved   Patient Goals and CMS Choice Patient states their goals for this hospitalization and ongoing recovery are:: Rehab CMS Medicare.gov Compare Post Acute Care list provided to:: Patient Choice offered to / list presented to : Sibling, Patient  Discharge Placement              Patient chooses bed at: [facility] Patient to be transferred to facility by: [place] Name of family member notified: [name] (Sister) [phone] Patient and family notified of of transfer: 07/29/22  Discharge Plan and Services In-house Referral: Clinical Social Work   Post Acute Care Choice: [place]                               Social Determinants of Health (SDOH) Interventions     Readmission Risk Interventions     No data to display

## 2022-07-29 NOTE — Progress Notes (Signed)
DISCHARGE NOTE HOME Mark Howe is discharged to Skilled nursing facility per MD order. Report called to receving nurse at the SNF and questions were answered.  Some redness noted to lower legs, no evidence of skin tears noted. PICC line in place and dressing is clean, dry and intact. Insertion site without signs and symptoms of complications. Pt denies pain currently. Alert but confused. No complaints noted.    An After Visit Summary (AVS) was printed and given to Transport.  Neomia Glass, RN

## 2022-07-29 NOTE — Discharge Summary (Signed)
Physician Discharge Summary  Cai Anfinson Hoglund OTL:[id number] DOB: 10/06/1934 DOA: 07/23/2022  PCP: [name], MD  Admit date: 07/23/2022 Discharge date: 07/29/2022  Time spent: 39 minutes  Recommendations for Outpatient Follow-up:  Requires antibiotics through 12/8 as per infectious disease and then clinic visit in the outpatient setting to determine further suppressive therapy Get Chem-12 CBC in 1 week Limit meds that can cause confusion-would not use tramadol opiates etc.-gently redirect as possible Can come off COVID precautions 11/17  Discharge Diagnoses:  MAIN problem for hospitalization   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 cannot confirm organism Coronavirus 19 pneumonia AKI superimposed on CKD 3 Dementia with behavioral disturbance Toxic metabolic encephalopathy on admission Decubitus ulcer  Please see below for itemized issues addressed in [place]- refer to other progress notes for clarity if needed  Discharge Condition: Fair  Diet recommendation: Heart healthy  Filed Weights   07/27/22 0555 07/28/22 0424 07/29/22 0524  Weight: 83 kg 84 kg 84 kg    History of present illness:  87 [facility] with past medical history of HTN HLD BPH with hematuria, Prior pressure ulcers and admission in 11/2021 for rhabdo after having been found down chronic draining left total hip arthroplasty incision seen in 2016 2018 for this-had surgery about 11 years ago-this never really healed. Allegedly tested positive for COVID around 11/8 at facility also with left hip pain-was acting more irritable than usual erratic and confused. BUNs/creatinine 52/1.7. -Lactic acid 1.4, WBC 7.2 hemoglobin 10.7 MCV 100.6 platelets 185, CRP elevated. CT showed no intracranial findings periventricular white matter corona radiata hypodensities favoring chronic ischemia. CXR = mild bibasilar atelectasis. Valproic acid level was 28. Orthopedics was  consulted and recommended MRI of the left hip. Speech therapy to see patient for safety with regards to diet. Patient was started on Rocephin and vancomycin empirically for coverage. ID was consulted as well for Recs and adjusted ABx  Hospital Course:   Hypoxic respiratory failure COVID-19 Secondary to COVID versus encephalopathy from meds - Was on Molnupiravir but this was stopped--was switched Decadron to 4 mg daily, end date 11/20.  On supplemental oxygen with 2 L.  Wean as tolerated.   Draining left hip, CT shows cutaneous wound over left greater trochanteric towards the bursa?  Osteo -Treat empirically with antibiotics-orthopedic has seen--attempted to have IR aspirate the joint but this was unsuccessful - attempt X2, 3MRI unsuccessful -Tramadol was discontinued - ID input appreciated now on daptomycin/ceftriaxone treating for 4 weeks ending 12/8 via PICC line which was placed 11/12   AKI superimposed on CKD 3 - AKI is improved-monitor trends - Continues to improve   Toxic metabolic encephalopathy secondary to infection or underlying dementia superimposed by acute kidney injury - intermittently confused and needed Ativan at times.  Seems close to baseline however now for discharge - Speech therapy saw him and graduated him to dysphagia 2   Dementia with behavioral disturbances - Usually does not have issues with behaviors - As was altered on admission was on IV Depakote-resume Depakot orally on discharge   Discharge Exam: Vitals:   07/28/22 2240 07/29/22 0524  BP: (!) 157/62 (!) 188/75  Pulse: (!) 58 (!) 49  Resp: 16 16  Temp: 98.4 F (36.9 C) 98.4 F (36.9 C)  SpO2: 100% 100%    Subjective   Today, patient was seen and examined.  On room air.  Slightly disoriented but Communicative. States that he feels okay.  Denies any pain, nausea, vomiting, was able to  eat okay.  Denies any shortness of breath or dyspnea.  General Exam on discharge  General:  Average built, not  in obvious distress, alert of Communicative, appearing HENT:   No scleral pallor or icterus noted. Oral mucosa is moist.  Chest:    Diminished breath sounds bilaterally. No crackles or wheezes.  CVS: S1 &S2 heard. No murmur.  Regular rate and rhythm. Abdomen: Soft, nontender, nondistended.  Bowel sounds are heard.   Extremities: No cyanosis, clubbing or edema.  Peripheral pulses are palpable. Psych: Alert, awake and disoriented, has underlying dementia, CNS:  No cranial nerve deficits.  Power equal in all extremities.   Skin: Warm and dry.  No rashes noted.   Discharge Instructions   Discharge Instructions     [facility] pharmacist to adjust dose for Vancomycin, Aminoglycosides and other anti-infective therapies as requested by physician.   Complete by: As directed    [facility] to provide Cath Flo 72m   Complete by: As directed    Administer for PICC line occlusion and as ordered by physician for other access device issues.   Anaphylaxis Kit: Provided to treat any anaphylactic reaction to the medication being provided to the patient if First Dose or when requested by physician   Complete by: As directed    Epinephrine 152mml vial / amp: Administer 0.19m8m0.19ml78mubcutaneously once for moderate to severe anaphylaxis, nurse to call physician and pharmacy when reaction occurs and call 911 if needed for immediate care   Diphenhydramine 50mg62mIV vial: Administer 25-50mg 56mM PRN for first dose reaction, rash, itching, mild reaction, nurse to call physician and pharmacy when reaction occurs   Sodium Chloride 0.9% NS 500ml I19mdminister if needed for hypovolemic blood pressure drop or as ordered by physician after call to physician with anaphylactic reaction   Change dressing on IV access line weekly and PRN   Complete by: As directed    Diet - low sodium heart healthy   Complete by: As directed    Discharge wound care:   Complete by: As directed    essure Injury  11/12/21 Buttocks Right Stage 2 -  Partial thickness loss of dermis presenting as a shallow open injury with a red, pink wound bed without slough. 256 days    Pressure Injury 11/12/21 Vertebral column Upper;Right;Left Stage 2 -  Partial thickness loss of dermis presenting as a shallow open injury with a red, pink wound bed without slough. 256 days   Flush IV access with Sodium Chloride 0.9% and Heparin 10 units/ml or 100 units/ml   Complete by: As directed    Home infusion instructions - [facility]   Complete by: As directed    Instructions: Flush IV access with Sodium Chloride 0.9% and Heparin 10units/ml or 100units/ml   Change dressing on IV access line: Weekly and PRN   Instructions Cath Flo 2mg: Ad25mister for PICC Line occlusion and as ordered by physician for other access device   [facility] pharmacist to adjust dose for: Vancomycin, Aminoglycosides and other anti-infective therapies as requested by physician   Increase activity slowly   Complete by: As directed    Method of administration may be changed at the discretion of home infusion pharmacist based upon assessment of the patient and/or caregiver's ability to self-administer the medication ordered   Complete by: As directed       Allergies as of 07/29/2022   No Known Allergies      Medication List  STOP taking these medications    bisacodyl 10 MG suppository Commonly known as: DULCOLAX   fexofenadine 180 MG tablet Commonly known as: ALLEGRA   guaiFENesin 100 MG/5ML liquid Commonly known as: ROBITUSSIN   ibuprofen 400 MG tablet Commonly known as: ADVIL   MILK OF MAGNESIA PO   RA SALINE ENEMA RE   sodium polystyrene 15 GM/60ML suspension Commonly known as: KAYEXALATE   traMADol 50 MG tablet Commonly known as: ULTRAM       TAKE these medications    acetaminophen 325 MG tablet Commonly known as: TYLENOL Take 650 mg by mouth every 6 (six) hours as needed (pain).   cefTRIAXone   IVPB Commonly known as: ROCEPHIN Inject 2 g into the vein daily for 24 days. Indication:  L hip PJI  First Dose: Yes Last Day of Therapy:  08/20/22 Labs - Once weekly:  CBC/D and BMP, Labs - Every other week:  ESR and CRP Method of administration: IV Push Method of administration may be changed at the discretion of home infusion pharmacist based upon assessment of the patient and/or caregiver's ability to self-administer the medication ordered.   citalopram 20 MG tablet Commonly known as: CELEXA Take 1 tablet (20 mg total) by mouth daily.   daptomycin  IVPB Commonly known as: CUBICIN Inject 650 mg into the vein daily for 24 days. Indication:  L hip PJI  First Dose: Yes Last Day of Therapy:  08/20/22  Labs - Once weekly:  CBC/D, BMP, and CPK Labs - Every other week:  ESR and CRP Method of administration: IV Push Method of administration may be changed at the discretion of home infusion pharmacist based upon assessment of the patient and/or caregiver's ability to self-administer the medication ordered.   dexamethasone 4 MG tablet Commonly known as: DECADRON Take 1 tablet (4 mg total) by mouth daily for 5 days.   divalproex 125 MG capsule Commonly known as: DEPAKOTE SPRINKLE Take 2 capsules (250 mg total) by mouth 3 (three) times daily.   memantine 10 MG tablet Commonly known as: NAMENDA Take 10 mg by mouth at bedtime.   PRO-STAT PO Take 30 mLs by mouth 2 (two) times daily.   tamsulosin 0.4 MG Caps capsule Commonly known as: FLOMAX Take 0.4 mg by mouth at bedtime.   white petrolatum Oint Commonly known as: VASELINE Apply 1 application. topically as needed for dry skin. What changed: when to take this               Discharge Care Instructions  (From admission, onward)           Start     Ordered   07/27/22 0000  Change dressing on IV access line weekly and PRN  (Home infusion instructions - [facility] )        07/27/22 1545   07/27/22 0000   Discharge wound care:       Comments: essure Injury 11/12/21 Buttocks Right Stage 2 -  Partial thickness loss of dermis presenting as a shallow open injury with a red, pink wound bed without slough. 256 days    Pressure Injury 11/12/21 Vertebral column Upper;Right;Left Stage 2 -  Partial thickness loss of dermis presenting as a shallow open injury with a red, pink wound bed without slough. 256 days   07/27/22 1545           No Known Allergies    The results of significant diagnostics from this hospitalization (including imaging, microbiology, ancillary and laboratory) are listed  below for reference.    Significant Diagnostic Studies: [place] EKG Site Rite  Result Date: 07/28/2022 If Site Rite image not attached, placement could not be confirmed due to current cardiac rhythm.  DG FLUORO GUIDED NEEDLE PLC ASPIRATION/INJECTION LOC  Result Date: 07/27/2022 CLINICAL DATA:  Patient with a history of left total hip replacement with postoperative infection and non-healing wound since 2013; history of a chronic draining sinus for almost 10 years. Patient presented to the ED 07/23/22 with altered mental status, hypoxia and pain with weight-bearing to the left leg. Patient found to be COVID positive but there is also concern for left hip periprosthetic joint infection. Radiology team asked to perform left hip aspiration. EXAM: Left hip aspiration PROCEDURE: After a thorough discussion of risks and benefits of the procedure, telephone consent was obtained from the patient's sister. The consent discussion included the risk of bleeding, infection and injury to nerves and adjacent blood vessels. It was also discussed at this procedure may not yield any fluid to send to the lab. Preliminary localization was performed over the left hip. The area was marked over the junction of the left prosthetic head-neck junction. After prep and drape in the usual sterile fashion, the skin and deeper subcutaneous tissues were  anesthetized with 1% Lidocaine without Epinephrine. Under fluoroscopic guidance, a 20 gauge 3.5 inch spinal needle was advanced into the joint at the lateral margin of the junction of the femoral head and neck. The stylet was removed and a syringe was attached to the needle for attempted aspiration. No fluid returned. The needle was repositioned several times without return of fluid. Then, lavage was attempted with 3 mL normal saline, however this was also unable to be re-aspirated. The needle was removed and the site was covered with a bandage. Due to patient condition, additional attempts were not performed. There was no immediate complication. FLUOROSCOPY: Radiation Exposure Index (as provided by the fluoroscopic device): 9.6 mGy Kerma IMPRESSION: No fluid return on attempted left hip aspiration. No immediate complication. This exam was performed by Soyla Dryer, NP, and was supervised and interpreted by Maurine Simmering, MD. Electronically Signed   By: Maurine Simmering M.D.   On: 07/27/2022 14:00   MR FEMUR LEFT WO CONTRAST  Result Date: 07/26/2022 CLINICAL DATA:  Remote left total hip arthroplasty. Reported purulent drainage from left hip at time of recent pelvic CT. EXAM: LIMITED MR OF THE LEFT FEMUR WITHOUT CONTRAST TECHNIQUE: Multiplanar, multisequence MR imaging of the left thigh was attempted. Only coronal T1 weighted images were obtained. Patient was unable to complete the examination. No intravenous contrast was administered. COMPARISON:  Pelvic CT 07/23/2022 and 11/04/2021. FINDINGS: Despite efforts by the technologist and patient, mild motion artifact is present on today's exam and could not be eliminated. This reduces exam sensitivity and specificity. As above, study is limited, consisting of coronal T1 weighted images only. Status post left total hip arthroplasty with associated susceptibility artifact. As seen on previous CTs, there is decreased T1 marrow signal medially within the left acetabulum,  corresponding with chronic lucency on CT and favoring particle disease. Infection is not entirely excluded by this limited examination. Heterotopic ossification surrounding the proximal left femur appears unchanged. Postsurgical changes are present within the soft tissues posterolateral to the left femoral greater trochanter without significant focal fluid collection on this limited study. IMPRESSION: 1. Limited examination, as above, consisting of T1 coronal images only. 2. Status post left total hip arthroplasty with associated susceptibility artifact. Decreased T1 marrow signal medially within  the left acetabulum corresponds with chronic lucency on previous CT and favoring particle disease. Infection is not entirely excluded by this limited examination. 3. Postsurgical changes posterolateral to the left femoral greater trochanter without significant focal fluid collection on this limited study. 4. If further imaging is indicated clinically, options include repeat CT with intravenous contrast, MRI with appropriate patient sedation and nuclear medicine three-phase bone scan. Electronically Signed   By: Richardean Sale M.D.   On: 07/26/2022 13:35   [place] EKG SITE RITE  Result Date: 07/25/2022 If Site Rite image not attached, placement could not be confirmed due to current cardiac rhythm.  CT Head Wo Contrast  Result Date: 07/23/2022 CLINICAL DATA:  Altered mental status, combative EXAM: CT HEAD WITHOUT CONTRAST TECHNIQUE: Contiguous axial images were obtained from the base of the skull through the vertex without intravenous contrast. RADIATION DOSE REDUCTION: This exam was performed according to the departmental dose-optimization program which includes automated exposure control, adjustment of the mA and/or kV according to patient size and/or use of iterative reconstruction technique. COMPARISON:  11/11/2021 FINDINGS: Brain: The brainstem, cerebellum, cerebral peduncles, thalami, basal ganglia, basilar  cisterns, and ventricular system appear within normal limits. Periventricular white matter and corona radiata hypodensities favor chronic ischemic microvascular white matter disease. No intracranial hemorrhage, mass lesion, or acute CVA. Vascular: Bilateral common carotid atherosclerotic calcification noted in the neck. Skull: Unremarkable Sinuses/Orbits: Chronic bilateral ethmoid and maxillary sinusitis. Other: No supplemental non-categorized findings. IMPRESSION: 1. No acute intracranial findings. 2. Periventricular white matter and corona radiata hypodensities favor chronic ischemic microvascular white matter disease. 3. Atherosclerosis. 4. Chronic bilateral ethmoid and maxillary sinusitis. Electronically Signed   By: Van Clines M.D.   On: 07/23/2022 15:14   CT PELVIS W CONTRAST  Result Date: 07/23/2022 CLINICAL DATA:  Purulent drainage left hip.  Altered mental status. EXAM: CT PELVIS WITH CONTRAST TECHNIQUE: Multidetector CT imaging of the pelvis was performed using the standard protocol following the bolus administration of intravenous contrast. RADIATION DOSE REDUCTION: This exam was performed according to the departmental dose-optimization program which includes automated exposure control, adjustment of the mA and/or kV according to patient size and/or use of iterative reconstruction technique. CONTRAST:  73m OMNIPAQUE IOHEXOL 350 MG/ML SOLN COMPARISON:  CT scan 11/04/2021 FINDINGS: Urinary Tract:  Unremarkable Bowel:  Unremarkable Vascular/Lymphatic: Atherosclerosis is present, including aortoiliac atherosclerotic disease. Left external iliac node 1.2 cm in short axis on image 73 series 6, formerly 1.3 cm. Other mildly prominent left external iliac nodes are present. Reproductive: Prostatomegaly. Suspected small right scrotal hydrocele. Other:  No supplemental non-categorized findings. Musculoskeletal: Bony lucency along the left acetabulum measuring about 6.1 by 1.9 cm on image 77 series 6,  similar appearance on 11/04/2021, appearance favors particulate disease. Infection is a less likely differential diagnostic consideration for this bony lucency given the lack of substantial progression over the last 9 months. Left total hip prosthesis observed, no substantial abnormal lucency around the stem the femoral component. There is heterotopic ossification posterior to the proximal femur. On images 134 through 147 there is posterior periostitis and cortical thinning along the proximal femoral diaphysis in the vicinity of the linea aspera. Osteomyelitis is not excluded. There is some endosteal demineralization in this vicinity as well as shown on image 159 series 9. Cutaneous wound overlying the greater trochanter on image 94 series 6. Band of associated soft tissue density extends towards the gluteal musculature, trochanteric bursa, and greater trochanter. No new destructive findings or demineralization of the greater trochanter identified. Streak artifact  from the hip implant itself reduces signal to noise ratio in the area of interest. Connectivity of the wound to the joint is difficult to totally exclude. IMPRESSION: 1. Cutaneous wound overlying the left greater trochanter, extending towards the trochanteric bursa and greater trochanter region. Connectivity with the left hip joint is not entirely excluded. 2. Periostitis and cortical thinning along the posterior proximal femoral diaphysis in the vicinity of the linea aspera. Appearance raises suspicion for proximal femoral osteomyelitis. 3. Bony lucency along the left acetabulum measuring 6.1 by 1.9 cm, similar appearance on 11/04/2021, appearance favors particulate disease. Infection is a less likely differential diagnostic consideration for this bony lucency given the lack of substantial progression over the last 9 months. 4. Left total hip prosthesis. 5. Prostatomegaly. 6. Suspected small right scrotal hydrocele. 7. Mildly prominent left external iliac  nodes, nonspecific but probably reactive. 8. Aortic atherosclerosis. Aortic Atherosclerosis (ICD10-I70.0). Electronically Signed   By: Van Clines M.D.   On: 07/23/2022 15:08   DG Chest Portable 1 View  Result Date: 07/23/2022 CLINICAL DATA:  Cough and altered mental status.  COVID positive. EXAM: PORTABLE CHEST 1 VIEW COMPARISON:  Chest x-ray dated November 11, 2021. FINDINGS: Unchanged mild cardiomegaly. Mild bibasilar atelectasis. No pleural effusion or pneumothorax. The visualized skeletal structures are unremarkable. Unchanged elevation of the right hemidiaphragm. IMPRESSION: 1. Mild bibasilar atelectasis. Electronically Signed   By: Titus Dubin M.D.   On: 07/23/2022 12:12    Microbiology: Recent Results (from the past 240 hour(s))  Resp Panel by RT-PCR (Flu A&B, Covid) Anterior Nasal Swab     Status: Abnormal   Collection Time: 07/23/22 11:35 AM   Specimen: Anterior Nasal Swab  Result Value Ref Range Status   SARS Coronavirus 2 by RT PCR POSITIVE (A) NEGATIVE Final    Comment: (NOTE) SARS-CoV-2 target nucleic acids are DETECTED.  The SARS-CoV-2 RNA is generally detectable in upper respiratory specimens during the acute phase of infection. Positive results are indicative of the presence of the identified virus, but do not rule out bacterial infection or co-infection with other pathogens not detected by the test. Clinical correlation with patient history and other diagnostic information is necessary to determine patient infection status. The expected result is Negative.  Fact Sheet for Patients: EntrepreneurPulse.com.au  Fact Sheet for Healthcare Providers: IncredibleEmployment.be  This test is not yet approved or cleared by the [place] FDA and  has been authorized for detection and/or diagnosis of SARS-CoV-2 by FDA under an Emergency Use Authorization (EUA).  This EUA will remain in effect (meaning this test can be used) for the  duration of  the COVID-19 declaration under Section 564(b)(1) of the A ct, 21 U.S.C. section 360bbb-3(b)(1), unless the authorization is terminated or revoked sooner.     Influenza A by PCR NEGATIVE NEGATIVE Final   Influenza B by PCR NEGATIVE NEGATIVE Final    Comment: (NOTE) The Xpert Xpress SARS-CoV-2/FLU/RSV plus assay is intended as an aid in the diagnosis of influenza from Nasopharyngeal swab specimens and should not be used as a sole basis for treatment. Nasal washings and aspirates are unacceptable for Xpert Xpress SARS-CoV-2/FLU/RSV testing.  Fact Sheet for Patients: EntrepreneurPulse.com.au  Fact Sheet for Healthcare Providers: IncredibleEmployment.be  This test is not yet approved or cleared by the [place] FDA and has been authorized for detection and/or diagnosis of SARS-CoV-2 by FDA under an Emergency Use Authorization (EUA). This EUA will remain in effect (meaning this test can be used) for the duration of the COVID-19 declaration  under Section 564(b)(1) of the Act, 21 U.S.C. section 360bbb-3(b)(1), unless the authorization is terminated or revoked.  Performed at [facility], [place] [address], [place], [place] [address]   MRSA Next Gen by PCR, Nasal     Status: None   Collection Time: 07/26/22  4:02 AM   Specimen: Nasal Mucosa; Nasal Swab  Result Value Ref Range Status   MRSA by PCR Next Gen NOT DETECTED NOT DETECTED Final    Comment: (NOTE) The GeneXpert MRSA Assay (FDA approved for NASAL specimens only), is one component of a comprehensive MRSA colonization surveillance program. It is not intended to diagnose MRSA infection nor to guide or monitor treatment for MRSA infections. Test performance is not FDA approved in patients less than 68 years old. Performed at [facility], [place] [address] [place], [place] [address]      Labs: Basic Metabolic Panel: Recent Labs  Lab 07/24/22 0132 07/26/22 0401  07/27/22 0531 07/28/22 0726 07/29/22 0253  NA 137 143 141 138 136  K 4.9 4.8 4.6 5.2* 4.8  CL 100 109 105 103 100  CO2 21* _0 GLUCOSE 91 125* 94 94 98  BUN 41* 33* 29* 26* 24*  CREATININE 1.38* 1.28* 1.09 0.94 0.91  CALCIUM 8.8* 8.4* 8.1* 8.4* 8.4*  PHOS  --   --  1.9* 3.2 2.7   Liver Function Tests: Recent Labs  Lab 07/23/22 1202 07/27/22 0531 07/28/22 0726 07/29/22 0253  AST 21  --   --   --   ALT 17  --   --   --   ALKPHOS 45  --   --   --   BILITOT 0.7  --   --   --   PROT 7.2  --   --   --   ALBUMIN 2.7* 1.9* 2.1* 2.1*   No results for input(s): "LIPASE", "AMYLASE" in the last 168 hours. No results for input(s): "AMMONIA" in the last 168 hours. CBC: Recent Labs  Lab 07/23/22 1202 07/24/22 0132 07/27/22 0531 07/28/22 0726 07/29/22 0253  WBC 7.2 8.2 13.0* 11.6* 12.1*  NEUTROABS 4.6  --   --   --   --   HGB 10.7* 11.3* 10.0* 11.2* 10.6*  HCT 32.3* 34.1* 30.1* 35.1* 31.6*  MCV 100.6* 101.5* 100.7* 102.6* 99.4  PLT 185 190 193 227 194   Cardiac Enzymes: Recent Labs  Lab 07/26/22 0401  CKTOTAL 294   BNP: BNP (last 3 results) Recent Labs    07/23/22 1202  BNP 119.7*    ProBNP (last 3 results) No results for input(s): "PROBNP" in the last 8760 hours.  CBG: No results for input(s): "GLUCAP" in the last 168 hours.     Signed:  Flora Lipps MD   [facility] 07/29/2022, 7:53 AM

## 2022-08-01 DIAGNOSIS — R1312 Dysphagia, oropharyngeal phase: Secondary | ICD-10-CM | POA: Diagnosis not present

## 2022-08-01 DIAGNOSIS — S71002D Unspecified open wound, left hip, subsequent encounter: Secondary | ICD-10-CM | POA: Diagnosis not present

## 2022-08-01 DIAGNOSIS — G8929 Other chronic pain: Secondary | ICD-10-CM | POA: Diagnosis not present

## 2022-08-03 DIAGNOSIS — I10 Essential (primary) hypertension: Secondary | ICD-10-CM | POA: Diagnosis not present

## 2022-08-03 DIAGNOSIS — S71002D Unspecified open wound, left hip, subsequent encounter: Secondary | ICD-10-CM | POA: Diagnosis not present

## 2022-08-03 DIAGNOSIS — R410 Disorientation, unspecified: Secondary | ICD-10-CM | POA: Diagnosis not present

## 2022-08-03 DIAGNOSIS — G8929 Other chronic pain: Secondary | ICD-10-CM | POA: Diagnosis not present

## 2022-08-05 DIAGNOSIS — Z79899 Other long term (current) drug therapy: Secondary | ICD-10-CM | POA: Diagnosis not present

## 2022-08-05 DIAGNOSIS — I10 Essential (primary) hypertension: Secondary | ICD-10-CM | POA: Diagnosis not present

## 2022-08-06 DIAGNOSIS — E559 Vitamin D deficiency, unspecified: Secondary | ICD-10-CM | POA: Diagnosis not present

## 2022-08-06 DIAGNOSIS — E119 Type 2 diabetes mellitus without complications: Secondary | ICD-10-CM | POA: Diagnosis not present

## 2022-08-06 DIAGNOSIS — E7220 Disorder of urea cycle metabolism, unspecified: Secondary | ICD-10-CM | POA: Diagnosis not present

## 2022-08-06 DIAGNOSIS — D509 Iron deficiency anemia, unspecified: Secondary | ICD-10-CM | POA: Diagnosis not present

## 2022-08-13 ENCOUNTER — Emergency Department (HOSPITAL_COMMUNITY)
Admission: EM | Admit: 2022-08-13 | Discharge: 2022-08-14 | Disposition: A | Discharge status: Home / Self Care | Payer: Medicare Other | Attending: Emergency Medicine | Admitting: Emergency Medicine

## 2022-08-13 DIAGNOSIS — Y829 Unspecified medical devices associated with adverse incidents: Secondary | ICD-10-CM | POA: Insufficient documentation

## 2022-08-13 DIAGNOSIS — Z452 Encounter for adjustment and management of vascular access device: Secondary | ICD-10-CM | POA: Diagnosis not present

## 2022-08-13 DIAGNOSIS — T82524D Displacement of infusion catheter, subsequent encounter: Secondary | ICD-10-CM | POA: Insufficient documentation

## 2022-08-13 DIAGNOSIS — Z96641 Presence of right artificial hip joint: Secondary | ICD-10-CM | POA: Diagnosis not present

## 2022-08-13 DIAGNOSIS — F0390 Unspecified dementia without behavioral disturbance: Secondary | ICD-10-CM | POA: Diagnosis not present

## 2022-08-13 DIAGNOSIS — R69 Illness, unspecified: Secondary | ICD-10-CM | POA: Diagnosis not present

## 2022-08-13 DIAGNOSIS — T82898D Other specified complication of vascular prosthetic devices, implants and grafts, subsequent encounter: Secondary | ICD-10-CM

## 2022-08-13 NOTE — ED Triage Notes (Signed)
Pt BIB [facility] from [facility] d/t pt pulling out his PICC line per staff. Pt has PICC line for antibiotics d/t hip infection. Pt at baseline. VSS

## 2022-08-14 ENCOUNTER — Encounter (HOSPITAL_COMMUNITY): Payer: Self-pay | Admitting: Emergency Medicine

## 2022-08-14 ENCOUNTER — Emergency Department: Payer: Self-pay

## 2022-08-14 ENCOUNTER — Other Ambulatory Visit: Payer: Self-pay

## 2022-08-14 MED ORDER — LIDOCAINE-EPINEPHRINE-TETRACAINE (LET) TOPICAL GEL: 3.0000 mL | Freq: Once | TOPICAL | Status: DC

## 2022-08-14 MED ORDER — CHLORHEXIDINE GLUCONATE CLOTH 2 % EX PADS: 6.0000 | MEDICATED_PAD | Freq: Every day | CUTANEOUS | Status: DC

## 2022-08-14 MED ORDER — SODIUM CHLORIDE 0.9% FLUSH: 10.0000 mL | Freq: Two times a day (BID) | INTRAVENOUS | Status: DC

## 2022-08-14 MED ORDER — LIDOCAINE HCL (PF) 1 % IJ SOLN
5.0000 mL | Freq: Once | INTRAMUSCULAR | Status: DC
  Filled 2022-08-14: qty 5

## 2022-08-14 MED ORDER — SODIUM CHLORIDE 0.9% FLUSH: 10.0000 mL | INTRAVENOUS | Status: DC | PRN

## 2022-08-14 NOTE — Discharge Instructions (Signed)
PICC line was replaced today.  Return to the ED if you have new or concerning symptoms.  Otherwise continue doing the antibiotics as prescribed.

## 2022-08-14 NOTE — ED Provider Notes (Signed)
Case was signed out to me at shift change.  Plan is for patient to have PICC line put in, IR consult was placed.  Physical Exam  BP (!) 154/80 (BP Location: Right Arm)   Pulse 80   Temp 98.9 F (37.2 C)   Resp 16   SpO2 94%   Physical Exam Vitals and nursing note reviewed. Exam conducted with a chaperone present.  Constitutional:      General: He is not in acute distress.    Appearance: Normal appearance.  HENT:     Head: Normocephalic and atraumatic.  Eyes:     General: No scleral icterus.    Extraocular Movements: Extraocular movements intact.     Pupils: Pupils are equal, round, and reactive to light.  Skin:    Coloration: Skin is not jaundiced.  Neurological:     Mental Status: He is alert. Mental status is at baseline.     Coordination: Coordination normal.     Procedures  Procedures  ED Course / MDM    Medical Decision Making Risk OTC drugs. Prescription drug management.   IR team reached out to me stating IV team can change out a PICC line.  I put in consult for IV team to replace PICC line.  I helped secure the PICC line with a single 3-0 Prolene suture.    PICC line is functional, appropriate for outpatient follow-up.     Sherrill Raring, PA-C 08/14/22 1153    Lacretia Leigh, MD 08/15/22 [phone]

## 2022-08-14 NOTE — ED Notes (Signed)
Pt had full linen change, brief change, and condom cath applied.

## 2022-08-14 NOTE — Progress Notes (Signed)
Peripherally Inserted Central Catheter Placement  The IV Nurse has discussed with the patient and/or persons authorized to consent for the patient, the purpose of this procedure and the potential benefits and risks involved with this procedure.  The benefits include less needle sticks, lab draws from the catheter, and the patient may be discharged home with the catheter. Risks include, but not limited to, infection, bleeding, blood clot (thrombus formation), and puncture of an artery; nerve damage and irregular heartbeat and possibility to perform a PICC exchange if needed/ordered by physician.  Alternatives to this procedure were also discussed.  Bard Power PICC patient education guide, fact sheet on infection prevention and patient information card has been provided to patient /or left at bedside.  Telephone consent obtained from sister.  PICC Placement Documentation  PICC Single Lumen 08/14/22 Right Brachial 35 cm 0 cm (Active)  Indication for Insertion or Continuance of Line Home intravenous therapies (PICC only) 08/14/22 1143  Exposed Catheter (cm) 0 cm 08/14/22 1143  Site Assessment Clean, Dry, Intact 08/14/22 1143  Line Status Flushed;Saline locked;Blood return noted 08/14/22 1143  Dressing Type Transparent;Other (Comment) 08/14/22 1143  Dressing Status Antimicrobial disc in place;Clean, Dry, Intact 08/14/22 1143  Safety Lock Not Applicable 26/37/85 8850  Line Care Connections checked and tightened 08/14/22 1143  Line Adjustment (NICU/IV Team Only) No 08/14/22 1143  Dressing Intervention New dressing 08/14/22 1143  Dressing Change Due 08/21/22 08/14/22 [place]       Rolena Infante 08/14/2022, 11:44 AM

## 2022-08-14 NOTE — ED Provider Notes (Signed)
[facility] EMERGENCY DEPARTMENT Provider Note   CSN: [id number] Arrival date & time: 08/13/22  2320     History  Chief Complaint  Patient presents with   Vascular Access Problem    Mark Howe is a 87 y.o. male.  87 year old male with past medical history of dementia, right prosthetic hip infection status post extended antibiotic course.  He had a PICC line placed for this.  PICC line was pulled out at some point earlier in the evening.  He presents to have this replaced.  End of antibiotic therapy is 12/8.  No active bleeding or other concerns.  Complains of arthralgias mostly in his hip, knees.  The history is provided by the patient. No language interpreter was used.       Home Medications Prior to Admission medications   Medication Sig Start Date End Date Taking? Authorizing Provider  acetaminophen (TYLENOL) 325 MG tablet Take 650 mg by mouth every 6 (six) hours as needed (pain).    [provider]  Amino Acids-Protein Hydrolys (PRO-STAT PO) Take 30 mLs by mouth 2 (two) times daily.    [provider]  cefTRIAXone (ROCEPHIN) IVPB Inject 2 g into the vein daily for 24 days. Indication:  L hip PJI  First Dose: Yes Last Day of Therapy:  08/20/22 Labs - Once weekly:  CBC/D and BMP, Labs - Every other week:  ESR and CRP Method of administration: IV Push Method of administration may be changed at the discretion of home infusion pharmacist based upon assessment of the patient and/or caregiver's ability to self-administer the medication ordered. 07/27/22 08/20/22  Nita Sells, MD  citalopram (CELEXA) 20 MG tablet Take 1 tablet (20 mg total) by mouth daily. 07/27/22   Nita Sells, MD  daptomycin (CUBICIN) IVPB Inject 650 mg into the vein daily for 24 days. Indication:  L hip PJI  First Dose: Yes Last Day of Therapy:  08/20/22  Labs - Once weekly:  CBC/D, BMP, and CPK Labs - Every other week:  ESR and CRP Method of  administration: IV Push Method of administration may be changed at the discretion of home infusion pharmacist based upon assessment of the patient and/or caregiver's ability to self-administer the medication ordered. 07/27/22 08/20/22  Nita Sells, MD  divalproex (DEPAKOTE SPRINKLE) 125 MG capsule Take 2 capsules (250 mg total) by mouth 3 (three) times daily. 07/27/22   Nita Sells, MD  memantine (NAMENDA) 10 MG tablet Take 10 mg by mouth at bedtime.    [provider]  tamsulosin (FLOMAX) 0.4 MG CAPS capsule Take 0.4 mg by mouth at bedtime.    [provider]  white petrolatum (VASELINE) OINT Apply 1 application. topically as needed for dry skin. Patient taking differently: Apply 1 application  topically daily as needed for dry skin. 11/17/21   Ghimire, Henreitta Leber, MD      Allergies    Patient has no known allergies.    Review of Systems   Review of Systems  Respiratory:  Negative for shortness of breath.   Cardiovascular:  Negative for chest pain.  Gastrointestinal:  Negative for abdominal pain.  Musculoskeletal:  Positive for arthralgias.  All other systems reviewed and are negative.   Physical Exam Updated Vital Signs BP 135/70   Pulse 79   Temp 98.2 F (36.8 C) (Oral)   Resp 17   SpO2 96%  Physical Exam Vitals and nursing note reviewed.  Constitutional:      General: He is not in  acute distress.    Appearance: Normal appearance. He is not ill-appearing.  HENT:     Head: Normocephalic and atraumatic.     Nose: Nose normal.  Eyes:     General: No scleral icterus.    Extraocular Movements: Extraocular movements intact.     Conjunctiva/sclera: Conjunctivae normal.  Cardiovascular:     Rate and Rhythm: Normal rate and regular rhythm.     Pulses: Normal pulses.  Pulmonary:     Effort: Pulmonary effort is normal. No respiratory distress.     Breath sounds: No wheezing.  Abdominal:     General: There is no distension.     Tenderness: There  is no abdominal tenderness.  Musculoskeletal:        General: Normal range of motion.     Cervical back: Normal range of motion.     Comments: Good range of motion in all major joints of bilateral lower extremities and upper extremities.  Left upper extremity has a dressing in place.  Dressing removed.  PICC line site seen.  No active bleeding.  No signs of infection.  Skin:    General: Skin is warm and dry.  Neurological:     General: No focal deficit present.     Mental Status: He is alert. Mental status is at baseline.     ED Results / Procedures / Treatments   Labs (all labs ordered are listed, but only abnormal results are displayed) Labs Reviewed - No data to display  EKG None  Radiology No results found.  Procedures Procedures    Medications Ordered in ED Medications - No data to display  ED Course/ Medical Decision Making/ A&P                           Medical Decision Making  87 year old male with past medical history of dementia, left prosthetic hip infection presents today for evaluation of displaced PICC line.  Currently on IV antibiotics until 12/8.  No acute distress or concerns.  Patient discussed and evaluated by attending as well.  Will have patient stay in the emergency room for IR consult in AM for PICC line placement.  Patient had a right shift awaiting IR evaluation.  IR radiologist eval and management order placed.  Discussed with [facility] radiology to notify interventional radiologist with a MRI to put patient on their list for PICC line placement.  Patient signed out to oncoming provider [facility].  Final Clinical Impression(s) / ED Diagnoses Final diagnoses:  None    Rx / DC Orders ED Discharge Orders     None         Evlyn Courier, PA-C 08/14/22 [id number]    Ripley Fraise, MD 08/14/22 [phone]

## 2022-08-14 NOTE — ED Notes (Signed)
Ok to eat per [place] PA. Sandwich given.

## 2022-08-16 DIAGNOSIS — I10 Essential (primary) hypertension: Secondary | ICD-10-CM | POA: Diagnosis not present

## 2022-08-18 DIAGNOSIS — L309 Dermatitis, unspecified: Secondary | ICD-10-CM | POA: Diagnosis not present

## 2022-08-19 DIAGNOSIS — L299 Pruritus, unspecified: Secondary | ICD-10-CM | POA: Diagnosis not present

## 2022-08-20 DIAGNOSIS — D509 Iron deficiency anemia, unspecified: Secondary | ICD-10-CM | POA: Diagnosis not present

## 2022-08-20 DIAGNOSIS — S71002D Unspecified open wound, left hip, subsequent encounter: Secondary | ICD-10-CM | POA: Diagnosis not present

## 2022-08-20 DIAGNOSIS — E7220 Disorder of urea cycle metabolism, unspecified: Secondary | ICD-10-CM | POA: Diagnosis not present

## 2022-08-20 DIAGNOSIS — E559 Vitamin D deficiency, unspecified: Secondary | ICD-10-CM | POA: Diagnosis not present

## 2022-08-20 DIAGNOSIS — I10 Essential (primary) hypertension: Secondary | ICD-10-CM | POA: Diagnosis not present

## 2022-08-20 DIAGNOSIS — E119 Type 2 diabetes mellitus without complications: Secondary | ICD-10-CM | POA: Diagnosis not present

## 2022-08-20 DIAGNOSIS — L299 Pruritus, unspecified: Secondary | ICD-10-CM | POA: Diagnosis not present

## 2022-08-21 DIAGNOSIS — N390 Urinary tract infection, site not specified: Secondary | ICD-10-CM | POA: Diagnosis not present

## 2022-08-21 DIAGNOSIS — Z13228 Encounter for screening for other metabolic disorders: Secondary | ICD-10-CM | POA: Diagnosis not present

## 2022-08-23 ENCOUNTER — Inpatient Hospital Stay: Payer: Medicare Other | Admitting: Internal Medicine

## 2022-08-23 DIAGNOSIS — L299 Pruritus, unspecified: Secondary | ICD-10-CM | POA: Diagnosis not present

## 2022-08-23 NOTE — Progress Notes (Deleted)
Patient: Mark Howe  DOB: [birth year] MRN: [id number] PCP: [name], MD   Patient Active Problem List   Diagnosis Date Noted   Left hip postoperative wound infection 07/23/2022   COVID-19 virus infection 07/23/2022   Chronic diastolic CHF (congestive heart failure) ([place]) 07/23/2022   Cellulitis of foot 07/23/2022   Macrocytic anemia 07/23/2022   Dementia without behavioral disturbance ([place]) 07/23/2022   Pruritus 01/21/2022   Unspecified protein-calorie malnutrition ([place]) 11/19/2021   Postural hypotension 11/19/2021   Neurocognitive deficits 11/19/2021   Wound healing, delayed 28/78/6767   Acute metabolic encephalopathy 20/94/7096   Hypertension 11/11/2021   Benign prostatic hyperplasia 11/11/2021   Iron deficiency anemia 11/11/2021   Pure hypercholesterolemia 11/11/2021   fall with rhabdomyolysis  11/11/2021   Hematuria 11/11/2021   Acute kidney injury superimposed on chronic kidney disease ([place]) 28/36/6294   Diastolic dysfunction 76/54/6503   Ambulatory dysfunction 11/11/2021   Conjunctivitis 11/11/2021   Elevated AST (SGOT) 11/11/2021   Acute encephalopathy 11/11/2021     Subjective:  Mark Howe is a 87 y.o. 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  He was admitted to [facility] 11/10-11 16 with worsening altered mental status hypoxia.  Chest x-ray only showed mild bibasilar atelectasis, found to be COVID-positive treated at [facility] ER.  CT pelvis shows cutaneous wound overlying left greater trochanter care extending toward bursa with periostitis.  Unable to do MRI due to agitation, ESR elevated at 107.  Orthopedics were engaged and recommended IR to do aspiration.  Patient started on Vanco and ceftriaxone then transition to Dapto and ceftriaxone x 4 weeks EOT 12/8.  Prior to discharge, new PICC line  placed. The ED on 12/2 since discharge with pulled PICC line, new PICC line placed  ROS  Past Medical History:  Diagnosis Date   Arthritis    L hip & R knee- OA   Cancer ([place])    basal cell CA- face   Shortness of breath     Outpatient Medications Prior to Visit  Medication Sig Dispense Refill   acetaminophen (TYLENOL) 325 MG tablet Take 650 mg by mouth every 6 (six) hours as needed (pain).     Amino Acids-Protein Hydrolys (PRO-STAT PO) Take 30 mLs by mouth 2 (two) times daily.     citalopram (CELEXA) 20 MG tablet Take 1 tablet (20 mg total) by mouth daily. 20 tablet 0   divalproex (DEPAKOTE SPRINKLE) 125 MG capsule Take 2 capsules (250 mg total) by mouth 3 (three) times daily. 20 capsule 0   memantine (NAMENDA) 10 MG tablet Take 10 mg by mouth at bedtime.     tamsulosin (FLOMAX) 0.4 MG CAPS capsule Take 0.4 mg by mouth at bedtime.     white petrolatum (VASELINE) OINT Apply 1 application. topically as needed for dry skin. (Patient taking differently: Apply 1 application  topically daily as needed for dry skin.)  0   No facility-administered medications prior to visit.     No Known Allergies  Social History   Tobacco Use   Smoking status: Former    Types: Cigarettes    Quit date: 11/18/2008    Years since quitting: 13.7   Smokeless tobacco: Never  Substance Use Topics   Alcohol use: No   Drug use: No    Family History  Problem Relation Age of Onset  Anesthesia problems Neg Hx     Objective:  There were no vitals filed for this visit. There is no height or weight on file to calculate BMI.  Physical Exam  Lab Results: Lab Results  Component Value Date   WBC 12.1 (H) 07/29/2022   HGB 10.6 (L) 07/29/2022   HCT 31.6 (L) 07/29/2022   MCV 99.4 07/29/2022   PLT 194 07/29/2022    Lab Results  Component Value Date   CREATININE 0.91 07/29/2022   BUN 24 (H) 07/29/2022   NA 136 07/29/2022   K 4.8 07/29/2022   CL 100 07/29/2022   CO2 29 07/29/2022    Lab Results   Component Value Date   ALT 17 07/23/2022   AST 21 07/23/2022   ALKPHOS 45 07/23/2022   BILITOT 0.7 07/23/2022     Assessment & Plan:  87 year old male presents for hospital follow-up of left prosthetic hip infection. #Left hip PJI - CT pelvis with contrast on 11/10 showed cutaneous wound overlying left greater trochanter extending towards trochanteric bursa, periostitis and cortical thinning along posterior proximal femoral diaphysis, suspicious for proximal femoral osteomyelitis. - Ortho was engaged and recommended antibiotics and consideration of I&D if antibiotics failed - IR attempted aspiration but was unsuccessful.  Hospital course was complicated PICC line being pulled out which was placed again, he was discharged daptomycin and ceftriaxone.  4 weeks EOT 12/8 with plan to transition to Doxy and cefadroxil for suppression if hardware is not explanted.   Laurice Record, MD [place] [facility]   08/23/22  8:35 AM

## 2022-08-24 ENCOUNTER — Other Ambulatory Visit: Payer: Self-pay

## 2022-08-24 ENCOUNTER — Encounter: Payer: Self-pay | Admitting: Internal Medicine

## 2022-08-24 ENCOUNTER — Ambulatory Visit (INDEPENDENT_AMBULATORY_CARE_PROVIDER_SITE_OTHER): Payer: Medicare Other | Admitting: Internal Medicine

## 2022-08-24 VITALS — BP 144/75 | HR 55 | Temp 97.8°F

## 2022-08-24 DIAGNOSIS — I5032 Chronic diastolic (congestive) heart failure: Secondary | ICD-10-CM | POA: Diagnosis not present

## 2022-08-24 DIAGNOSIS — M869 Osteomyelitis, unspecified: Secondary | ICD-10-CM

## 2022-08-24 DIAGNOSIS — I10 Essential (primary) hypertension: Secondary | ICD-10-CM | POA: Diagnosis not present

## 2022-08-24 MED ORDER — CEFADROXIL 500 MG PO CAPS: 1000.0000 mg | ORAL_CAPSULE | Freq: Two times a day (BID) | ORAL | 0 refills | Status: AC

## 2022-08-24 MED ORDER — DOXYCYCLINE HYCLATE 100 MG PO TABS: 100.0000 mg | ORAL_TABLET | Freq: Two times a day (BID) | ORAL | 0 refills | Status: AC

## 2022-08-24 NOTE — Progress Notes (Signed)
Patient Active Problem List   Diagnosis Date Noted   Left hip postoperative wound infection 07/23/2022   COVID-19 virus infection 07/23/2022   Chronic diastolic CHF (congestive heart failure) ([place]) 07/23/2022   Cellulitis of foot 07/23/2022   Macrocytic anemia 07/23/2022   Dementia without behavioral disturbance ([place]) 07/23/2022   Pruritus 01/21/2022   Unspecified protein-calorie malnutrition ([place]) 11/19/2021   Postural hypotension 11/19/2021   Neurocognitive deficits 11/19/2021   Wound healing, delayed 38/18/2993   Acute metabolic encephalopathy 71/69/6789   Hypertension 11/11/2021   Benign prostatic hyperplasia 11/11/2021   Iron deficiency anemia 11/11/2021   Pure hypercholesterolemia 11/11/2021   fall with rhabdomyolysis  11/11/2021   Hematuria 11/11/2021   Acute kidney injury superimposed on chronic kidney disease ([place]) 38/06/1750   Diastolic dysfunction 02/58/5277   Ambulatory dysfunction 11/11/2021   Conjunctivitis 11/11/2021   Elevated AST (SGOT) 11/11/2021   Acute encephalopathy 11/11/2021    Patient's Medications  New Prescriptions   No medications on file  Previous Medications   ACETAMINOPHEN (TYLENOL) 325 MG TABLET    Take 650 mg by mouth every 6 (six) hours as needed (pain).   AMINO ACIDS-PROTEIN HYDROLYS (PRO-STAT PO)    Take 30 mLs by mouth 2 (two) times daily.   CITALOPRAM (CELEXA) 20 MG TABLET    Take 1 tablet (20 mg total) by mouth daily.   DIVALPROEX (DEPAKOTE SPRINKLE) 125 MG CAPSULE    Take 2 capsules (250 mg total) by mouth 3 (three) times daily.   MEMANTINE (NAMENDA) 10 MG TABLET    Take 10 mg by mouth at bedtime.   TAMSULOSIN (FLOMAX) 0.4 MG CAPS CAPSULE    Take 0.4 mg by mouth at bedtime.   WHITE PETROLATUM (VASELINE) OINT    Apply 1 application. topically as needed for dry skin.  Modified Medications   No medications on file  Discontinued Medications   No medications on file    Subjective: 87 year old male with with past medical  history including SNF resident with dementia, hypertension, BPH, THA with chronic left hip draining wound presents for hospital follow-up of left prosthetic hip infection.  Patient has had a chronic wound and been on intermittent antibiotics for 10 years prior to admission.  He was admitted from 11/2 to [facility] due to altered mental status, hypoxia found to have [place] for atelectasis of chest syndrome, COVID-positive.  CT pelvis showed cutaneous wound overlying the left greater trochanter extending to trochanteric bursa and greater trochanter region, connective activity to left hip joint cannot be excluded.  Periostitis and cortical thinning femoral diaphysis suspicious for femoral osteomyelitis.  Orthopedics was engaged and recommended antibiotics, IR for aspiration.  IR engaged and aspiration was unsuccessful.  During hospitalization he pulled his PEG new PICC was placed he was discharged on ceftriaxone x 4 weeks EOT 12/8.  Treated with molnupiravir and Decadron for COVID.  In the interim presented to the ED on 12/1, PICC line was out, [place] PICC placed. Pt presents with family today. Tolerating antibiotics.   Review of Systems: Review of Systems  All other systems reviewed and are negative.   Past Medical History:  Diagnosis Date   Arthritis    L hip & R knee- OA   Cancer ([place])    basal cell CA- face   Shortness of breath     Social History   Tobacco Use   Smoking status: Former    Types: Cigarettes    Quit date: 11/18/2008  Years since quitting: 13.7   Smokeless tobacco: Never  Substance Use Topics   Alcohol use: No   Drug use: No    Family History  Problem Relation Age of Onset   Anesthesia problems Neg Hx     No Known Allergies  Health Maintenance  Topic Date Due   Medicare Annual Wellness (AWV)  Never done   Zoster Vaccines- Shingrix (1 of 2) Never done   INFLUENZA VACCINE  04/13/2022   COVID-19 Vaccine (4 - 2023-24 season) 05/14/2022   DTaP/Tdap/Td (3 - Td or  Tdap) 04/14/2027   Pneumonia Vaccine 40+ Years old  Completed   HPV VACCINES  Aged Out    Objective:  There were no vitals filed for this visit. There is no height or weight on file to calculate BMI.  Physical Exam Constitutional:      General: He is not in acute distress.    Appearance: He is normal weight. He is not toxic-appearing.  HENT:     Head: Normocephalic and atraumatic.     Right Ear: External ear normal.     Left Ear: External ear normal.     Nose: No congestion or rhinorrhea.     Mouth/Throat:     Mouth: Mucous membranes are moist.     Pharynx: Oropharynx is clear.  Eyes:     Extraocular Movements: Extraocular movements intact.     Conjunctiva/sclera: Conjunctivae normal.     Pupils: Pupils are equal, round, and reactive to light.  Cardiovascular:     Rate and Rhythm: Normal rate and regular rhythm.     Heart sounds: No murmur heard.    No friction rub. No gallop.  Pulmonary:     Effort: Pulmonary effort is normal.     Breath sounds: Normal breath sounds.  Abdominal:     General: Abdomen is flat. Bowel sounds are normal.     Palpations: Abdomen is soft.  Musculoskeletal:        General: No swelling. Normal range of motion.     Cervical back: Normal range of motion and neck supple.  Skin:    General: Skin is warm and dry.  Neurological:     General: No focal deficit present.     Mental Status: He is oriented to person, place, and time.  Psychiatric:        Mood and Affect: Mood normal.     Lab Results Lab Results  Component Value Date   WBC 12.1 (H) 07/29/2022   HGB 10.6 (L) 07/29/2022   HCT 31.6 (L) 07/29/2022   MCV 99.4 07/29/2022   PLT 194 07/29/2022    Lab Results  Component Value Date   CREATININE 0.91 07/29/2022   BUN 24 (H) 07/29/2022   NA 136 07/29/2022   K 4.8 07/29/2022   CL 100 07/29/2022   CO2 29 07/29/2022    Lab Results  Component Value Date   ALT 17 07/23/2022   AST 21 07/23/2022   ALKPHOS 45 07/23/2022   BILITOT 0.7  07/23/2022    No results found for: "CHOL", "HDL", "LDLCALC", "LDLDIRECT", "TRIG", "CHOLHDL" No results found for: "LABRPR", "RPRTITER" No results found for: "HIV1RNAQUANT", "HIV1RNAVL", "CD4TABS"   A/P  #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 CT showed possible cutaneous tract to joint. Seen by ortho inpatient and recommended abx. I think  given that HWR is in place will likely need chronic suppression.  -  Completed full weeks of ceftriaxone and daptomycin EOT 12/8 - Wound today, looked clean today, no drainage, surrounding erythema as such pull PICC today. No labs sent, as such will get labs in clinic -Given pt's wound improved on antibiotics will do an additional 2 weeks of antibiotics to complete 6+ weeks(6 weeks would be 12/26 as such would continue PO abx till pt seen in clinic) Plan -D/C ctx and daptomycin. Pulled PICC in clinic today 12/12. -Start doxycyline '100mg'$  pO bid and cefadroxil 1gm PO bid(till 1/2). Then at visit will plan to switch to doxycyline for chronic suppression.  -Labs today -F/u in ID clinic on 1/2 -Pt's daughter states they have not seen ortho following discharge. As such F/U with Ortho.  -SNF: PLEASE SEND LABS  I have personally spent 60 minutes involved in face-to-face and non-face-to-face activities for this patient on the day of the visit. Professional time spent includes the following activities: Preparing to see the patient (review of tests), Obtaining and/or reviewing separately obtained history (admission/discharge record), Performing a medically appropriate examination and/or evaluation , Ordering medications/tests/procedures, referring and communicating with other health care professionals, Documenting clinical information in the EMR, Independently interpreting results (not separately reported), Communicating results to the patient/family/caregiver,  Counseling and educating the patient/family/caregiver and Care coordination (not separately reported).    Laurice Record, MD [place] [facility] 08/24/2022, 1:44 PM

## 2022-08-24 NOTE — Progress Notes (Signed)
Labs drawn via PICC line per Dr. Candiss Norse. Line flushed with 10 mL normal saline and clamped. Patient tolerated procedure well.    PICC Removal    PICC length & location:  right brachial 35 cm Removed per verbal order from: Dr. Candiss Norse  Blood thinners:  none Platelet count:  no recent value available  Site assessment: Dressing clean and dry. Extremity warm and dry with palpable radial pulse. No redness, drainage, or swelling present at insertion site. Mild irritation of skin underneath dressing with small scabs, localized around suture site/PICC wings.   Pre-removal vital signs: obtained by CMA BP:  130/82 HR:  61 SpO2:  97%  room air  Patient unable to tolerate flat, supine position. Sutures present and removed. Insertion site cleaned with CHG, catheter removed and petroleum dressing applied. Tip intact. Pressure held until hemostasis achieved.    Length of catheter removed:  35 cm   Provided patient with after care instructions and precautions print out (via [place]). Reviewed this information with patient and sent back with patient to his facility for nursing staff.   Patient verbalized understanding and agreement, all questions answered. Patient tolerated procedure well and remained in clinic under the care of RN 30 minutes post removal.  Post-observation vital signs:  BP:  144/75 HR:  55 SpO2:  97% room air  Notified [place] team of removal.  Beryle Flock, RN

## 2022-08-25 LAB — COMPLETE METABOLIC PANEL WITH GFR
AG Ratio: 0.9 (calc) — ABNORMAL LOW (ref 1.0–2.5)
ALT: 21 U/L (ref 9–46)
AST: 21 U/L (ref 10–35)
Albumin: 2.8 g/dL — ABNORMAL LOW (ref 3.6–5.1)
Alkaline phosphatase (APISO): 63 U/L (ref 35–144)
BUN: 20 mg/dL (ref 7–25)
CO2: 32 mmol/L (ref 20–32)
Calcium: 9.7 mg/dL (ref 8.6–10.3)
Chloride: 98 mmol/L (ref 98–110)
Creat: 0.91 mg/dL (ref 0.70–1.22)
Globulin: 3.1 g/dL (calc) (ref 1.9–3.7)
Glucose, Bld: 130 mg/dL — ABNORMAL HIGH (ref 65–99)
Potassium: 5.3 mmol/L (ref 3.5–5.3)
Sodium: 138 mmol/L (ref 135–146)
Total Bilirubin: 0.4 mg/dL (ref 0.2–1.2)
Total Protein: 5.9 g/dL — ABNORMAL LOW (ref 6.1–8.1)
eGFR: 83 mL/min/{1.73_m2} (ref >=60)

## 2022-08-25 LAB — CBC WITH DIFFERENTIAL/PLATELET
Absolute Monocytes: 656 cells/uL (ref 200–950)
Basophils Absolute: 79 cells/uL (ref 0–200)
Basophils Relative: 1 %
Eosinophils Absolute: 300 cells/uL (ref 15–500)
Eosinophils Relative: 3.8 %
HCT: 32.8 % — ABNORMAL LOW (ref 38.5–50.0)
Hemoglobin: 11 g/dL — ABNORMAL LOW (ref 13.2–17.1)
Lymphs Abs: 2552 cells/uL (ref 850–3900)
MCH: 32.8 pg (ref 27.0–33.0)
MCHC: 33.5 g/dL (ref 32.0–36.0)
MCV: 97.9 fL (ref 80.0–100.0)
MPV: 9.2 fL (ref 7.5–12.5)
Monocytes Relative: 8.3 %
Neutro Abs: 4313 cells/uL (ref 1500–7800)
Neutrophils Relative %: 54.6 %
Platelets: 326 10*3/uL (ref 140–400)
RBC: 3.35 10*6/uL — ABNORMAL LOW (ref 4.20–5.80)
RDW: 12.3 % (ref 11.0–15.0)
Total Lymphocyte: 32.3 %
WBC: 7.9 10*3/uL (ref 3.8–10.8)

## 2022-08-25 LAB — SEDIMENTATION RATE: Sed Rate: 86 mm/h — ABNORMAL HIGH (ref 0–20)

## 2022-08-25 LAB — C-REACTIVE PROTEIN: CRP: 45.8 mg/L — ABNORMAL HIGH (ref <8.0)

## 2022-08-27 DIAGNOSIS — M25552 Pain in left hip: Secondary | ICD-10-CM | POA: Diagnosis not present

## 2022-08-27 DIAGNOSIS — M533 Sacrococcygeal disorders, not elsewhere classified: Secondary | ICD-10-CM | POA: Diagnosis not present

## 2022-09-01 ENCOUNTER — Emergency Department (HOSPITAL_COMMUNITY): Payer: Medicare Other

## 2022-09-01 ENCOUNTER — Inpatient Hospital Stay (HOSPITAL_COMMUNITY)
Admission: EM | Admit: 2022-09-01 | Discharge: 2022-09-13 | DRG: 951 | Disposition: E | Payer: Medicare Other | Source: Skilled Nursing Facility | Attending: Obstetrics and Gynecology | Admitting: Obstetrics and Gynecology

## 2022-09-01 ENCOUNTER — Other Ambulatory Visit: Payer: Self-pay

## 2022-09-01 DIAGNOSIS — Z1152 Encounter for screening for COVID-19: Secondary | ICD-10-CM | POA: Diagnosis not present

## 2022-09-01 DIAGNOSIS — Z85828 Personal history of other malignant neoplasm of skin: Secondary | ICD-10-CM | POA: Diagnosis not present

## 2022-09-01 DIAGNOSIS — Z515 Encounter for palliative care: Secondary | ICD-10-CM | POA: Diagnosis not present

## 2022-09-01 DIAGNOSIS — Z792 Long term (current) use of antibiotics: Secondary | ICD-10-CM | POA: Diagnosis not present

## 2022-09-01 DIAGNOSIS — Z66 Do not resuscitate: Secondary | ICD-10-CM | POA: Diagnosis present

## 2022-09-01 DIAGNOSIS — M1990 Unspecified osteoarthritis, unspecified site: Secondary | ICD-10-CM | POA: Diagnosis present

## 2022-09-01 DIAGNOSIS — T8149XA Infection following a procedure, other surgical site, initial encounter: Secondary | ICD-10-CM | POA: Diagnosis present

## 2022-09-01 DIAGNOSIS — R1310 Dysphagia, unspecified: Secondary | ICD-10-CM | POA: Diagnosis present

## 2022-09-01 DIAGNOSIS — Z9981 Dependence on supplemental oxygen: Secondary | ICD-10-CM

## 2022-09-01 DIAGNOSIS — J189 Pneumonia, unspecified organism: Secondary | ICD-10-CM | POA: Diagnosis not present

## 2022-09-01 DIAGNOSIS — I700 Atherosclerosis of aorta: Secondary | ICD-10-CM | POA: Diagnosis present

## 2022-09-01 DIAGNOSIS — N179 Acute kidney failure, unspecified: Secondary | ICD-10-CM | POA: Diagnosis present

## 2022-09-01 DIAGNOSIS — Z87891 Personal history of nicotine dependence: Secondary | ICD-10-CM

## 2022-09-01 DIAGNOSIS — J690 Pneumonitis due to inhalation of food and vomit: Secondary | ICD-10-CM | POA: Diagnosis present

## 2022-09-01 DIAGNOSIS — I129 Hypertensive chronic kidney disease with stage 1 through stage 4 chronic kidney disease, or unspecified chronic kidney disease: Secondary | ICD-10-CM | POA: Diagnosis present

## 2022-09-01 DIAGNOSIS — N1831 Chronic kidney disease, stage 3a: Secondary | ICD-10-CM | POA: Diagnosis present

## 2022-09-01 DIAGNOSIS — T8452XD Infection and inflammatory reaction due to internal left hip prosthesis, subsequent encounter: Secondary | ICD-10-CM

## 2022-09-01 DIAGNOSIS — Y831 Surgical operation with implant of artificial internal device as the cause of abnormal reaction of the patient, or of later complication, without mention of misadventure at the time of the procedure: Secondary | ICD-10-CM | POA: Diagnosis present

## 2022-09-01 DIAGNOSIS — D631 Anemia in chronic kidney disease: Secondary | ICD-10-CM | POA: Diagnosis present

## 2022-09-01 DIAGNOSIS — N400 Enlarged prostate without lower urinary tract symptoms: Secondary | ICD-10-CM | POA: Diagnosis present

## 2022-09-01 DIAGNOSIS — E785 Hyperlipidemia, unspecified: Secondary | ICD-10-CM | POA: Diagnosis present

## 2022-09-01 DIAGNOSIS — F0390 Unspecified dementia without behavioral disturbance: Secondary | ICD-10-CM | POA: Diagnosis present

## 2022-09-01 DIAGNOSIS — Z96642 Presence of left artificial hip joint: Secondary | ICD-10-CM | POA: Diagnosis present

## 2022-09-01 DIAGNOSIS — I959 Hypotension, unspecified: Secondary | ICD-10-CM | POA: Diagnosis present

## 2022-09-01 DIAGNOSIS — Z79899 Other long term (current) drug therapy: Secondary | ICD-10-CM | POA: Diagnosis not present

## 2022-09-01 DIAGNOSIS — R0902 Hypoxemia: Secondary | ICD-10-CM | POA: Diagnosis present

## 2022-09-01 DIAGNOSIS — Z8701 Personal history of pneumonia (recurrent): Secondary | ICD-10-CM | POA: Diagnosis not present

## 2022-09-01 DIAGNOSIS — Z8616 Personal history of COVID-19: Secondary | ICD-10-CM | POA: Diagnosis not present

## 2022-09-01 DIAGNOSIS — D649 Anemia, unspecified: Principal | ICD-10-CM

## 2022-09-01 LAB — CBC WITH DIFFERENTIAL/PLATELET
Abs Immature Granulocytes: 0.08 10*3/uL — ABNORMAL HIGH (ref 0.00–0.07)
Basophils Absolute: 0 10*3/uL (ref 0.0–0.1)
Basophils Relative: 0 %
Eosinophils Absolute: 0.1 10*3/uL (ref 0.0–0.5)
Eosinophils Relative: 1 %
HCT: 13.7 % — ABNORMAL LOW (ref 39.0–52.0)
Hemoglobin: 4.1 g/dL — CL (ref 13.0–17.0)
Immature Granulocytes: 1 %
Lymphocytes Relative: 14 %
Lymphs Abs: 1.9 10*3/uL (ref 0.7–4.0)
MCH: 32.5 pg (ref 26.0–34.0)
MCHC: 29.9 g/dL — ABNORMAL LOW (ref 30.0–36.0)
MCV: 108.7 fL — ABNORMAL HIGH (ref 80.0–100.0)
Monocytes Absolute: 1.4 10*3/uL — ABNORMAL HIGH (ref 0.1–1.0)
Monocytes Relative: 10 %
Neutro Abs: 10.4 10*3/uL — ABNORMAL HIGH (ref 1.7–7.7)
Neutrophils Relative %: 74 %
Platelets: 297 10*3/uL (ref 150–400)
RBC: 1.26 MIL/uL — ABNORMAL LOW (ref 4.22–5.81)
RDW: 13.5 % (ref 11.5–15.5)
WBC: 13.9 10*3/uL — ABNORMAL HIGH (ref 4.0–10.5)
nRBC: 0 % (ref 0.0–0.2)

## 2022-09-01 LAB — COMPREHENSIVE METABOLIC PANEL
ALT: 13 U/L (ref 0–44)
AST: 17 U/L (ref 15–41)
Albumin: 1.5 g/dL — ABNORMAL LOW (ref 3.5–5.0)
Alkaline Phosphatase: 37 U/L — ABNORMAL LOW (ref 38–126)
Anion gap: 6 (ref 5–15)
BUN: 32 mg/dL — ABNORMAL HIGH (ref 8–23)
CO2: 22 mmol/L (ref 22–32)
Calcium: 7.4 mg/dL — ABNORMAL LOW (ref 8.9–10.3)
Chloride: 115 mmol/L — ABNORMAL HIGH (ref 98–111)
Creatinine, Ser: 1.26 mg/dL — ABNORMAL HIGH (ref 0.61–1.24)
GFR, Estimated: 56 mL/min — ABNORMAL LOW (ref >60)
Glucose, Bld: 83 mg/dL (ref 70–99)
Potassium: 4.1 mmol/L (ref 3.5–5.1)
Sodium: 143 mmol/L (ref 135–145)
Total Bilirubin: 0.3 mg/dL (ref 0.3–1.2)
Total Protein: 4.5 g/dL — ABNORMAL LOW (ref 6.5–8.1)

## 2022-09-01 LAB — HEMOGLOBIN AND HEMATOCRIT, BLOOD
HCT: 33 % — ABNORMAL LOW (ref 39.0–52.0)
Hemoglobin: 10.3 g/dL — ABNORMAL LOW (ref 13.0–17.0)

## 2022-09-01 LAB — POC OCCULT BLOOD, ED: Fecal Occult Bld: NEGATIVE

## 2022-09-01 LAB — PREPARE RBC (CROSSMATCH)

## 2022-09-01 LAB — PROTIME-INR
INR: 1.2 (ref 0.8–1.2)
Prothrombin Time: 14.9 seconds (ref 11.4–15.2)

## 2022-09-01 LAB — LACTIC ACID, PLASMA
Lactic Acid, Venous: 1.4 mmol/L (ref 0.5–1.9)
Lactic Acid, Venous: 1.4 mmol/L (ref 0.5–1.9)

## 2022-09-01 LAB — APTT: aPTT: 29 seconds (ref 24–36)

## 2022-09-01 MED ORDER — GLYCOPYRROLATE 0.2 MG/ML IJ SOLN
0.2000 mg | INTRAMUSCULAR | Status: DC | PRN
  Filled 2022-09-01 (×2): qty 1

## 2022-09-01 MED ORDER — MORPHINE 100MG IN NS 100ML (1MG/ML) PREMIX INFUSION
5.0000 mg/h | INTRAVENOUS | Status: DC
  Administered 2022-09-01 – 2022-09-06 (×7): 5 mg/h via INTRAVENOUS
  Filled 2022-09-01 (×7): qty 100

## 2022-09-01 MED ORDER — GLYCOPYRROLATE 0.2 MG/ML IJ SOLN
0.2000 mg | INTRAMUSCULAR | Status: DC | PRN
  Administered 2022-09-03 – 2022-09-06 (×5): 0.2 mg via INTRAVENOUS
  Filled 2022-09-01 (×4): qty 1

## 2022-09-01 MED ORDER — SODIUM CHLORIDE 0.9 % IV SOLN
1.0000 g | Freq: Once | INTRAVENOUS | Status: AC
  Administered 2022-09-01: 1 g via INTRAVENOUS
  Filled 2022-09-01: qty 10

## 2022-09-01 MED ORDER — POLYVINYL ALCOHOL 1.4 % OP SOLN: 1.0000 [drp] | Freq: Four times a day (QID) | OPHTHALMIC | Status: DC | PRN

## 2022-09-01 MED ORDER — SODIUM CHLORIDE 0.9% IV SOLUTION: Freq: Once | INTRAVENOUS | Status: DC

## 2022-09-01 MED ORDER — HALOPERIDOL LACTATE 5 MG/ML IJ SOLN
0.5000 mg | INTRAMUSCULAR | Status: DC | PRN
  Administered 2022-09-01: 0.5 mg via INTRAVENOUS
  Filled 2022-09-01: qty 1

## 2022-09-01 MED ORDER — ACETAMINOPHEN 650 MG RE SUPP
650.0000 mg | Freq: Four times a day (QID) | RECTAL | Status: DC | PRN
  Filled 2022-09-01: qty 1

## 2022-09-01 MED ORDER — LORAZEPAM 2 MG/ML PO CONC: 1.0000 mg | ORAL | Status: DC | PRN

## 2022-09-01 MED ORDER — SODIUM CHLORIDE 0.9 % IV SOLN
500.0000 mg | Freq: Once | INTRAVENOUS | Status: AC
  Administered 2022-09-01: 500 mg via INTRAVENOUS
  Filled 2022-09-01: qty 5

## 2022-09-01 MED ORDER — BIOTENE DRY MOUTH MT LIQD: 15.0000 mL | OROMUCOSAL | Status: DC | PRN

## 2022-09-01 MED ORDER — HALOPERIDOL 0.5 MG PO TABS: 0.5000 mg | ORAL_TABLET | ORAL | Status: DC | PRN

## 2022-09-01 MED ORDER — ONDANSETRON HCL 4 MG/2ML IJ SOLN: 4.0000 mg | Freq: Four times a day (QID) | INTRAMUSCULAR | Status: DC | PRN

## 2022-09-01 MED ORDER — MORPHINE BOLUS VIA INFUSION: 2.0000 mg | INTRAVENOUS | Status: DC | PRN

## 2022-09-01 MED ORDER — SODIUM CHLORIDE 0.9 % IV BOLUS
500.0000 mL | Freq: Once | INTRAVENOUS | Status: AC
  Administered 2022-09-01: 500 mL via INTRAVENOUS

## 2022-09-01 MED ORDER — HALOPERIDOL LACTATE 2 MG/ML PO CONC: 0.5000 mg | ORAL | Status: DC | PRN

## 2022-09-01 MED ORDER — LORAZEPAM 2 MG/ML IJ SOLN: 1.0000 mg | INTRAMUSCULAR | Status: DC | PRN

## 2022-09-01 MED ORDER — DIPHENHYDRAMINE HCL 50 MG/ML IJ SOLN: 12.5000 mg | INTRAMUSCULAR | Status: DC | PRN

## 2022-09-01 MED ORDER — ONDANSETRON 4 MG PO TBDP: 4.0000 mg | ORAL_TABLET | Freq: Four times a day (QID) | ORAL | Status: DC | PRN

## 2022-09-01 MED ORDER — ACETAMINOPHEN 325 MG PO TABS: 650.0000 mg | ORAL_TABLET | Freq: Four times a day (QID) | ORAL | Status: DC | PRN

## 2022-09-01 MED ORDER — LORAZEPAM 1 MG PO TABS: 1.0000 mg | ORAL_TABLET | ORAL | Status: DC | PRN

## 2022-09-01 MED ORDER — GLYCOPYRROLATE 1 MG PO TABS: 1.0000 mg | ORAL_TABLET | ORAL | Status: DC | PRN

## 2022-09-01 NOTE — ED Notes (Signed)
Pt resting with eyes closed, NAD noted at this time.

## 2022-09-01 NOTE — H&P (Signed)
History and Physical    Patient: Mark Howe SWH:[id number] DOB: Oct 28, 1934 DOA: 08/23/2022 DOS: the patient was seen and examined on 08/14/2022 PCP: [name], MD  Patient coming from: SNF - [facility]; NOK: [name], [phone]   Chief Complaint: Lethargy  HPI: Mark Howe is a 87 y.o. male with medical history significant of HTN, HLD, BPH, dementia, stage 3 CKD, COVID infection around 11/8, and recent admission for prosthetic joint infection with COVID PNA (hospitalized 11/10-16) presenting with lethargy, hypotension.  [facility] called his sister this AM, having trouble breathing, gurgling, and O2 was as low as 75%.  His sister reports that he has signs of dementia and lately it has gotten worse.  He had COVID in early November; since then he has been very confused.  Last Friday, she took him to an ortho appt - he was talking but was confused, sometimes comes and goes.  She was there yesterday and he was barely able to talk.  He has always been stubborn, argumentative, and headstrong.  He has been there since March - he lived alone and hadn't been able to get in touch with him, 911 found him in the floor.  At times, he has had trouble with dysphagia.  He hasn't been eating/drinking well the last week or so.  His sister thinks under the circumstances, he would want to die peacefully; she talked to him last week and he said this was the case.  She thinks that he would want to be comfortable and would not want treatment, has had terrible hip pain recently.  He cannot have another hip replacement, the only option would be to remove the hardware.  She thinks he has been hurting all the time and would be open to a morphine drip.  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  He is on prolonged antibiotic therapy.    ER Course:  Recent PNA.  Here today with lethargy, hypotension - improved with  IVF.  O2 down?  Ok here.  Patient denies symptoms, has dementia.  Hgb 4.1.  Ordered blood transfusion.  Creatinine up a little, doesn't appear diluted.  Heme negative.  CXR with ?PNA.  Given Rocephin/Azithromycin.     Review of Systems: unable to review all systems due to the inability of the patient to answer questions. Past Medical History:  Diagnosis Date   Arthritis    L hip & R knee- OA   Cancer ([place])    basal cell CA- face   Shortness of breath    Past Surgical History:  Procedure Laterality Date   APPENDECTOMY     [facility]- 2011   EYE SURGERY     cataracts removed, laser (bilateral)   NASAL SINUS SURGERY     [facility]- 1980's    TOTAL HIP ARTHROPLASTY  11/24/2011   Procedure: TOTAL HIP ARTHROPLASTY;  Surgeon: Ninetta Lights, MD;  Location: [facility];  Service: Orthopedics;  Laterality: Left;   variose     varicose vein surgery   Social History:  reports that he quit smoking about 13 years ago. His smoking use included cigarettes. He has never used smokeless tobacco. He reports that he does not drink alcohol and does not use drugs.  No Known Allergies  Family History  Problem Relation Age of Onset   Anesthesia problems Neg Hx     Prior to Admission medications   Medication Sig Start Date  End Date Taking? Authorizing Provider  acetaminophen (TYLENOL) 325 MG tablet Take 650 mg by mouth every 6 (six) hours as needed (pain).    [provider]  Amino Acids-Protein Hydrolys (PRO-STAT PO) Take 30 mLs by mouth 2 (two) times daily.    [provider]  cefadroxil (DURICEF) 500 MG capsule Take 2 capsules (1,000 mg total) by mouth 2 (two) times daily for 21 days. 08/24/22 09/14/22  Laurice Record, MD  citalopram (CELEXA) 20 MG tablet Take 1 tablet (20 mg total) by mouth daily. 07/27/22   Nita Sells, MD  divalproex (DEPAKOTE SPRINKLE) 125 MG capsule Take 2 capsules (250 mg total) by mouth 3 (three) times daily. 07/27/22   Nita Sells, MD  doxycycline  (VIBRA-TABS) 100 MG tablet Take 1 tablet (100 mg total) by mouth 2 (two) times daily with a meal for 21 days. 08/24/22 09/14/22  Laurice Record, MD  memantine (NAMENDA) 10 MG tablet Take 10 mg by mouth at bedtime.    [provider]  tamsulosin (FLOMAX) 0.4 MG CAPS capsule Take 0.4 mg by mouth at bedtime.    [provider]  white petrolatum (VASELINE) OINT Apply 1 application. topically as needed for dry skin. Patient taking differently: Apply 1 application  topically daily as needed for dry skin. 11/17/21   Jonetta Osgood, MD    Physical Exam: Vitals:   08/25/2022 1400 08/23/2022 1445 09/02/2022 1530 08/19/2022 1615  BP: (!) 111/52 122/71 (!) 112/54 (!) 92/50  Pulse: (!) 57 62 62 61  Resp: '20 16 18 18  '$ Temp: 97.7 F (36.5 C)     TempSrc:      SpO2: 94% (!) 87% 93% 90%  Weight:      Height:       General:  Appears chronically ill, confused, mostly nonverbal currently Eyes:   EOMI, normal lids, iris ENT:  grossly normal lips & tongue, mmm Neck:  no LAD, masses or thyromegaly Cardiovascular:  RRR, no m/r/g. No LE edema.  Respiratory:   RLL rhonchi.  Normal respiratory effort. Abdomen:  soft, NT, ND Back:   normal alignment, no CVAT Skin:  no rash or induration seen on limited exam Musculoskeletal:  no bony abnormality Psychiatric:  confused mood and affect, speech not present currently Neurologic:  unable to perform   Radiological Exams on Admission: Independently reviewed - see discussion in A/P where applicable  DG Chest Port 1 View  Result Date: 08/26/2022 CLINICAL DATA:  Infection EXAM: PORTABLE CHEST 1 VIEW COMPARISON:  07/23/2022 FINDINGS: Rotated study with diffuse right hemithorax opacity consistent with pneumonia versus edema. Left perihilar linear subsegmental atelectasis or scarring. Normal pulmonary vasculature. No pleural effusion on the left. There may be small pleural effusion on the right. No pneumothorax identified. IMPRESSION: Alveolar process on the  right consistent with pneumonia or edema. Possible small right-sided pleural effusion. Electronically Signed   By: Sammie Bench M.D.   On: 08/23/2022 11:22    EKG: Independently reviewed.  NSR with rate 65; LBBB with NSCSLT   Labs on Admission: I have personally reviewed the available labs and imaging studies at the time of the admission.  Pertinent labs:    Glucose 115  BUN 32/Creatinine 1.26/GFR 56 Albumin 1.5 Lactate 1.4, 1.4 WBC 13.9 Hgb 4.1 - lab error, repeat Hgb 10.3 Heme negative Blood cultures pending   Assessment and Plan: Principal Problem:   HCAP (healthcare-associated pneumonia) Active Problems:   Left hip postoperative wound infection   Dementia without behavioral disturbance ([place])  DNR (do not resuscitate)    End of life care -Patient presenting with RLL PNA, hypotension -After long discussion by telephone, family has decided to proceed with comfort care only -He has been dwindling, likely has chronic aspiration associated with dementia as well as disabling hip condition -He will be admitted to [facility] for comfort care and palliative care consult -Patient is likely to be a candidate for [facility] or other residential hospice if he is not actively dying at this time -Comfort care order set utilized -No antibiotics or IVF after discussion -Pain control with morphine drip -Will hold home medications at this time     Advance Care Planning:   Code Status: DNR   Consults: Palliative care  DVT Prophylaxis: None, comfort only  Family Communication: None present; I spoke with his sister by telephone at the time of admission  Severity of Illness: The appropriate patient status for this patient is INPATIENT. Inpatient status is judged to be reasonable and necessary in order to provide the required intensity of service to ensure the patient's safety. The patient's presenting symptoms, physical exam findings, and initial radiographic and laboratory data in the  context of their chronic comorbidities is felt to place them at high risk for further clinical deterioration. Furthermore, it is not anticipated that the patient will be medically stable for discharge from the hospital within 2 midnights of admission.   * I certify that at the point of admission it is my clinical judgment that the patient will require inpatient hospital care spanning beyond 2 midnights from the point of admission due to high intensity of service, high risk for further deterioration and high frequency of surveillance required.*  Author: Karmen Bongo, MD 08/25/2022 4:37 PM  For on call review [email or web address].

## 2022-09-01 NOTE — ED Provider Notes (Signed)
[facility] EMERGENCY DEPARTMENT Provider Note   CSN: [id number] Arrival date & time: 08/26/2022  1043     History  Chief Complaint  Patient presents with   Fatigue   Hypotension    Mark Howe is a 87 y.o. male.  HPI   Patient has a history of dementia, cancer, arthritis, who presented to ED from [facility] skilled nursing facility.  Patient was diagnosed with pneumonia few weeks ago.  Patient was sent to the ED because he was noted to be lethargic hypotensive.  EMS also noted the patient was hypoxic on 2 L of nasal cannula.  His oxygen supplementation was increased to 4 L.  Patient denies any specific complaints to me.  He denies having any pain other than when he gets moved around.  He is not aware of how much he has been coughing.  He does not feel short of breath at this time Home Medications Prior to Admission medications   Medication Sig Start Date End Date Taking? Authorizing Provider  acetaminophen (TYLENOL) 325 MG tablet Take 650 mg by mouth every 6 (six) hours as needed (pain).    [provider]  Amino Acids-Protein Hydrolys (PRO-STAT PO) Take 30 mLs by mouth 2 (two) times daily.    [provider]  cefadroxil (DURICEF) 500 MG capsule Take 2 capsules (1,000 mg total) by mouth 2 (two) times daily for 21 days. 08/24/22 09/14/22  Laurice Record, MD  citalopram (CELEXA) 20 MG tablet Take 1 tablet (20 mg total) by mouth daily. 07/27/22   Nita Sells, MD  divalproex (DEPAKOTE SPRINKLE) 125 MG capsule Take 2 capsules (250 mg total) by mouth 3 (three) times daily. 07/27/22   Nita Sells, MD  doxycycline (VIBRA-TABS) 100 MG tablet Take 1 tablet (100 mg total) by mouth 2 (two) times daily with a meal for 21 days. 08/24/22 09/14/22  Laurice Record, MD  memantine (NAMENDA) 10 MG tablet Take 10 mg by mouth at bedtime.    [provider]  tamsulosin (FLOMAX) 0.4 MG CAPS capsule Take 0.4 mg by mouth at bedtime.    [provider]  white petrolatum (VASELINE) OINT Apply 1 application. topically as needed for dry skin. Patient taking differently: Apply 1 application  topically daily as needed for dry skin. 11/17/21   Ghimire, Henreitta Leber, MD      Allergies    Patient has no known allergies.    Review of Systems   Review of Systems  Physical Exam Updated Vital Signs BP (!) 111/52   Pulse (!) 57   Temp 97.7 F (36.5 C)   Resp 20   Ht 1.727 m ('5\' 8"'$ )   Wt 93.4 kg   SpO2 (!) 88%   BMI 31.32 kg/m  Physical Exam Vitals and nursing note reviewed.  Constitutional:      Appearance: He is well-developed.     Comments: Elderly, frail  HENT:     Head: Normocephalic and atraumatic.     Right Ear: External ear normal.     Left Ear: External ear normal.  Eyes:     General: No scleral icterus.       Right eye: No discharge.        Left eye: No discharge.     Conjunctiva/sclera: Conjunctivae normal.  Neck:     Trachea: No tracheal deviation.  Cardiovascular:     Rate and Rhythm: Normal rate and regular rhythm.  Pulmonary:     Effort: Pulmonary effort is normal. No  respiratory distress.     Breath sounds: Normal breath sounds. No stridor. No wheezing or rales.     Comments: Frequent cough Abdominal:     General: Bowel sounds are normal. There is no distension.     Palpations: Abdomen is soft.     Tenderness: There is no abdominal tenderness. There is no guarding or rebound.  Musculoskeletal:        General: No tenderness or deformity.     Cervical back: Neck supple.  Skin:    General: Skin is warm and dry.     Findings: No rash.  Neurological:     General: No focal deficit present.     Mental Status: He is alert.     Cranial Nerves: No cranial nerve deficit, dysarthria or facial asymmetry.     Sensory: No sensory deficit.     Motor: No abnormal muscle tone or seizure activity.     Coordination: Coordination normal.  Psychiatric:        Mood and Affect: Mood normal.     ED Results /  Procedures / Treatments   Labs (all labs ordered are listed, but only abnormal results are displayed) Labs Reviewed  COMPREHENSIVE METABOLIC PANEL - Abnormal; Notable for the following components:      Result Value   Chloride 115 (*)    BUN 32 (*)    Creatinine, Ser 1.26 (*)    Calcium 7.4 (*)    Total Protein 4.5 (*)    Albumin 1.5 (*)    Alkaline Phosphatase 37 (*)    GFR, Estimated 56 (*)    All other components within normal limits  CBC WITH DIFFERENTIAL/PLATELET - Abnormal; Notable for the following components:   WBC 13.9 (*)    RBC 1.26 (*)    Hemoglobin 4.1 (*)    HCT 13.7 (*)    MCV 108.7 (*)    MCHC 29.9 (*)    Neutro Abs 10.4 (*)    Monocytes Absolute 1.4 (*)    Abs Immature Granulocytes 0.08 (*)    All other components within normal limits  HEMOGLOBIN AND HEMATOCRIT, BLOOD - Abnormal; Notable for the following components:   Hemoglobin 10.3 (*)    HCT 33.0 (*)    All other components within normal limits  CULTURE, BLOOD (ROUTINE X 2)  CULTURE, BLOOD (ROUTINE X 2)  URINE CULTURE  RESP PANEL BY RT-PCR (RSV, FLU A&B, COVID)  RVPGX2  LACTIC ACID, PLASMA  LACTIC ACID, PLASMA  PROTIME-INR  APTT  URINALYSIS, ROUTINE W REFLEX MICROSCOPIC  CBC  POC OCCULT BLOOD, ED  TYPE AND SCREEN  PREPARE RBC (CROSSMATCH)    EKG EKG Interpretation  Date/Time:  Wednesday September 01 2022 11:06:23 EST Ventricular Rate:  65 PR Interval:  144 QRS Duration: 149 QT Interval:  459 QTC Calculation: 478 R Axis:   35 Text Interpretation: Sinus rhythm Atrial premature complex Left bundle branch block No significant change since last tracing Confirmed by Dorie Rank [phone]) on 09/02/2022 11:08:45 AM  Radiology DG Chest Port 1 View  Result Date: 09/12/2022 CLINICAL DATA:  Infection EXAM: PORTABLE CHEST 1 VIEW COMPARISON:  07/23/2022 FINDINGS: Rotated study with diffuse right hemithorax opacity consistent with pneumonia versus edema. Left perihilar linear subsegmental atelectasis or  scarring. Normal pulmonary vasculature. No pleural effusion on the left. There may be small pleural effusion on the right. No pneumothorax identified. IMPRESSION: Alveolar process on the right consistent with pneumonia or edema. Possible small right-sided pleural effusion. Electronically Signed   By:  Sammie Bench M.D.   On: 08/29/2022 11:22    Procedures .Critical Care  Performed by: Dorie Rank, MD Authorized by: Dorie Rank, MD   Critical care provider statement:    Critical care time (minutes):  40   Critical care was time spent personally by me on the following activities:  Development of treatment plan with patient or surrogate, discussions with consultants, evaluation of patient's response to treatment, examination of patient, ordering and review of laboratory studies, ordering and review of radiographic studies, ordering and performing treatments and interventions, pulse oximetry, re-evaluation of patient's condition and review of old charts     Medications Ordered in ED Medications  0.9 %  sodium chloride infusion (Manually program via Guardrails IV Fluids) (0 mLs Intravenous Hold 09/02/2022 1322)  azithromycin (ZITHROMAX) 500 mg in sodium chloride 0.9 % 250 mL IVPB (500 mg Intravenous New Bag/Given 08/13/2022 1442)  sodium chloride 0.9 % bolus 500 mL (0 mLs Intravenous Stopped 09/10/2022 1204)  cefTRIAXone (ROCEPHIN) 1 g in sodium chloride 0.9 % 100 mL IVPB (0 g Intravenous Stopped 08/29/2022 1443)    ED Course/ Medical Decision Making/ A&P Clinical Course as of 08/27/2022 1444  Wed Sep 01, 2022  1244 CBC with Differential(!!) Hemoglobin decreased at 4.1.  Will proceed with blood transfusions.  Will order Hemoccult [JK]  1401 DG Chest Port 1 View X-ray suggest pneumonia [JK]  1401 Comprehensive metabolic panel(!) AKI noted on metabolic panel [JK]  7062 Case discussed with Dr. Lorin Mercy regarding admission [JK]    Clinical Course User Index [JK] Dorie Rank, MD                            Medical Decision Making Problems Addressed: AKI (acute kidney injury) [facility]): acute illness or injury that poses a threat to life or bodily functions Anemia, unspecified type: acute illness or injury that poses a threat to life or bodily functions  Amount and/or Complexity of Data Reviewed Labs: ordered. Decision-making details documented in ED Course. Radiology: ordered. Decision-making details documented in ED Course. ECG/medicine tests: ordered.  Risk Prescription drug management. Decision regarding hospitalization.   Patient presented to the ED for evaluation of lethargy hypotension.    Consider the possibility of evolving sepsis but lactic acid level is normal.  No fever although he does have leukocytosis.  Chest x-ray does show the possibility of pneumonia.  Patient has been given IV antibiotics.  Labs are also notable for significant drop in hemoglobin.  Patient has a significant drop in hemoglobin.  Fecal occult however is negative.  Unclear the source of blood loss.  IV blood transfusion has been ordered.  With his borderline blood pressures pneumonia and anemia patient will be admitted to the hospital for further treatment and evaluation.        Final Clinical Impression(s) / ED Diagnoses Final diagnoses:  Anemia, unspecified type  AKI (acute kidney injury) [facility])    Rx / DC Orders ED Discharge Orders     None         Dorie Rank, MD 08/25/2022 1444

## 2022-09-01 NOTE — ED Triage Notes (Signed)
From [facility] SNF. Dx with pneumonia a few weeks ago. Finished 2 abx. Here for lethargy, hypotension with 80/50 initial bp. EMS gave 500cc bolus with current bp at 97/50. Pt was also hypoxic at 75% on 2LPM. Currently at 4LPM with 100%. A&O x 4. Hx of dementia.

## 2022-09-02 DIAGNOSIS — J189 Pneumonia, unspecified organism: Secondary | ICD-10-CM

## 2022-09-02 LAB — RESP PANEL BY RT-PCR (RSV, FLU A&B, COVID)  RVPGX2
Influenza A by PCR: NEGATIVE
Influenza B by PCR: NEGATIVE
Resp Syncytial Virus by PCR: NEGATIVE
SARS Coronavirus 2 by RT PCR: NEGATIVE

## 2022-09-02 NOTE — Progress Notes (Signed)
   Mark Howe  GGY:[id number] DOB: September 04, 1935 DOA: 08/30/2022 PCP: [name], MD    Brief Narrative:  87 year old SNF resident with a history of HTN, HLD, BPH, dementia, CKD stage III, and admission to the hospital November 2023 with prosthetic joint infection and COVID-pneumonia who returned to the ER with lethargy and hypotension.  Staff at his SNF reported that he was found with difficulty breathing, gurgling, with oxygen saturations as low as 75%.  The family had noted worsening of his dementia in the preceding month.  In the ED he was found to have a hemoglobin of 4.1, though follow-up labs suggest this was likely an error with a hemoglobin of 10 more consistent with his baseline.  There is no obvious source of blood loss.  Chest x-ray suggested an infiltrate.  Consultants:  None  Goals of Care:  Code Status: DNR   DVT prophylaxis: None  Interim Hx: Afebrile.  Remains hypotensive with systolics in the 03J.  Resting comfortably in bed at the time of visit.  Assessment & Plan:  Right lower lobe pneumonia -likely due to aspiration  Acute anemia of unclear etiology  Advanced dementia  Left hip prosthetic joint infection  Comfort focused care At the time of his admission discussion was held with the family and all parties agreed that the most appropriate treatment plan for this patient would be to focus on comfort   Family Communication: Spoke with family at bedside Disposition: Monitor as inpatient for 24-48 hours to determine trajectory -consider [facility] transfer if proves clinically appropriate   Objective: Blood pressure (!) 78/43, pulse 66, temperature 98 F (36.7 C), temperature source Oral, resp. rate 10, height '5\' 8"'$  (1.727 m), weight 93.4 kg, SpO2 93 %.  Intake/Output Summary (Last 24 hours) at 09/02/2022 1030 Last data filed at 09/03/2022 1544 Gross per 24 hour  Intake 850 ml  Output --  Net 850 ml   Filed Weights   08/16/2022 1121  Weight:  93.4 kg    Examination: No evidence of respiratory distress.  Appears to be resting comfortably.  CBC: Recent Labs  Lab 08/29/2022 1103 08/15/2022 1314  WBC 13.9*  --   NEUTROABS 10.4*  --   HGB 4.1* 10.3*  HCT 13.7* 33.0*  MCV 108.7*  --   PLT 297  --    Basic Metabolic Panel: Recent Labs  Lab 08/27/2022 1103  NA 143  K 4.1  CL 115*  CO2 22  GLUCOSE 83  BUN 32*  CREATININE 1.26*  CALCIUM 7.4*   GFR: Estimated Creatinine Clearance: 47.5 mL/min (A) (by C-G formula based on SCr of 1.26 mg/dL (H)).   Scheduled Meds: Continuous Infusions:  morphine 5 mg/hr (08/29/2022 1544)     LOS: 1 day   Cherene Altes, MD [facility] Office  [phone] Pager - Text Page per Amion  If 7PM-7AM, please contact night-coverage per Amion 09/02/2022, 10:30 AM

## 2022-09-02 NOTE — Progress Notes (Signed)
Patient ID: Mark Howe, male   DOB: 1935-08-10, 87 y.o.   MRN: [id number]  Called sister, [name], and updated on patient condition.  Haydee Salter, RN

## 2022-09-03 DIAGNOSIS — Z515 Encounter for palliative care: Secondary | ICD-10-CM

## 2022-09-03 DIAGNOSIS — D649 Anemia, unspecified: Secondary | ICD-10-CM

## 2022-09-03 DIAGNOSIS — J189 Pneumonia, unspecified organism: Secondary | ICD-10-CM | POA: Diagnosis not present

## 2022-09-03 DIAGNOSIS — N179 Acute kidney failure, unspecified: Secondary | ICD-10-CM

## 2022-09-03 NOTE — Care Management Important Message (Signed)
Important Message  Patient Details  Name: Mark Howe MRN: [id number] Date of Birth: April 06, 1935   Medicare Important Message Given:  Other (see comment)     Hannah Beat 09/03/2022, 12:55 PM

## 2022-09-03 NOTE — Progress Notes (Signed)
12/22 IMM Letter respectfully not given, patient under Comfort Measures.

## 2022-09-03 NOTE — Progress Notes (Signed)
Brief Palliative Medicine Progress Note:  PMT consult received and chart reviewed. Patient admitted on 09/03/2022 for end of life care. Comfort medications for EOL have already been ordered.  Discussed case with Dr. Thereasa Solo - no PMT needs at this time. Patient may be in hospital death. If he remains stable for discharge, recommend TOC assistance with residential hospice placement.   Please re-consult if PMT can be of further assistance for end of life symptom management.   Thank you for allowing PMT to assist in the care of this patient.  Rosaland Shiffman M. Tamala Julian [facility] Palliative Medicine Team Team Phone: [phone] NO CHARGE

## 2022-09-03 NOTE — Progress Notes (Signed)
   Mark Howe  HRC:[id number] DOB: [birth year] DOA: 09/10/2022 PCP: [name], MD    Brief Narrative:  87 year old SNF resident with a history of HTN, HLD, BPH, dementia, CKD stage III, and admission to the hospital November 2023 with prosthetic joint infection and COVID-pneumonia who returned to the ER with lethargy and hypotension.  Staff at his SNF reported that he was found with difficulty breathing, gurgling, with oxygen saturations as low as 75%.  The family had noted worsening of his dementia in the preceding month.  In the ED he was found to have a hemoglobin of 4.1, though follow-up labs suggest this was likely an error with a hemoglobin of 10 more consistent with his baseline.  There is no obvious source of blood loss.  Chest x-ray suggested an infiltrate.  Consultants:  None  Goals of Care:  Code Status: DNR   DVT prophylaxis: None  Interim Hx: Appears to be resting comfortably at the time of my visit.  Spoke with family at the bedside.  No new needs at present.  Assessment & Plan:  Comfort focused care At the time of his admission discussion was held with the family and all parties agreed that the most appropriate treatment plan for this patient would be to focus on comfort  Right lower lobe pneumonia -likely due to aspiration  Acute anemia of unclear etiology  Advanced dementia  Left hip prosthetic joint infection   Family Communication: Spoke with family at bedside Disposition: Monitor as inpatient for another 24-48 hours to determine trajectory -consider [employer] transfer if proves clinically appropriate   Objective: Blood pressure (!) 83/48, pulse 73, temperature 98 F (36.7 C), temperature source Oral, resp. rate 10, height '5\' 8"'$  (1.727 m), weight 93.4 kg, SpO2 92 %.  Intake/Output Summary (Last 24 hours) at 09/03/2022 1046 Last data filed at 09/03/2022 0355 Gross per 24 hour  Intake 158.78 ml  Output --  Net 158.78 ml    Filed Weights    08/22/2022 1121  Weight: 93.4 kg    Examination: No evidence of respiratory distress.  Appears to be resting comfortably.  CBC: Recent Labs  Lab 08/14/2022 1103 09/02/2022 1314  WBC 13.9*  --   NEUTROABS 10.4*  --   HGB 4.1* 10.3*  HCT 13.7* 33.0*  MCV 108.7*  --   PLT 297  --     Basic Metabolic Panel: Recent Labs  Lab 09/04/2022 1103  NA 143  K 4.1  CL 115*  CO2 22  GLUCOSE 83  BUN 32*  CREATININE 1.26*  CALCIUM 7.4*    GFR: Estimated Creatinine Clearance: 47.5 mL/min (A) (by C-G formula based on SCr of 1.26 mg/dL (H)).   Scheduled Meds: Continuous Infusions:  morphine 5 mg/hr (09/03/22 6803)     LOS: 2 days   Cherene Altes, MD [facility] Office  [phone] Pager - Text Page per Amion  If 7PM-7AM, please contact night-coverage per Amion 09/03/2022, 10:46 AM

## 2022-09-04 DIAGNOSIS — J189 Pneumonia, unspecified organism: Secondary | ICD-10-CM | POA: Diagnosis not present

## 2022-09-04 NOTE — Progress Notes (Signed)
PROGRESS NOTE    Mark Howe  ASN:[id number] DOB: [birth year] DOA: 08/26/2022 PCP: [name], MD   Brief Narrative:  87 year old SNF resident with a history of HTN, HLD, BPH, dementia, CKD stage III, and admission to the hospital November 2023 with prosthetic joint infection and COVID-pneumonia who returned to the ER with lethargy and hypotension. Staff at his SNF reported that he was found with difficulty breathing, gurgling, with oxygen saturations as low as 75%. The family had noted worsening of his dementia in the preceding month. In the ED he was found to have a hemoglobin of 4.1, though follow-up labs suggest this was likely an error with a hemoglobin of 10 more consistent with his baseline. There is no obvious source of blood loss. Chest x-ray suggested an infiltrate.   Assessment & Plan:   End of life Care: -At the time of his admission discussion was held with the family and all parties agreed that the most appropriate treatment plan for this patient would be to focus on comfort -Palliative care consulted.  Placed on comfort measures.  Pain control with morphine drip   Right lower lobe pneumonia -likely due to aspiration -on RA, not in any distress   Anemia of chronic disease   Advanced dementia   Left hip prosthetic joint infection  DVT prophylaxis: None Code Status: DNR Family Communication:  None present at bedside.   Disposition Plan: Will monitor him in the hospital and will consider [facility] transfer if clinically appropriate.  Consultants:  Palliative care  Procedures:  None  Antimicrobials:  None  Status is: Inpatient   Subjective: Patient seen and examined.  Appears to be resting comfortably on the bed.  No family at bedside.  No acute events overnight.  Objective: Vitals:   09/02/22 1003 09/02/22 2345 09/03/22 1611 09/03/22 1934  BP: (!) 78/43 (!) 83/48 104/80 (!) 100/43  Pulse: 66 73 70 87  Resp: 10  14 (!) 9  Temp: 98 F (36.7  C)  (!) 101 F (38.3 C) 99.5 F (37.5 C)  TempSrc: Oral  Oral Oral  SpO2: 93% 92% 96% 96%  Weight:      Height:        Intake/Output Summary (Last 24 hours) at 09/04/2022 0947 Last data filed at 09/04/2022 1937 Gross per 24 hour  Intake 111.21 ml  Output --  Net 111.21 ml   Filed Weights   09/11/2022 1121  Weight: 93.4 kg    Examination:  General exam: Appears calm and comfortable, resting, not in any respiratory distress.  Data Reviewed: I have personally reviewed following labs and imaging studies  CBC: Recent Labs  Lab 09/11/2022 1103 08/29/2022 1314  WBC 13.9*  --   NEUTROABS 10.4*  --   HGB 4.1* 10.3*  HCT 13.7* 33.0*  MCV 108.7*  --   PLT 297  --    Basic Metabolic Panel: Recent Labs  Lab 08/20/2022 1103  NA 143  K 4.1  CL 115*  CO2 22  GLUCOSE 83  BUN 32*  CREATININE 1.26*  CALCIUM 7.4*   GFR: Estimated Creatinine Clearance: 47.5 mL/min (A) (by C-G formula based on SCr of 1.26 mg/dL (H)). Liver Function Tests: Recent Labs  Lab 08/19/2022 1103  AST 17  ALT 13  ALKPHOS 37*  BILITOT 0.3  PROT 4.5*  ALBUMIN 1.5*   No results for input(s): "LIPASE", "AMYLASE" in the last 168 hours. No results for input(s): "AMMONIA" in the last 168 hours. Coagulation Profile: Recent Labs  Lab 08/18/2022 1103  INR 1.2   Cardiac Enzymes: No results for input(s): "CKTOTAL", "CKMB", "CKMBINDEX", "TROPONINI" in the last 168 hours. BNP (last 3 results) No results for input(s): "PROBNP" in the last 8760 hours. HbA1C: No results for input(s): "HGBA1C" in the last 72 hours. CBG: No results for input(s): "GLUCAP" in the last 168 hours. Lipid Profile: No results for input(s): "CHOL", "HDL", "LDLCALC", "TRIG", "CHOLHDL", "LDLDIRECT" in the last 72 hours. Thyroid Function Tests: No results for input(s): "TSH", "T4TOTAL", "FREET4", "T3FREE", "THYROIDAB" in the last 72 hours. Anemia Panel: No results for input(s): "VITAMINB12", "FOLATE", "FERRITIN", "TIBC", "IRON",  "RETICCTPCT" in the last 72 hours. Sepsis Labs: Recent Labs  Lab 08/18/2022 1103 08/19/2022 1314  LATICACIDVEN 1.4 1.4    Recent Results (from the past 240 hour(s))  Resp panel by RT-PCR (RSV, Flu A&B, Covid) Anterior Nasal Swab     Status: None   Collection Time: 09/04/2022 10:52 AM   Specimen: Anterior Nasal Swab  Result Value Ref Range Status   SARS Coronavirus 2 by RT PCR NEGATIVE NEGATIVE Final    Comment: (NOTE) SARS-CoV-2 target nucleic acids are NOT DETECTED.  The SARS-CoV-2 RNA is generally detectable in upper respiratory specimens during the acute phase of infection. The lowest concentration of SARS-CoV-2 viral copies this assay can detect is 138 copies/mL. A negative result does not preclude SARS-Cov-2 infection and should not be used as the sole basis for treatment or other patient management decisions. A negative result may occur with  improper specimen collection/handling, submission of specimen other than nasopharyngeal swab, presence of viral mutation(s) within the areas targeted by this assay, and inadequate number of viral copies(<138 copies/mL). A negative result must be combined with clinical observations, patient history, and epidemiological information. The expected result is Negative.  Fact Sheet for Patients:  EntrepreneurPulse.com.au  Fact Sheet for Healthcare Providers:  IncredibleEmployment.be  This test is no t yet approved or cleared by the [place] FDA and  has been authorized for detection and/or diagnosis of SARS-CoV-2 by FDA under an Emergency Use Authorization (EUA). This EUA will remain  in effect (meaning this test can be used) for the duration of the COVID-19 declaration under Section 564(b)(1) of the Act, 21 U.S.C.section 360bbb-3(b)(1), unless the authorization is terminated  or revoked sooner.       Influenza A by PCR NEGATIVE NEGATIVE Final   Influenza B by PCR NEGATIVE NEGATIVE Final     Comment: (NOTE) The Xpert Xpress SARS-CoV-2/FLU/RSV plus assay is intended as an aid in the diagnosis of influenza from Nasopharyngeal swab specimens and should not be used as a sole basis for treatment. Nasal washings and aspirates are unacceptable for Xpert Xpress SARS-CoV-2/FLU/RSV testing.  Fact Sheet for Patients: EntrepreneurPulse.com.au  Fact Sheet for Healthcare Providers: IncredibleEmployment.be  This test is not yet approved or cleared by the [place] FDA and has been authorized for detection and/or diagnosis of SARS-CoV-2 by FDA under an Emergency Use Authorization (EUA). This EUA will remain in effect (meaning this test can be used) for the duration of the COVID-19 declaration under Section 564(b)(1) of the Act, 21 U.S.C. section 360bbb-3(b)(1), unless the authorization is terminated or revoked.     Resp Syncytial Virus by PCR NEGATIVE NEGATIVE Final    Comment: (NOTE) Fact Sheet for Patients: EntrepreneurPulse.com.au  Fact Sheet for Healthcare Providers: IncredibleEmployment.be  This test is not yet approved or cleared by the [place] FDA and has been authorized for detection and/or diagnosis of SARS-CoV-2 by FDA under an Emergency  Use Authorization (EUA). This EUA will remain in effect (meaning this test can be used) for the duration of the COVID-19 declaration under Section 564(b)(1) of the Act, 21 U.S.C. section 360bbb-3(b)(1), unless the authorization is terminated or revoked.  Performed at [facility], [place] [address] [place], [place] [address]   Blood Culture (routine x 2)     Status: None (Preliminary result)   Collection Time: 09/05/2022 10:56 AM   Specimen: BLOOD LEFT ARM  Result Value Ref Range Status   Specimen Description BLOOD LEFT ARM  Final   Special Requests   Final    BOTTLES DRAWN AEROBIC ONLY Blood Culture adequate volume   Culture   Final    NO GROWTH  3 DAYS Performed at [facility], [place] [address], [place], [place] [address]    Report Status PENDING  Incomplete  Blood Culture (routine x 2)     Status: None (Preliminary result)   Collection Time: 08/31/2022 11:23 AM   Specimen: BLOOD  Result Value Ref Range Status   Specimen Description BLOOD LEFT ANTECUBITAL  Final   Special Requests   Final    BOTTLES DRAWN AEROBIC ONLY Blood Culture adequate volume   Culture   Final    NO GROWTH 3 DAYS Performed at [facility], [place] [address], [place], [place] [address]    Report Status PENDING  Incomplete      Radiology Studies: No results found.  Scheduled Meds: Continuous Infusions:  morphine 5 mg/hr (09/04/22 0339)     LOS: 3 days   Time spent: 15 minutes   Reon Hunley Loann Quill, MD [facility]  If 7PM-7AM, please contact night-coverage [email or web address] 09/04/2022, 9:47 AM

## 2022-09-05 DIAGNOSIS — J189 Pneumonia, unspecified organism: Secondary | ICD-10-CM | POA: Diagnosis not present

## 2022-09-05 LAB — TYPE AND SCREEN
ABO/RH(D): A POS
Antibody Screen: NEGATIVE
Unit division: 0
Unit division: 0
Unit division: 0
Unit division: 0

## 2022-09-05 LAB — BPAM RBC
Blood Product Expiration Date: 202401172359
Blood Product Expiration Date: 202401172359
Blood Product Expiration Date: 202401172359
Blood Product Expiration Date: 202401172359
Unit Type and Rh: 6200
Unit Type and Rh: 6200
Unit Type and Rh: 6200
Unit Type and Rh: 6200

## 2022-09-05 NOTE — Progress Notes (Signed)
PROGRESS NOTE    Mark Howe  HYW:[id number] DOB: [birth year] DOA: 08/31/2022 PCP: [name], MD   Brief Narrative:  87 year old SNF resident with a history of HTN, HLD, BPH, dementia, CKD stage III, and admission to the hospital November 2023 with prosthetic joint infection and COVID-pneumonia who returned to the ER with lethargy and hypotension. Staff at his SNF reported that he was found with difficulty breathing, gurgling, with oxygen saturations as low as 75%. The family had noted worsening of his dementia in the preceding month. In the ED he was found to have a hemoglobin of 4.1, though follow-up labs suggest this was likely an error with a hemoglobin of 10 more consistent with his baseline. There is no obvious source of blood loss. Chest x-ray suggested an infiltrate.   Assessment & Plan:   End of life Care: -At the time of his admission discussion was held with the family and all parties agreed that the most appropriate treatment plan for this patient would be to focus on comfort -Palliative care consulted.  Placed on comfort measures.  Pain control with morphine drip   Right lower lobe pneumonia  Anemia of chronic disease Advanced dementia Left hip prosthetic joint infection Hypertension Hyperlipidemia BPH Dementia CKD stage III Diastolic dysfunction   DVT prophylaxis: None Code Status: DNR Family Communication:  None present at bedside.    Consultants:  Palliative care  Procedures:  None  Antimicrobials:  None  Status is: Inpatient   Subjective: Patient seen and examined.  Appears to be resting comfortably on the bed.  No family at bedside.  No acute events overnight.  Objective: Vitals:   09/02/22 2345 09/03/22 1611 09/03/22 1934 09/05/22 0737  BP: (!) 83/48 104/80 (!) 100/43 (!) 136/49  Pulse: 73 70 87 69  Resp:  14 (!) 9 12  Temp:  (!) 101 F (38.3 C) 99.5 F (37.5 C) 98.8 F (37.1 C)  TempSrc:  Oral Oral Oral  SpO2: 92% 96% 96% 90%   Weight:      Height:       No intake or output data in the 24 hours ending 09/05/22 1037  Filed Weights   08/22/2022 1121  Weight: 93.4 kg    Examination:  General exam: Appears calm and comfortable, resting, not in any respiratory distress.  Data Reviewed: I have personally reviewed following labs and imaging studies  CBC: Recent Labs  Lab 09/05/2022 1103 09/09/2022 1314  WBC 13.9*  --   NEUTROABS 10.4*  --   HGB 4.1* 10.3*  HCT 13.7* 33.0*  MCV 108.7*  --   PLT 297  --     Basic Metabolic Panel: Recent Labs  Lab 08/15/2022 1103  NA 143  K 4.1  CL 115*  CO2 22  GLUCOSE 83  BUN 32*  CREATININE 1.26*  CALCIUM 7.4*    GFR: Estimated Creatinine Clearance: 47.5 mL/min (A) (by C-G formula based on SCr of 1.26 mg/dL (H)). Liver Function Tests: Recent Labs  Lab 08/20/2022 1103  AST 17  ALT 13  ALKPHOS 37*  BILITOT 0.3  PROT 4.5*  ALBUMIN 1.5*    No results for input(s): "LIPASE", "AMYLASE" in the last 168 hours. No results for input(s): "AMMONIA" in the last 168 hours. Coagulation Profile: Recent Labs  Lab 08/30/2022 1103  INR 1.2    Cardiac Enzymes: No results for input(s): "CKTOTAL", "CKMB", "CKMBINDEX", "TROPONINI" in the last 168 hours. BNP (last 3 results) No results for input(s): "PROBNP" in the last 8760  hours. HbA1C: No results for input(s): "HGBA1C" in the last 72 hours. CBG: No results for input(s): "GLUCAP" in the last 168 hours. Lipid Profile: No results for input(s): "CHOL", "HDL", "LDLCALC", "TRIG", "CHOLHDL", "LDLDIRECT" in the last 72 hours. Thyroid Function Tests: No results for input(s): "TSH", "T4TOTAL", "FREET4", "T3FREE", "THYROIDAB" in the last 72 hours. Anemia Panel: No results for input(s): "VITAMINB12", "FOLATE", "FERRITIN", "TIBC", "IRON", "RETICCTPCT" in the last 72 hours. Sepsis Labs: Recent Labs  Lab 09/09/2022 1103 08/28/2022 1314  LATICACIDVEN 1.4 1.4     Recent Results (from the past 240 hour(s))  Resp panel by  RT-PCR (RSV, Flu A&B, Covid) Anterior Nasal Swab     Status: None   Collection Time: 09/10/2022 10:52 AM   Specimen: Anterior Nasal Swab  Result Value Ref Range Status   SARS Coronavirus 2 by RT PCR NEGATIVE NEGATIVE Final    Comment: (NOTE) SARS-CoV-2 target nucleic acids are NOT DETECTED.  The SARS-CoV-2 RNA is generally detectable in upper respiratory specimens during the acute phase of infection. The lowest concentration of SARS-CoV-2 viral copies this assay can detect is 138 copies/mL. A negative result does not preclude SARS-Cov-2 infection and should not be used as the sole basis for treatment or other patient management decisions. A negative result may occur with  improper specimen collection/handling, submission of specimen other than nasopharyngeal swab, presence of viral mutation(s) within the areas targeted by this assay, and inadequate number of viral copies(<138 copies/mL). A negative result must be combined with clinical observations, patient history, and epidemiological information. The expected result is Negative.  Fact Sheet for Patients:  EntrepreneurPulse.com.au  Fact Sheet for Healthcare Providers:  IncredibleEmployment.be  This test is no t yet approved or cleared by the [place] FDA and  has been authorized for detection and/or diagnosis of SARS-CoV-2 by FDA under an Emergency Use Authorization (EUA). This EUA will remain  in effect (meaning this test can be used) for the duration of the COVID-19 declaration under Section 564(b)(1) of the Act, 21 U.S.C.section 360bbb-3(b)(1), unless the authorization is terminated  or revoked sooner.       Influenza A by PCR NEGATIVE NEGATIVE Final   Influenza B by PCR NEGATIVE NEGATIVE Final    Comment: (NOTE) The Xpert Xpress SARS-CoV-2/FLU/RSV plus assay is intended as an aid in the diagnosis of influenza from Nasopharyngeal swab specimens and should not be used as a sole basis  for treatment. Nasal washings and aspirates are unacceptable for Xpert Xpress SARS-CoV-2/FLU/RSV testing.  Fact Sheet for Patients: EntrepreneurPulse.com.au  Fact Sheet for Healthcare Providers: IncredibleEmployment.be  This test is not yet approved or cleared by the [place] FDA and has been authorized for detection and/or diagnosis of SARS-CoV-2 by FDA under an Emergency Use Authorization (EUA). This EUA will remain in effect (meaning this test can be used) for the duration of the COVID-19 declaration under Section 564(b)(1) of the Act, 21 U.S.C. section 360bbb-3(b)(1), unless the authorization is terminated or revoked.     Resp Syncytial Virus by PCR NEGATIVE NEGATIVE Final    Comment: (NOTE) Fact Sheet for Patients: EntrepreneurPulse.com.au  Fact Sheet for Healthcare Providers: IncredibleEmployment.be  This test is not yet approved or cleared by the [place] FDA and has been authorized for detection and/or diagnosis of SARS-CoV-2 by FDA under an Emergency Use Authorization (EUA). This EUA will remain in effect (meaning this test can be used) for the duration of the COVID-19 declaration under Section 564(b)(1) of the Act, 21 U.S.C. section 360bbb-3(b)(1), unless the authorization  is terminated or revoked.  Performed at [facility], [place] [address], [place], [place] [address]   Blood Culture (routine x 2)     Status: None (Preliminary result)   Collection Time: 09/03/2022 10:56 AM   Specimen: BLOOD LEFT ARM  Result Value Ref Range Status   Specimen Description BLOOD LEFT ARM  Final   Special Requests   Final    BOTTLES DRAWN AEROBIC ONLY Blood Culture adequate volume   Culture   Final    NO GROWTH 4 DAYS Performed at [facility], [place] [address], [place], [place] [address]    Report Status PENDING  Incomplete  Blood Culture (routine x 2)     Status: None (Preliminary result)    Collection Time: 08/13/2022 11:23 AM   Specimen: BLOOD  Result Value Ref Range Status   Specimen Description BLOOD LEFT ANTECUBITAL  Final   Special Requests   Final    BOTTLES DRAWN AEROBIC ONLY Blood Culture adequate volume   Culture   Final    NO GROWTH 4 DAYS Performed at [facility], [place] [address], [place], [place] [address]    Report Status PENDING  Incomplete      Radiology Studies: No results found.  Scheduled Meds: Continuous Infusions:  morphine 5 mg/hr (09/05/22 0815)     LOS: 4 days   Time spent: 15 minutes   Cantrell Larouche Loann Quill, MD [facility]  If 7PM-7AM, please contact night-coverage [email or web address] 09/05/2022, 10:37 AM

## 2022-09-06 DIAGNOSIS — J189 Pneumonia, unspecified organism: Secondary | ICD-10-CM | POA: Diagnosis not present

## 2022-09-06 LAB — CULTURE, BLOOD (ROUTINE X 2)
Culture: NO GROWTH
Culture: NO GROWTH
Special Requests: ADEQUATE
Special Requests: ADEQUATE

## 2022-09-06 NOTE — Progress Notes (Signed)
PROGRESS NOTE    Mark Howe  DPO:[id number] DOB: [birth year] DOA: 09/06/2022 PCP: [name], MD   Brief Narrative:  87 year old SNF resident with a history of HTN, HLD, BPH, dementia, CKD stage III, and admission to the hospital November 2023 with prosthetic joint infection and COVID-pneumonia who returned to the ER with lethargy and hypotension. Staff at his SNF reported that he was found with difficulty breathing, gurgling, with oxygen saturations as low as 75%. The family had noted worsening of his dementia in the preceding month. In the ED he was found to have a hemoglobin of 4.1, though follow-up labs suggest this was likely an error with a hemoglobin of 10 more consistent with his baseline. There is no obvious source of blood loss. Chest x-ray suggested an infiltrate.   Assessment & Plan:   End of life Care: -At the time of his admission discussion was held with the family and all parties agreed that the most appropriate treatment plan for this patient would be to focus on comfort -Palliative care consulted.  Placed on comfort measures.  Pain control with morphine drip   Right lower lobe pneumonia  Anemia of chronic disease Advanced dementia Left hip prosthetic joint infection Hypertension Hyperlipidemia BPH Dementia CKD stage III Diastolic dysfunction   DVT prophylaxis: None Code Status: DNR Family Communication: Patient's sister present at the bedside.  Plan of care discussed.  Consultants:  Palliative care  Procedures:  None  Antimicrobials:  None  Status is: Inpatient   Subjective: Patient seen and examined.  Appears to be resting comfortably on the bed.  Sister at the bedside.  Has more pauses in the breathing and has gurgling sounds.  No acute events overnight. Objective: Vitals:   09/03/22 1611 09/03/22 1934 09/05/22 0737 09/06/22 0546  BP: 104/80 (!) 100/43 (!) 136/49 (!) 108/55  Pulse: 70 87 69 88  Resp: 14 (!) '9 12 15  '$ Temp: (!) 101 F  (38.3 C) 99.5 F (37.5 C) 98.8 F (37.1 C) (!) 100.4 F (38 C)  TempSrc: Oral Oral Oral Oral  SpO2: 96% 96% 90% 95%  Weight:      Height:        Intake/Output Summary (Last 24 hours) at 09/06/2022 1056 Last data filed at 09/06/2022 0600 Gross per 24 hour  Intake 207.63 ml  Output 50 ml  Net 157.63 ml    Filed Weights   09/11/2022 1121  Weight: 93.4 kg    Examination:  General exam: Appears calm and comfortable, resting, not in any respiratory distress.  Data Reviewed: I have personally reviewed following labs and imaging studies  CBC: Recent Labs  Lab 08/29/2022 1103 09/04/2022 1314  WBC 13.9*  --   NEUTROABS 10.4*  --   HGB 4.1* 10.3*  HCT 13.7* 33.0*  MCV 108.7*  --   PLT 297  --     Basic Metabolic Panel: Recent Labs  Lab 08/17/2022 1103  NA 143  K 4.1  CL 115*  CO2 22  GLUCOSE 83  BUN 32*  CREATININE 1.26*  CALCIUM 7.4*    GFR: Estimated Creatinine Clearance: 47.5 mL/min (A) (by C-G formula based on SCr of 1.26 mg/dL (H)). Liver Function Tests: Recent Labs  Lab 08/17/2022 1103  AST 17  ALT 13  ALKPHOS 37*  BILITOT 0.3  PROT 4.5*  ALBUMIN 1.5*    No results for input(s): "LIPASE", "AMYLASE" in the last 168 hours. No results for input(s): "AMMONIA" in the last 168 hours. Coagulation Profile: Recent  Labs  Lab 09/06/2022 1103  INR 1.2    Cardiac Enzymes: No results for input(s): "CKTOTAL", "CKMB", "CKMBINDEX", "TROPONINI" in the last 168 hours. BNP (last 3 results) No results for input(s): "PROBNP" in the last 8760 hours. HbA1C: No results for input(s): "HGBA1C" in the last 72 hours. CBG: No results for input(s): "GLUCAP" in the last 168 hours. Lipid Profile: No results for input(s): "CHOL", "HDL", "LDLCALC", "TRIG", "CHOLHDL", "LDLDIRECT" in the last 72 hours. Thyroid Function Tests: No results for input(s): "TSH", "T4TOTAL", "FREET4", "T3FREE", "THYROIDAB" in the last 72 hours. Anemia Panel: No results for input(s): "VITAMINB12",  "FOLATE", "FERRITIN", "TIBC", "IRON", "RETICCTPCT" in the last 72 hours. Sepsis Labs: Recent Labs  Lab 08/24/2022 1103 08/27/2022 1314  LATICACIDVEN 1.4 1.4     Recent Results (from the past 240 hour(s))  Resp panel by RT-PCR (RSV, Flu A&B, Covid) Anterior Nasal Swab     Status: None   Collection Time: 08/31/2022 10:52 AM   Specimen: Anterior Nasal Swab  Result Value Ref Range Status   SARS Coronavirus 2 by RT PCR NEGATIVE NEGATIVE Final    Comment: (NOTE) SARS-CoV-2 target nucleic acids are NOT DETECTED.  The SARS-CoV-2 RNA is generally detectable in upper respiratory specimens during the acute phase of infection. The lowest concentration of SARS-CoV-2 viral copies this assay can detect is 138 copies/mL. A negative result does not preclude SARS-Cov-2 infection and should not be used as the sole basis for treatment or other patient management decisions. A negative result may occur with  improper specimen collection/handling, submission of specimen other than nasopharyngeal swab, presence of viral mutation(s) within the areas targeted by this assay, and inadequate number of viral copies(<138 copies/mL). A negative result must be combined with clinical observations, patient history, and epidemiological information. The expected result is Negative.  Fact Sheet for Patients:  EntrepreneurPulse.com.au  Fact Sheet for Healthcare Providers:  IncredibleEmployment.be  This test is no t yet approved or cleared by the [place] FDA and  has been authorized for detection and/or diagnosis of SARS-CoV-2 by FDA under an Emergency Use Authorization (EUA). This EUA will remain  in effect (meaning this test can be used) for the duration of the COVID-19 declaration under Section 564(b)(1) of the Act, 21 U.S.C.section 360bbb-3(b)(1), unless the authorization is terminated  or revoked sooner.       Influenza A by PCR NEGATIVE NEGATIVE Final   Influenza B  by PCR NEGATIVE NEGATIVE Final    Comment: (NOTE) The Xpert Xpress SARS-CoV-2/FLU/RSV plus assay is intended as an aid in the diagnosis of influenza from Nasopharyngeal swab specimens and should not be used as a sole basis for treatment. Nasal washings and aspirates are unacceptable for Xpert Xpress SARS-CoV-2/FLU/RSV testing.  Fact Sheet for Patients: EntrepreneurPulse.com.au  Fact Sheet for Healthcare Providers: IncredibleEmployment.be  This test is not yet approved or cleared by the [place] FDA and has been authorized for detection and/or diagnosis of SARS-CoV-2 by FDA under an Emergency Use Authorization (EUA). This EUA will remain in effect (meaning this test can be used) for the duration of the COVID-19 declaration under Section 564(b)(1) of the Act, 21 U.S.C. section 360bbb-3(b)(1), unless the authorization is terminated or revoked.     Resp Syncytial Virus by PCR NEGATIVE NEGATIVE Final    Comment: (NOTE) Fact Sheet for Patients: EntrepreneurPulse.com.au  Fact Sheet for Healthcare Providers: IncredibleEmployment.be  This test is not yet approved or cleared by the [place] FDA and has been authorized for detection and/or diagnosis of SARS-CoV-2 by  FDA under an Emergency Use Authorization (EUA). This EUA will remain in effect (meaning this test can be used) for the duration of the COVID-19 declaration under Section 564(b)(1) of the Act, 21 U.S.C. section 360bbb-3(b)(1), unless the authorization is terminated or revoked.  Performed at [facility], [place] [address], [place], [place] [address]   Blood Culture (routine x 2)     Status: None   Collection Time: 08/21/2022 10:56 AM   Specimen: BLOOD LEFT ARM  Result Value Ref Range Status   Specimen Description BLOOD LEFT ARM  Final   Special Requests   Final    BOTTLES DRAWN AEROBIC ONLY Blood Culture adequate volume   Culture   Final     NO GROWTH 5 DAYS Performed at [facility], [place] [address], [place], [place] [address]    Report Status 09/06/2022 FINAL  Final  Blood Culture (routine x 2)     Status: None   Collection Time: 08/19/2022 11:23 AM   Specimen: BLOOD  Result Value Ref Range Status   Specimen Description BLOOD LEFT ANTECUBITAL  Final   Special Requests   Final    BOTTLES DRAWN AEROBIC ONLY Blood Culture adequate volume   Culture   Final    NO GROWTH 5 DAYS Performed at [facility], [place] [address] [place], [place] [address]    Report Status 09/06/2022 FINAL  Final      Radiology Studies: No results found.  Scheduled Meds: Continuous Infusions:  morphine 5 mg/hr (09/06/22 0033)     LOS: 5 days   Time spent: 15 minutes   Laquinta Hazell Loann Quill, MD [facility]  If 7PM-7AM, please contact night-coverage [email or web address] 09/06/2022, 10:56 AM

## 2022-09-13 NOTE — Progress Notes (Signed)
    OVERNIGHT PROGRESS REPORT  Notified by RN that patient has expired at 0230 hrs.  Patient was DNR/CMO (comfort measures only) followed by Palliative.  2 RN verified.  Family notified by Nursing staff.     Gershon Cull MSNA ACNPC-AG Acute Care Nurse Practitioner [place]

## 2022-09-13 NOTE — Death Summary Note (Signed)
Death Summary  Mark Howe SHF:[id number] DOB: Nov 21, 1934 DOA: 09/04/22  PCP: [name], MD PCP/Office notified: no  Admit date: 2022-09-04 Date of Death: 2022-09-10  Final Diagnoses:  End-of-life care Right lower lobe pneumonia Anemia of chronic disease Advanced dementia Left hip prosthetic joint infection-chronic Hypertension Hyperlipidemia BPH CKD stage III A Diastolic dysfunction Osteoarthritis Personal history of skin cancer Aortic atherosclerosis  History of present illness:  88 year old SNF resident with a history of HTN, HLD, BPH, dementia, CKD stage III, and admission to the hospital November 2023 with prosthetic joint infection and COVID-pneumonia who returned to the ER with lethargy and hypotension. Staff at his SNF reported that he was found with difficulty breathing, gurgling, with oxygen saturations as low as 75%. The family had noted worsening of his dementia in the preceding month. In the ED he was found to have a hemoglobin of 4.1, though follow-up labs suggest this was likely an error with a hemoglobin of 10 more consistent with his baseline. There was no obvious source of blood loss. Chest x-ray suggested an infiltrate. At the time of admission discussion was held with the family and all parties agreed with the appropriate treatment plan for this patient would be focused on comfort care..  If care consulted and patient admitted in the hospital.  Hospital Course:  Patient admitted in the hospital for end-of-life care.  Patient placed on comfort measures.  Palliative was consulted.  Patient died peacefully on 10-Sep-2022 at 2:30 AM.  Family notified by nursing staff.   Time of death: 12:30 AM  Signed:  Mckinley Jewel  [facility] 2022/09/10, 1:24 PM

## 2022-09-13 DEATH — deceased

## 2022-09-14 ENCOUNTER — Ambulatory Visit: Payer: Medicare Other | Admitting: Internal Medicine

## 2023-11-10 IMAGING — CR DG CHEST 1V
1 series · 1 of 1 positions shown · non-contrast
Comparison: November 25, 2011.

CLINICAL DATA: Unwitnessed fall.

EXAM:
CHEST  1 VIEW

[chest ap]
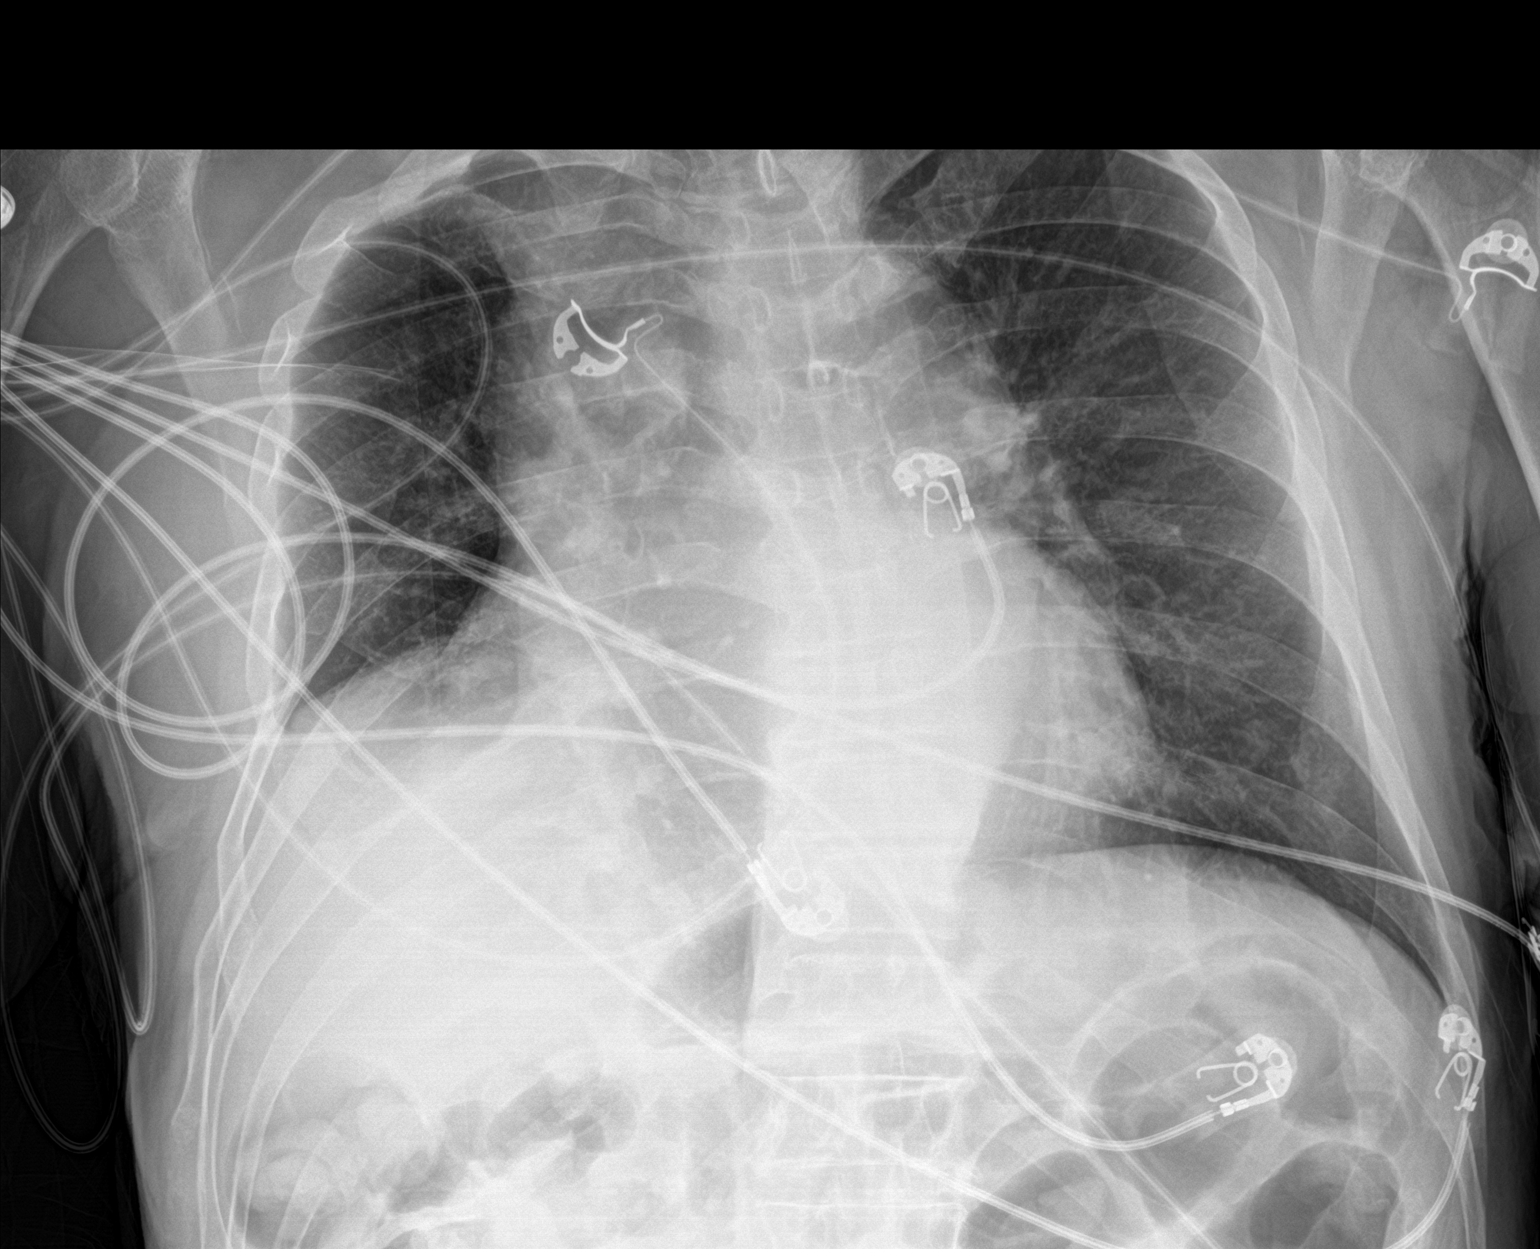

[1 of 1 positions shown; findings below may reference images not displayed]

FINDINGS: Stable cardiomediastinal silhouette. Elevated right hemidiaphragm is
noted. Both lungs are clear. The visualized skeletal structures are
unremarkable.
IMPRESSION: No active disease.

## 2023-11-10 IMAGING — CR DG HIP (WITH OR WITHOUT PELVIS) 2-3V*L*
3 series · 3 of 3 positions shown · non-contrast
Comparison: November 24, 2011.

CLINICAL DATA: Left hip pain after fall.

EXAM:
DG HIP (WITH OR WITHOUT PELVIS) 2-3V LEFT

[pelvis ap]
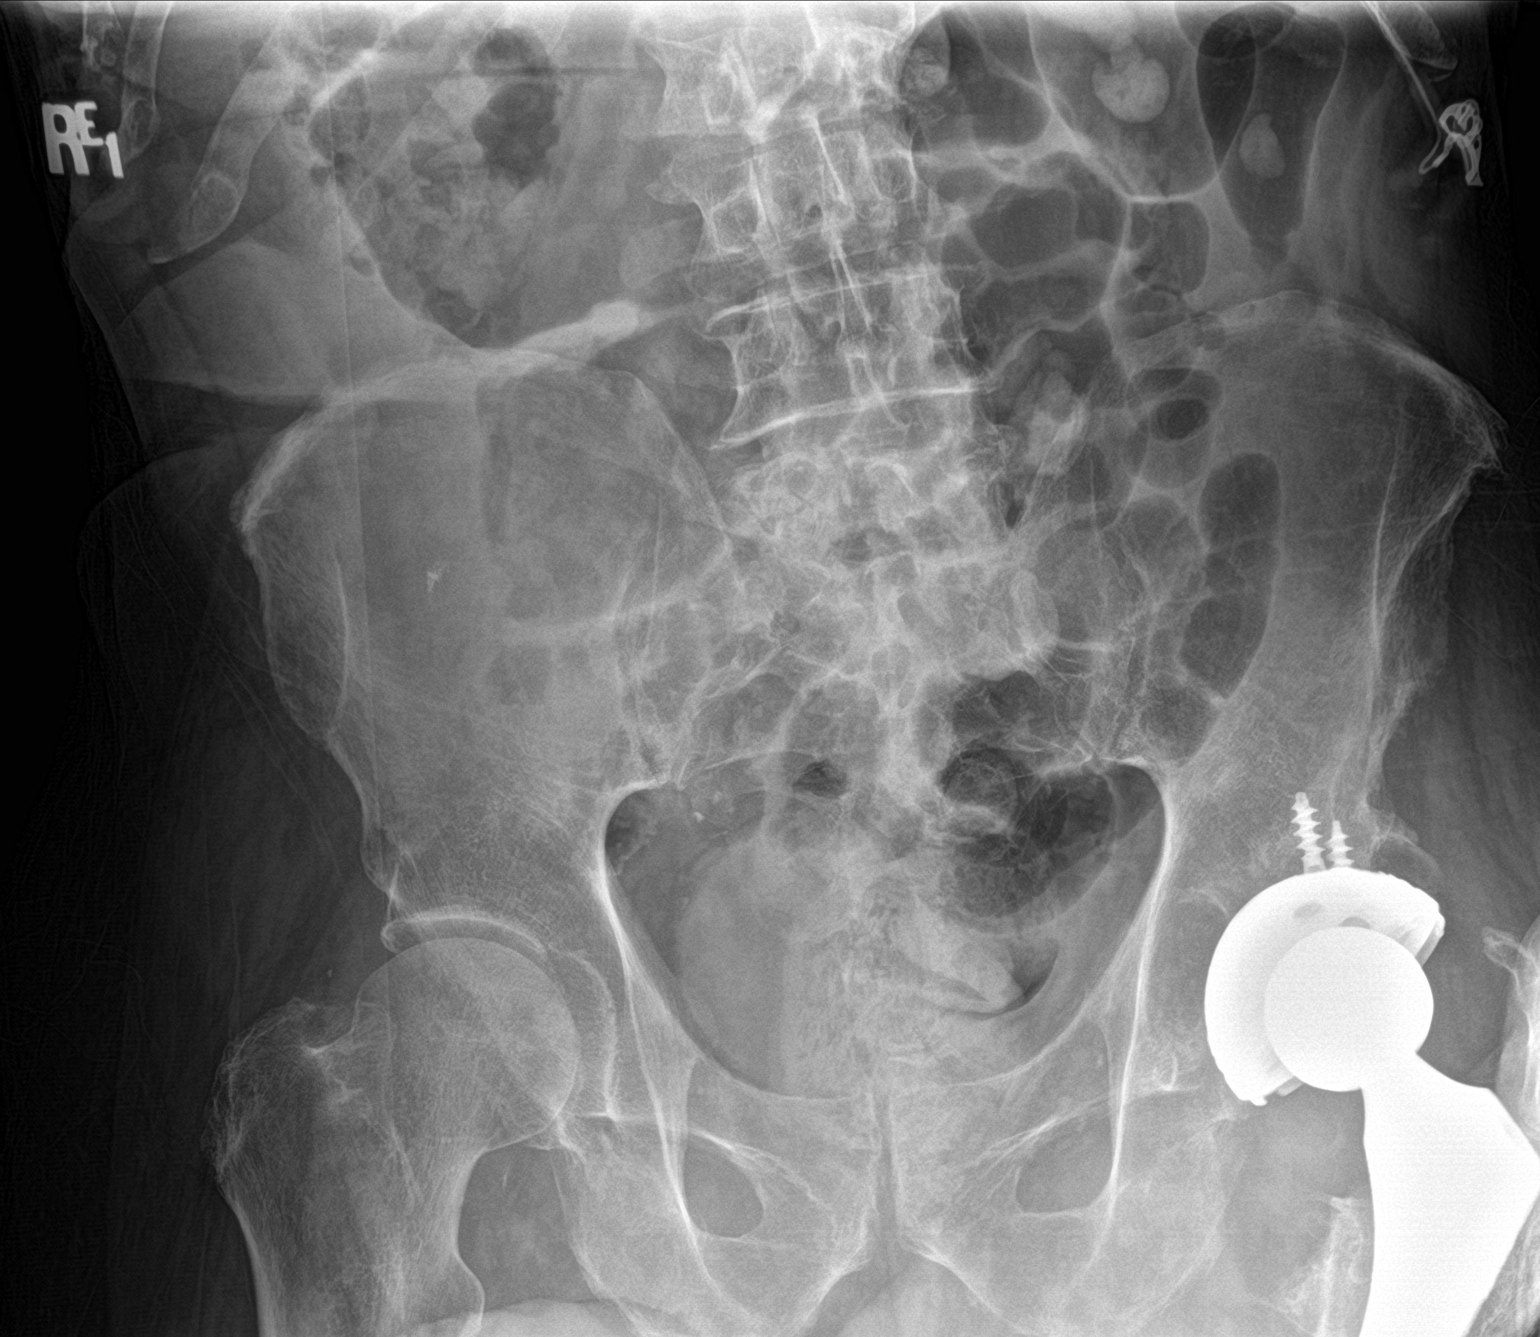

[hip lat]
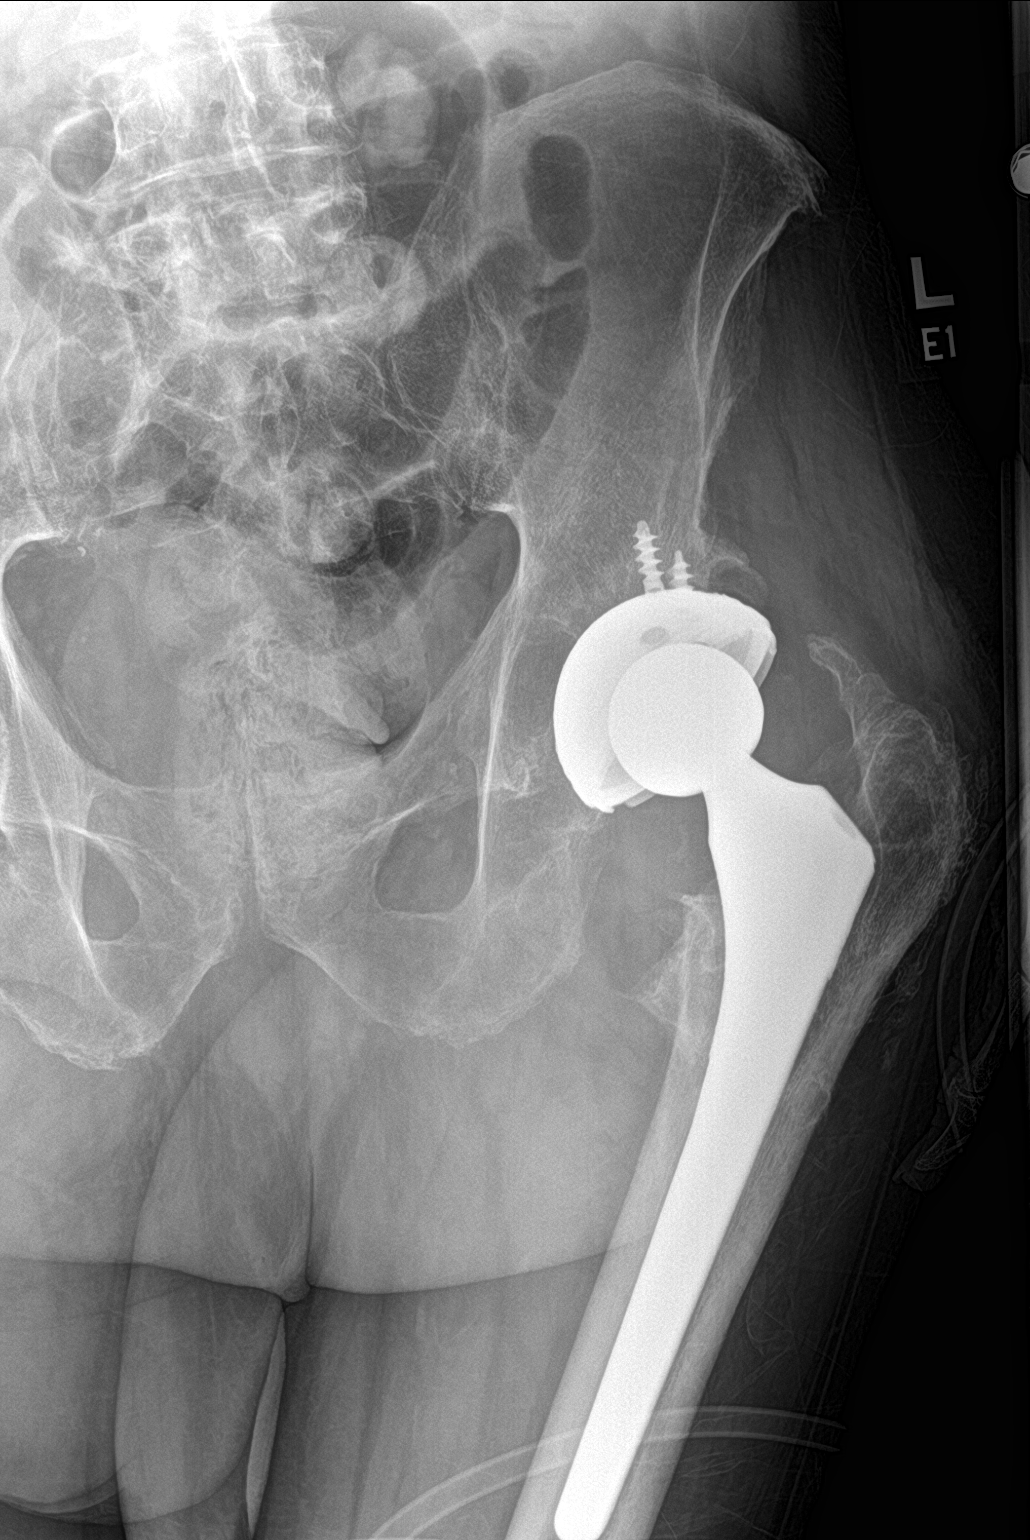

[hip x-table]
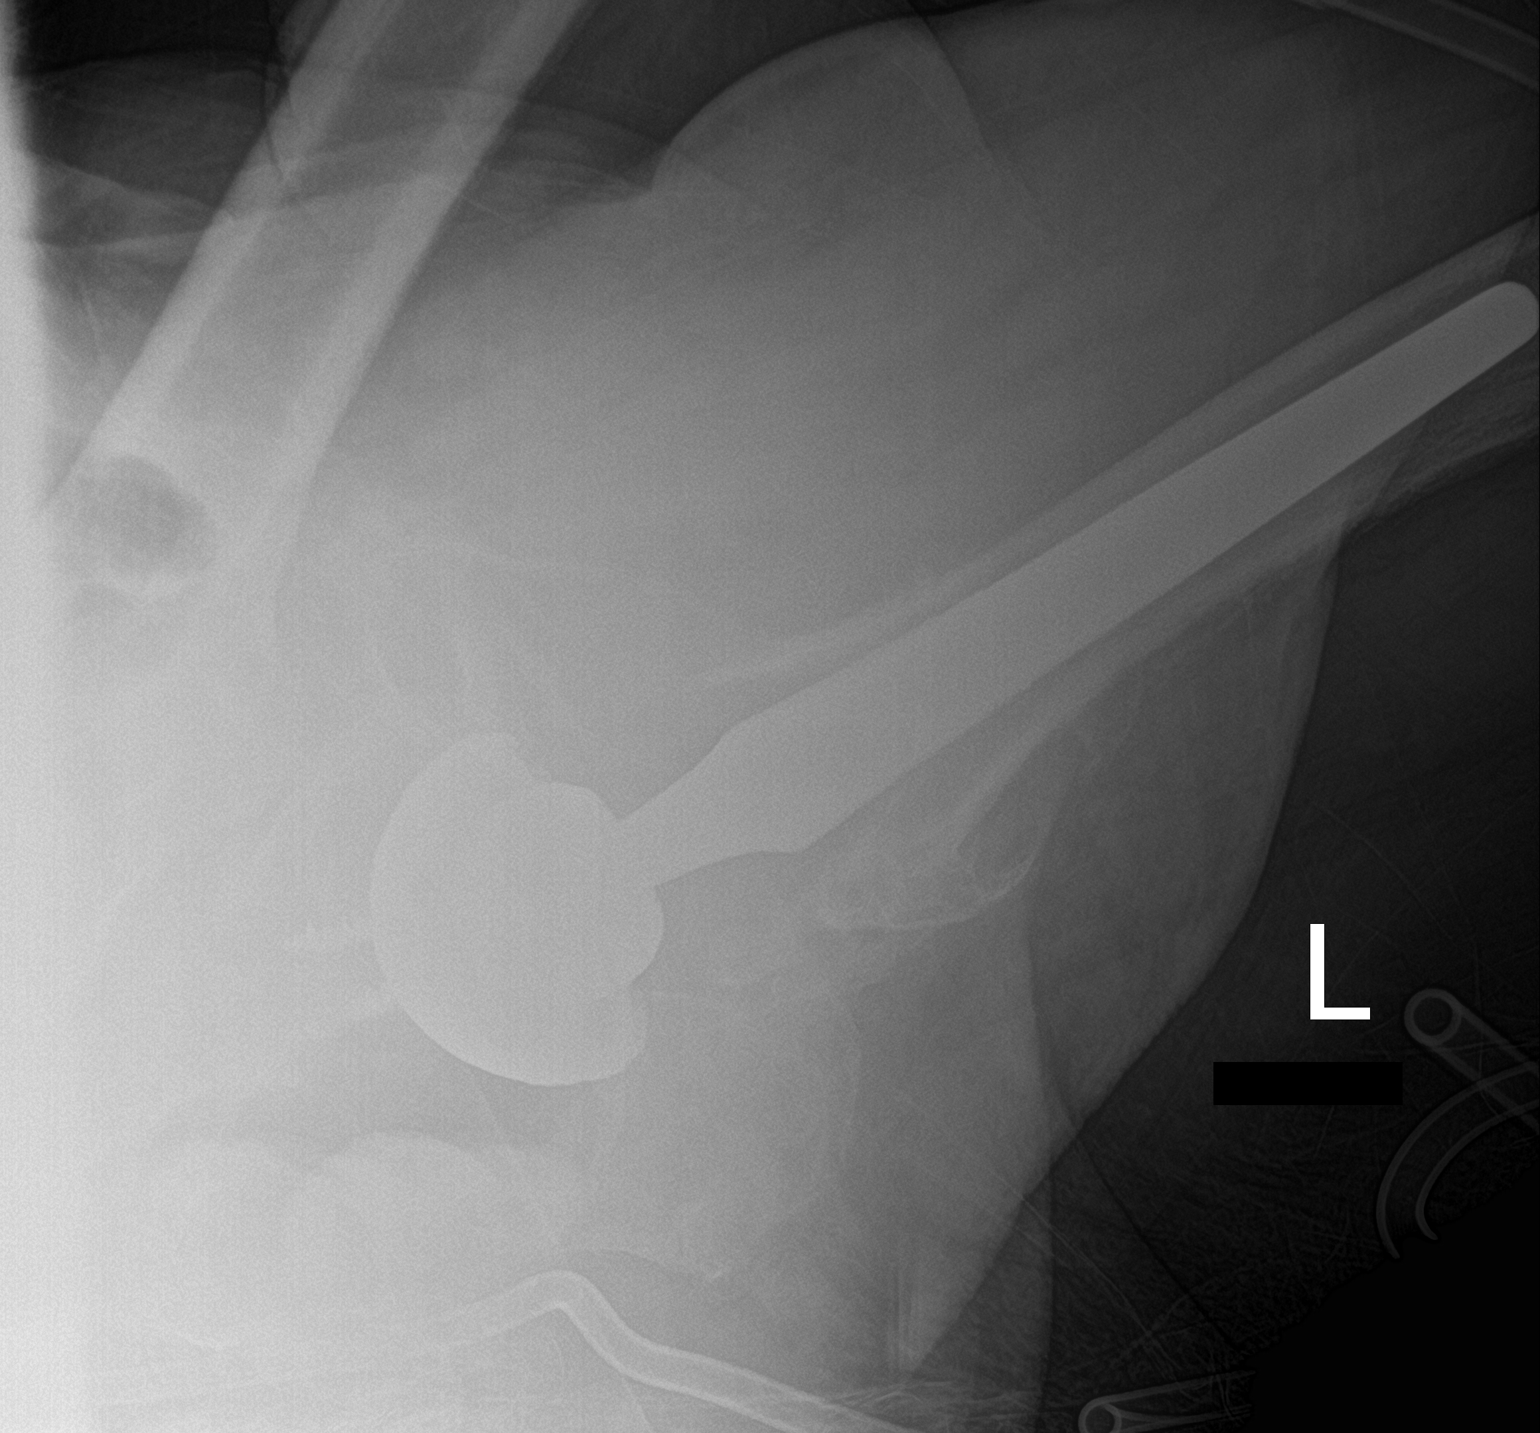

[3 of 3 positions shown; findings below may reference images not displayed]

FINDINGS: Status post left total hip arthroplasty. No acute fracture or
dislocation is noted.
IMPRESSION: No acute abnormality seen.

## 2023-11-10 IMAGING — CT CT HEAD W/O CM
3 of 4 series · 16 of 47 positions shown, 19 images · non-contrast
Comparison: None.

CLINICAL DATA: Altered mental status



[Series 3: head 5.0 h30s · axial · 0.43mm/px · z∈[-583,-438]mm · 10 of 35 slices shown, 13 images]
[im 3/35  brain]
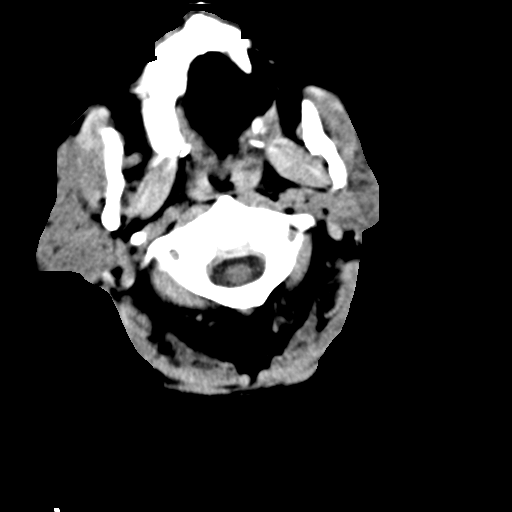
[im 3/35  bone]
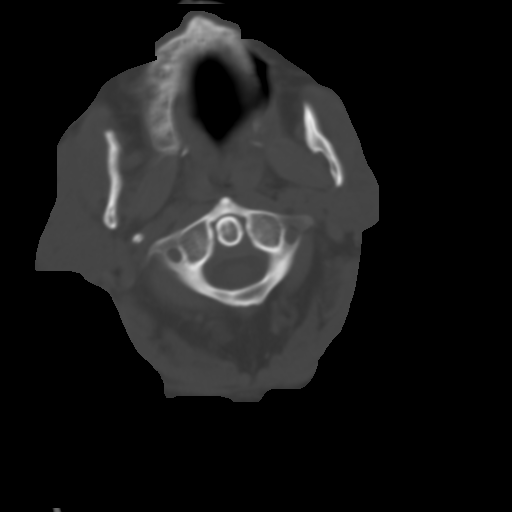
[im 5/35  brain]
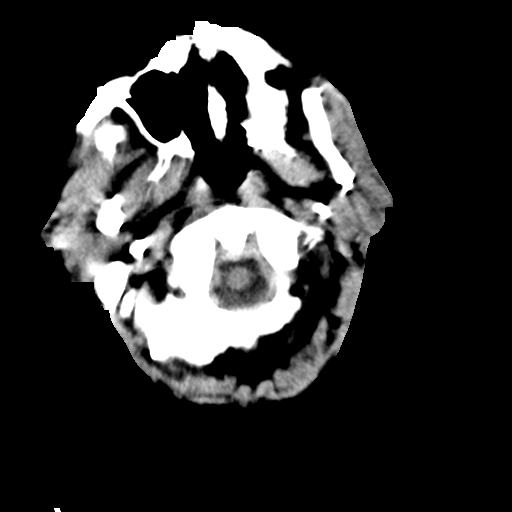
[im 10/35  brain]
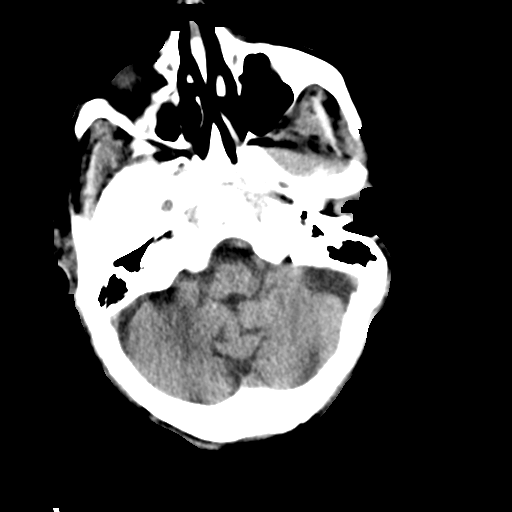
[im 13/35  brain]
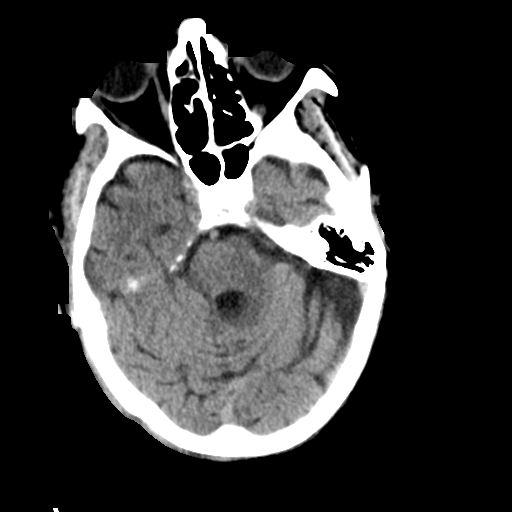
[im 15/35  brain]
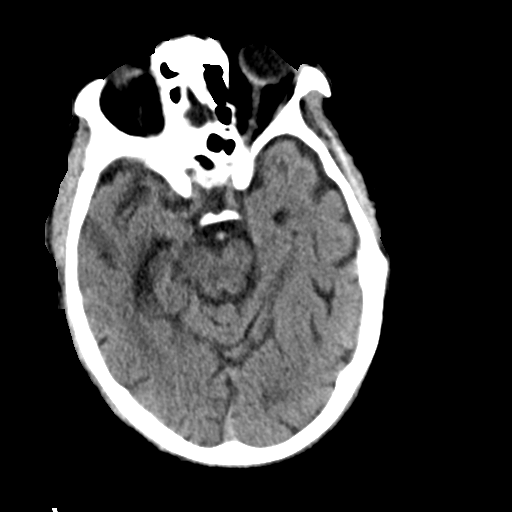
[im 15/35  bone]
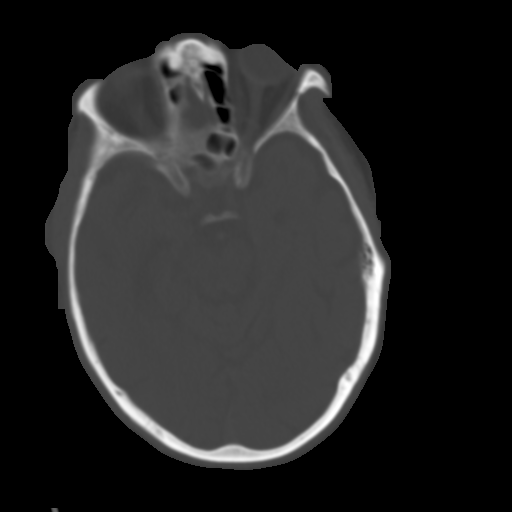
[im 20/35  brain]
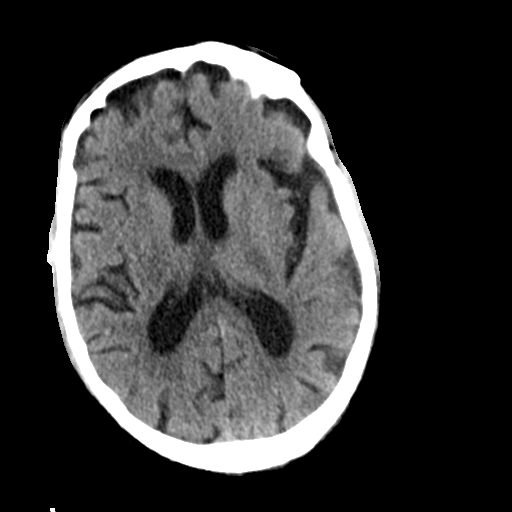
[im 22/35  brain]
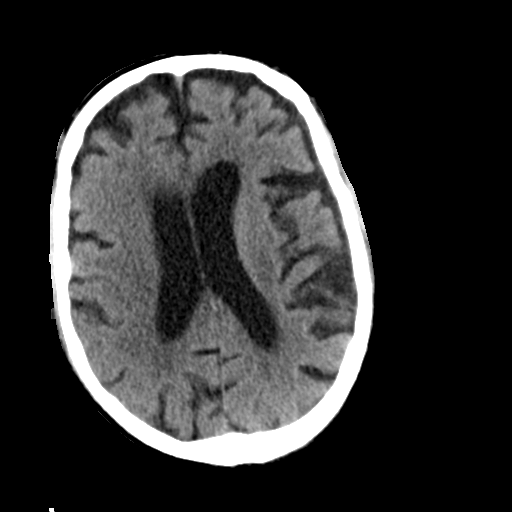
[im 25/35  brain]
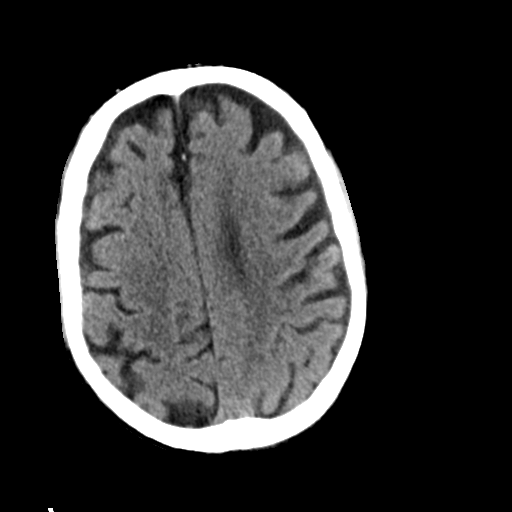
[im 30/35  brain]
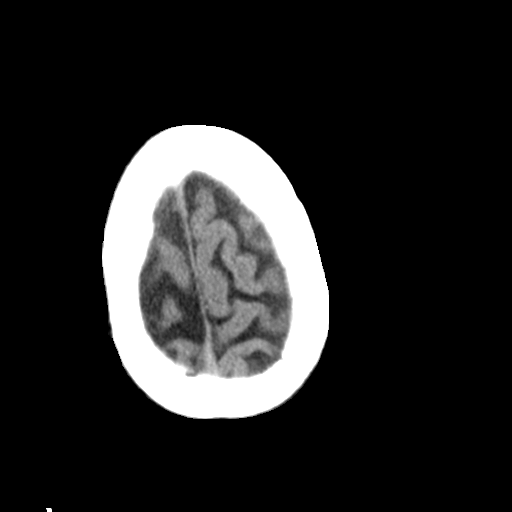
[im 30/35  bone]
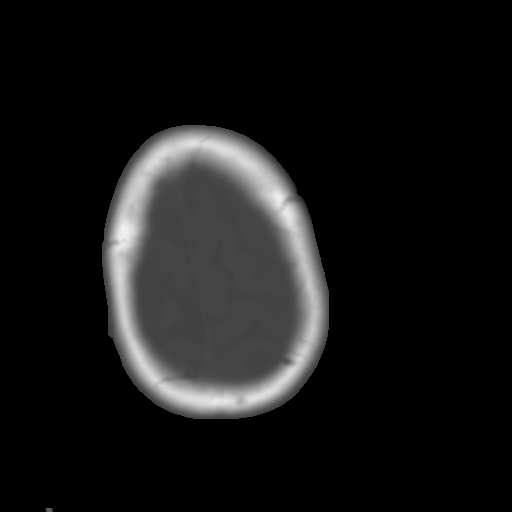
[im 32/35  brain]
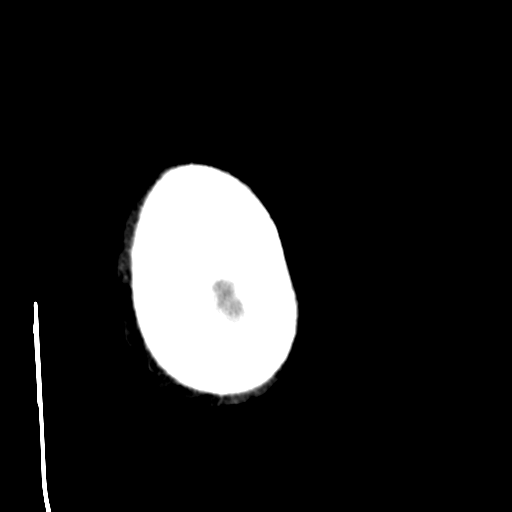

[Series 5: head 3.0 mpr cor · coronal · 0.35mm/px · 3 of 73 slices shown]
[im 25/73  brain]
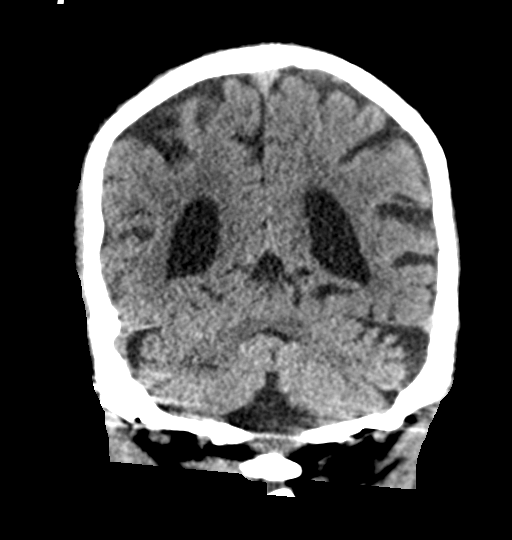
[im 33/73  brain]
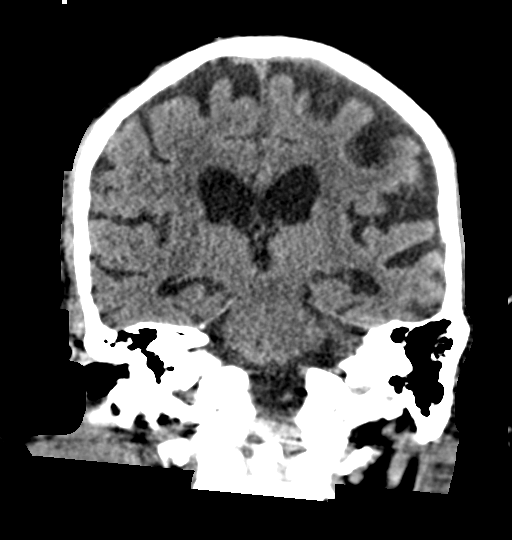
[im 41/73  brain]
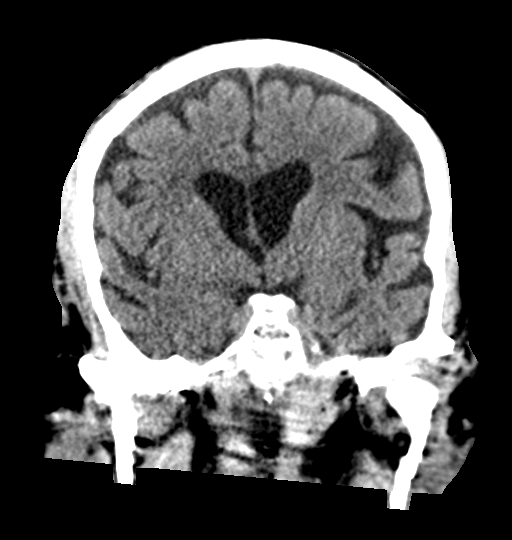

[Series 6: head 3.0 mpr sag · sagittal · 0.37mm/px · 3 of 62 slices shown]
[im 23/62  brain]
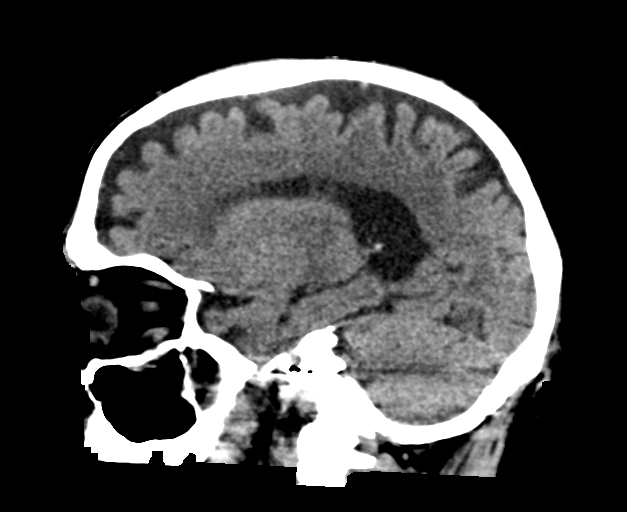
[im 31/62  brain]
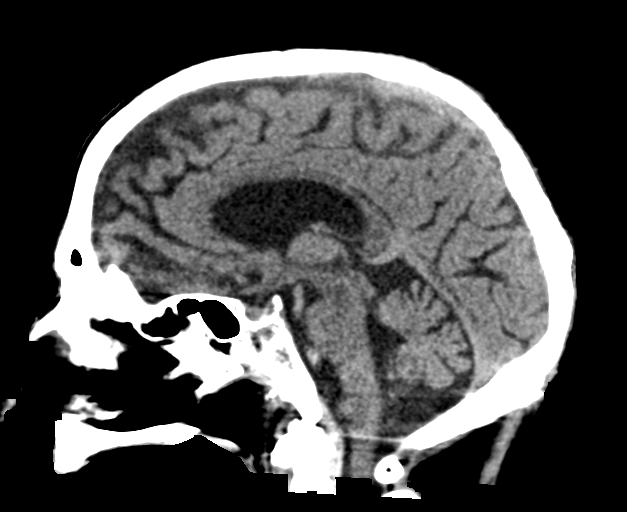
[im 39/62  brain]
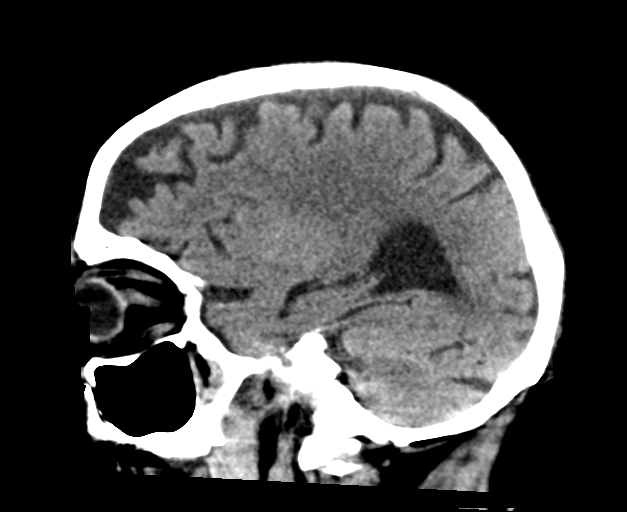

[16 of 47 positions shown; findings below may reference images not displayed]

FINDINGS: Brain: No acute intracranial findings are seen. There are no signs
of bleeding. Cortical sulci are prominent.

Vascular: Unremarkable.

Skull: Unremarkable.

Sinuses/Orbits: There is mucosal thickening in the ethmoid sinus.

Other: None
IMPRESSION: No acute intracranial findings are seen.  Atrophy.

Chronic ethmoid sinusitis.
